# Patient Record
Sex: Male | Born: 1975 | Race: White | Hispanic: No | Marital: Married | Smoking: Former smoker
Health system: Southern US, Community
[De-identification: ages and names within clinical notes are randomized; demographics above are authoritative.]

## PROBLEM LIST (undated history)

## (undated) DIAGNOSIS — M109 Gout, unspecified: Secondary | ICD-10-CM

## (undated) DIAGNOSIS — I509 Heart failure, unspecified: Secondary | ICD-10-CM

## (undated) DIAGNOSIS — I440 Atrioventricular block, first degree: Secondary | ICD-10-CM

## (undated) DIAGNOSIS — I493 Ventricular premature depolarization: Secondary | ICD-10-CM

## (undated) DIAGNOSIS — I428 Other cardiomyopathies: Secondary | ICD-10-CM

## (undated) DIAGNOSIS — Z9289 Personal history of other medical treatment: Secondary | ICD-10-CM

## (undated) DIAGNOSIS — I4892 Unspecified atrial flutter: Secondary | ICD-10-CM

## (undated) DIAGNOSIS — F419 Anxiety disorder, unspecified: Secondary | ICD-10-CM

## (undated) DIAGNOSIS — E785 Hyperlipidemia, unspecified: Secondary | ICD-10-CM

## (undated) DIAGNOSIS — I4891 Unspecified atrial fibrillation: Secondary | ICD-10-CM

## (undated) DIAGNOSIS — K219 Gastro-esophageal reflux disease without esophagitis: Secondary | ICD-10-CM

## (undated) DIAGNOSIS — I471 Supraventricular tachycardia: Secondary | ICD-10-CM

## (undated) DIAGNOSIS — R590 Localized enlarged lymph nodes: Secondary | ICD-10-CM

## (undated) DIAGNOSIS — I4719 Other supraventricular tachycardia: Secondary | ICD-10-CM

## (undated) HISTORY — DX: Unspecified atrial fibrillation: I48.91

## (undated) HISTORY — DX: Hyperlipidemia, unspecified: E78.5

## (undated) HISTORY — DX: Gout, unspecified: M10.9

## (undated) HISTORY — PX: ROOT CANAL: SHX2363

## (undated) HISTORY — DX: Other supraventricular tachycardia: I47.19

## (undated) HISTORY — DX: Localized enlarged lymph nodes: R59.0

## (undated) HISTORY — PX: PACEMAKER IMPLANT: EP1218

## (undated) HISTORY — PX: CARDIAC DEFIBRILLATOR PLACEMENT: SHX171

## (undated) HISTORY — DX: Other cardiomyopathies: I42.8

## (undated) HISTORY — DX: Atrioventricular block, first degree: I44.0

## (undated) HISTORY — DX: Supraventricular tachycardia: I47.1

## (undated) HISTORY — DX: Ventricular premature depolarization: I49.3

## (undated) HISTORY — DX: Personal history of other medical treatment: Z92.89

## (undated) HISTORY — DX: Unspecified atrial flutter: I48.92

## (undated) HISTORY — PX: ATRIAL FIBRILLATION ABLATION: SHX5732

## (undated) HISTORY — PX: MOUTH SURGERY: SHX715

---

## 2010-04-25 ENCOUNTER — Ambulatory Visit (HOSPITAL_COMMUNITY): Admission: RE | Admit: 2010-04-25 | Payer: Self-pay | Source: Home / Self Care | Admitting: Family Medicine

## 2010-04-25 ENCOUNTER — Ambulatory Visit: Admit: 2010-04-25 | Payer: Self-pay

## 2010-04-27 ENCOUNTER — Ambulatory Visit (HOSPITAL_COMMUNITY): Payer: BC Managed Care – PPO | Attending: Family Medicine

## 2010-04-27 DIAGNOSIS — I079 Rheumatic tricuspid valve disease, unspecified: Secondary | ICD-10-CM | POA: Insufficient documentation

## 2010-04-27 DIAGNOSIS — E785 Hyperlipidemia, unspecified: Secondary | ICD-10-CM | POA: Insufficient documentation

## 2010-04-27 DIAGNOSIS — I059 Rheumatic mitral valve disease, unspecified: Secondary | ICD-10-CM | POA: Insufficient documentation

## 2010-04-27 DIAGNOSIS — R9431 Abnormal electrocardiogram [ECG] [EKG]: Secondary | ICD-10-CM | POA: Insufficient documentation

## 2010-07-18 ENCOUNTER — Other Ambulatory Visit: Payer: Self-pay | Admitting: Family Medicine

## 2010-07-18 ENCOUNTER — Ambulatory Visit
Admission: RE | Admit: 2010-07-18 | Discharge: 2010-07-18 | Disposition: A | Discharge status: Home / Self Care | Payer: BC Managed Care – PPO | Source: Ambulatory Visit | Attending: Family Medicine | Admitting: Family Medicine

## 2010-07-18 DIAGNOSIS — R52 Pain, unspecified: Secondary | ICD-10-CM

## 2010-12-09 ENCOUNTER — Ambulatory Visit (INDEPENDENT_AMBULATORY_CARE_PROVIDER_SITE_OTHER): Payer: BC Managed Care – PPO | Admitting: Cardiovascular Disease

## 2010-12-09 ENCOUNTER — Encounter: Payer: Self-pay | Admitting: Cardiovascular Disease

## 2010-12-09 VITALS — BP 142/90 | HR 50 | Ht 74.0 in | Wt 248.0 lb

## 2010-12-09 DIAGNOSIS — I4892 Unspecified atrial flutter: Secondary | ICD-10-CM

## 2010-12-09 LAB — CBC WITH DIFFERENTIAL/PLATELET
Basophils Absolute: 0 10*3/uL (ref 0.0–0.1)
Eosinophils Absolute: 0.2 10*3/uL (ref 0.0–0.7)
Eosinophils Relative: 2.3 % (ref 0.0–5.0)
MCV: 91.3 fl (ref 78.0–100.0)
Monocytes Absolute: 0.6 10*3/uL (ref 0.1–1.0)
Neutrophils Relative %: 70.8 % (ref 43.0–77.0)
Platelets: 251 10*3/uL (ref 150.0–400.0)
WBC: 7.4 10*3/uL (ref 4.5–10.5)

## 2010-12-09 MED ORDER — DABIGATRAN ETEXILATE MESYLATE 150 MG PO CAPS: 150.0000 mg | ORAL_CAPSULE | Freq: Two times a day (BID) | ORAL | Status: DC

## 2010-12-09 NOTE — Assessment & Plan Note (Signed)
The patient has a new diagnosis of atrial flutter. It is unusual that he has such a slow ventricular rate. He appears to have minimal symptoms with this. I suspect this is related to alcohol. He has a mild wall motion abnormality on echo but he does not appear to have significant structural heart disease. His atrial flutter is an atypical pattern and he may be a candidate for flutter ablation. Cardioversion would be another consideration. I think he should be evaluated by one of our electrophysiologists to discuss either approach. The patient will be started on pradaxa for anticoagulation 150 mg twice daily. He will be seen by Dr. [name] next week for further discussion of treatment options. We discussed the importance of alcohol cessation today.

## 2010-12-09 NOTE — Patient Instructions (Signed)
Your physician recommends that you schedule a follow-up appointment next week with Dr. [name]  Your physician has recommended you make the following change in your medication: Start Pradaxa 150 mg by mouth twice daily

## 2010-12-09 NOTE — Progress Notes (Signed)
HPI:  This is a 35 year old gentleman presenting for initial evaluation of atrial flutter. The patient has no history of cardiac problems. He was seen in routine followup by Dr. Duaine Dredge and this morning and was noted to be in atrial flutter with slow ventricular response. He has complained of palpitations for many years. He has some fatigue but no specific complaints. He denies chest pain, dyspnea, edema, orthopnea, or PND. He has been under a lot of stress and he performed an abandoned works late many nights. He binge drinks alcohol.  He has no lightheadedness or syncope. He has no other complaints at present.  The patient has lost about 12 pounds with an exercise program. He is jogging over 2 miles 5 days per week. He has no symptoms with exertion.  Outpatient Encounter Prescriptions as of 12/09/2010  Medication Sig Dispense Refill  . atorvastatin (LIPITOR) 40 MG tablet Take 40 mg by mouth daily.        [name] b complex vitamins capsule Take 1 capsule by mouth daily.        . Fish Oil-Cholecalciferol (FISH OIL + D3) 1200-1000 MG-UNIT CAPS Take by mouth. 2 tabs at qhs       . Multiple Vitamin (MULTI-VITAMIN PO) Take by mouth. 1 tab daily       . dabigatran (PRADAXA) 150 MG CAPS Take 1 capsule (150 mg total) by mouth every 12 (twelve) hours.  60 capsule  6    Review of patient's allergies indicates no known allergies.  Past medical history: Positive for hyperlipidemia.  Past Surgical History  Procedure Date  . 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 He is married with one 76-year-old daughter. He smokes intermittently but is not a regular daily smoker. He drinks alcohol heavily. He does not drink every day but when he performs he drinks over 6 beers.  He smokes marijuana on occasion but uses no other drugs. He specifically denies cocaine.  Family History  Problem Relation Age of Onset  . Heart disease Mother     ROS: General: no  fevers/chills/night sweats Eyes: no blurry vision, diplopia, or amaurosis ENT: no sore throat or hearing loss Resp: no cough, wheezing, or hemoptysis CV: no edema or chest pain GI: no abdominal pain, nausea, vomiting, diarrhea, or constipation GU: no dysuria, frequency, or hematuria Skin: no rash Neuro: no headache, numbness, tingling, or weakness of extremities Musculoskeletal: no joint pain or swelling Heme: no bleeding, DVT, or easy bruising Endo: no polydipsia or polyuria  BP 142/90  Pulse 50  Ht 6\' 2"  (1.88 m)  Wt 248 lb (112.492 kg)  BMI 31.84 kg/m2  PHYSICAL EXAM: Pt is alert and oriented, WD, WN, Overweight male in no distress. HEENT: normal Neck: JVP normal. Carotid upstrokes normal without bruits. No thyromegaly. Lungs: equal expansion, clear bilaterally CV: Apex is discrete and nondisplaced, irregularly irregular without murmur or gallop Abd: soft, NT, +BS, no bruit, no hepatosplenomegaly Back: no CVA tenderness Ext: no C/C/E        Femoral pulses 2+= without bruits        DP/PT pulses intact and = Skin: warm and dry without rash Neuro: CNII-XII intact             Strength intact = bilaterally  EKG: atrial flutter with slow ventricular response heart rate 54 beats per minute  ASSESSMENT AND PLAN:

## 2010-12-13 ENCOUNTER — Encounter: Payer: Self-pay | Admitting: Internal Medicine

## 2010-12-13 ENCOUNTER — Ambulatory Visit (INDEPENDENT_AMBULATORY_CARE_PROVIDER_SITE_OTHER): Payer: BC Managed Care – PPO | Admitting: Internal Medicine

## 2010-12-13 DIAGNOSIS — I4892 Unspecified atrial flutter: Secondary | ICD-10-CM

## 2010-12-13 NOTE — Patient Instructions (Signed)
Your physician has recommended that you have a Cardioversion (DCCV). Electrical Cardioversion uses a jolt of electricity to your heart either through paddles or wired patches attached to your chest. This is a controlled, usually prescheduled, procedure. Defibrillation is done under light anesthesia in the hospital, and you usually go home the day of the procedure. This is done to get your heart back into a normal rhythm. You are not awake for the procedure. Please see the instruction sheet given to you today.  Patient will call back to schedule with either Dr Excell Seltzer or Dr [name] ---needs to be on Pradaxa for 21days f

## 2010-12-14 NOTE — Assessment & Plan Note (Signed)
[facility]                        ELECTROPHYSIOLOGY OFFICE NOTE  RAMIREZ, FULLBRIGHT                         MRN:          [id number] DATE:12/13/2010                            DOB:          1975-07-18   REFERRING PHYSICIAN:  Veverly Fells. Excell Seltzer, MD  INDICATIONS FOR CONSULTATION:  Evaluation of atrial flutter.  HISTORY OF PRESENT ILLNESS:  The patient is a very pleasant 35 year old man whose health has been otherwise good.  He has dyslipidemia.  The patient was in his usual state of health when he saw his primary physician and he was found to be bradycardic on examination, and subsequent electrocardiogram demonstrated atrial flutter with a slow ventricular response.  He saw my partner Dr. Tonny Bollman several days ago and is referred now for additional evaluation.  The patient has atrial flutter with a controlled ventricular response.  He has minimal if at all symptomatic.  The patient denies any history of syncope and has no chest pain or shortness of breath.  He does note that with excessive physical exertion, he will get fatigued and this is perhaps worse than it had been previously.  He denies peripheral edema.  He does admit to indiscretion with alcohol and at times drinks heavily.  He occasionally smokes marijuana but denies any stimulant or other recreational drug use.  His past medical history is notable for dyslipidemia.  PAST SURGICAL HISTORY:  Notable for root canal surgery.  His medications include Lipitor 40 a day, multiple vitamins, and Pradaxa 150 mg one tablet twice daily.  His additional social history is notable that the patient exercises several times a week, jogging up to 2 miles a day.  With exercise, he notes that he has lost over 10 pounds.  His family history is negative for premature coronary artery disease.  Social history is previously been limited to the patient is married.  He works as an Tree surgeon in a  band.  REVIEW OF SYSTEMS:  All systems reviewed and negative except as noted in the HPI.  PHYSICAL EXAMINATION:  GENERAL:  He is a pleasant well-appearing young man in no distress. VITAL SIGNS:  Blood pressure was 130/80, the pulse 42 and irregular, the respirations were 18, temperature is 98, weight was 245 pounds. HEENT:  Normocephalic and atraumatic.  Pupils are equal and round. Oropharynx moist.  Sclerae anicteric. NECK:  No jugular distention.  There is no thyromegaly.  Trachea is midline.  Carotid 2+ and symmetric. LUNGS:  Clear bilaterally to auscultation.  No wheezes, rales, or rhonchi are present.  There is no increased work of breathing. CARDIOVASCULAR:  Regular rate and rhythm.  Normal S1 and S2.  No murmurs, rubs, or gallops. ABDOMINAL:  Soft and nontender.  There is no organomegaly.  The bowel sounds are present.  No rebound or guarding. EXTREMITIES:  No cyanosis, clubbing, or edema.  Pulses are 2+ and symmetric. NEUROLOGIC:  Alert and oriented x3.  Cranial nerves are intact. Strength is 5/5 and symmetric.  LABORATORY DATA:  The EKG demonstrates atrial flutter with controlled ventricular response and PVCs in a bigeminal fashion.  IMPRESSION: 1. Atrial flutter, minimally  symptomatic. 2. Bradycardia, minimally symptomatic. 3. History of polysubstance abuse.  DISCUSSION:  I have discussed the treatment options with the patient. He has now been on Pradaxa for several days.  Because of his alcohol abuse, I have recommended that he stop alcohol altogether.  After he has been therapeutically anticoagulated for over 3 weeks, we will plan to proceed with DC cardioversion.  If after cardioversion, the patient develops recurrent atrial flutter, then strong consideration for catheter ablation would be warranted.  However, because he has potential reversible cause of atrial flutter, specifically alcohol consumption in excess, I think it would be reasonable to let him prove to  [place] that he has persistent atrial flutter prior to recommending catheter ablation.    Doylene Canning. [name], MD    GWT/MedQ  DD: 12/13/2010  DT: 12/14/2010  Job #: [id number]  cc:   Mosetta Putt, M.D.

## 2010-12-14 NOTE — Progress Notes (Signed)
See dictation

## 2010-12-16 ENCOUNTER — Telehealth: Payer: Self-pay | Admitting: *Deleted

## 2010-12-16 DIAGNOSIS — I4891 Unspecified atrial fibrillation: Secondary | ICD-10-CM

## 2010-12-16 NOTE — Telephone Encounter (Signed)
Ms Schoof calling to sched cardioversion for her husband--she is thinking about Oct. 5th--please call her back with a date--he has not been on pradaxa for 3 weeks yet--thanks nt

## 2010-12-17 NOTE — Telephone Encounter (Signed)
12/30/10 pt will have been on Pradaxa for 21 days Can we go ahead with DCCV?

## 2010-12-19 NOTE — Telephone Encounter (Signed)
01/03/11 for DCCV--after 8:30am

## 2010-12-19 NOTE — Telephone Encounter (Signed)
Per Zella Ball calling back to speak with nurse.

## 2010-12-20 ENCOUNTER — Telehealth: Payer: Self-pay | Admitting: *Deleted

## 2010-12-20 NOTE — Telephone Encounter (Signed)
Left message to let pt know cardioversion scheduled for  01/03/11  with Dr Gala Romney  ,also need to give pt instructions as well as schedule  pre procedure labs.

## 2010-12-22 NOTE — Telephone Encounter (Signed)
Pt's wife calling back re scheduling cardioversion, has a meeting and can be reached after 4p at [phone], looking at 01-05-11, has been trying to schedule since Tuesday, would like a call today

## 2010-12-22 NOTE — Telephone Encounter (Signed)
Addended by: Dennis Bast F on: 12/22/2010 05:15 PM   Modules accepted: Orders

## 2010-12-27 ENCOUNTER — Other Ambulatory Visit (INDEPENDENT_AMBULATORY_CARE_PROVIDER_SITE_OTHER): Payer: BC Managed Care – PPO | Admitting: *Deleted

## 2010-12-27 DIAGNOSIS — I4891 Unspecified atrial fibrillation: Secondary | ICD-10-CM

## 2010-12-27 LAB — CBC WITH DIFFERENTIAL/PLATELET
Basophils Relative: 0.4 % (ref 0.0–3.0)
Eosinophils Absolute: 0.2 10*3/uL (ref 0.0–0.7)
Hemoglobin: 15.7 g/dL (ref 13.0–17.0)
MCHC: 34.2 g/dL (ref 30.0–36.0)
MCV: 91.3 fl (ref 78.0–100.0)
Monocytes Absolute: 0.6 10*3/uL (ref 0.1–1.0)
Neutro Abs: 5.2 10*3/uL (ref 1.4–7.7)
RBC: 5.03 Mil/uL (ref 4.22–5.81)

## 2010-12-27 LAB — BASIC METABOLIC PANEL
CO2: 27 mEq/L (ref 19–32)
Chloride: 107 mEq/L (ref 96–112)
Creatinine, Ser: 0.8 mg/dL (ref 0.4–1.5)
Glucose, Bld: 111 mg/dL — ABNORMAL HIGH (ref 70–99)

## 2010-12-29 ENCOUNTER — Telehealth: Payer: Self-pay | Admitting: Internal Medicine

## 2010-12-29 NOTE — Telephone Encounter (Signed)
Pt having a procedure on Tuesday and has questions

## 2010-12-29 NOTE — Telephone Encounter (Signed)
Pt returned your call please call him back

## 2010-12-29 NOTE — Telephone Encounter (Signed)
Returned call to patient and left message for him to return my call

## 2010-12-29 NOTE — Telephone Encounter (Signed)
Called and spoke with patient  All of his questions have been answered regarding his DCCV

## 2011-01-03 ENCOUNTER — Telehealth: Payer: Self-pay | Admitting: Internal Medicine

## 2011-01-03 ENCOUNTER — Ambulatory Visit (HOSPITAL_COMMUNITY)
Admission: RE | Admit: 2011-01-03 | Discharge: 2011-01-03 | Disposition: A | Discharge status: Home / Self Care | Payer: BC Managed Care – PPO | Source: Ambulatory Visit | Attending: Internal Medicine | Admitting: Internal Medicine

## 2011-01-03 DIAGNOSIS — Z01818 Encounter for other preprocedural examination: Secondary | ICD-10-CM | POA: Insufficient documentation

## 2011-01-03 DIAGNOSIS — Z01810 Encounter for preprocedural cardiovascular examination: Secondary | ICD-10-CM | POA: Insufficient documentation

## 2011-01-03 NOTE — Telephone Encounter (Signed)
Pt had cardioversion scheduled today was in rhythm so it was cancelled, wife to call back today to give update on pt's condition and get the next step, also pt has been on pradaxa x 1 month does he need to continue?

## 2011-01-03 NOTE — Telephone Encounter (Signed)
Left a message to call back.

## 2011-01-04 NOTE — Telephone Encounter (Signed)
Pt wife returning call from yesterday. Please call back.

## 2011-01-04 NOTE — Telephone Encounter (Signed)
Spoke with patients wife and let her know I would need to discuss next step as far as follow up and his Pradaxa with Dr [name] tomorrow and return her call afterwards

## 2011-01-06 NOTE — Telephone Encounter (Signed)
Pt's wife called back about his pradaxa

## 2011-01-06 NOTE — Telephone Encounter (Signed)
Discussed with Dr [name] Patient to continue Pradaxa for 3 weeks and get a follow up  Wife aware

## 2011-01-25 ENCOUNTER — Ambulatory Visit (INDEPENDENT_AMBULATORY_CARE_PROVIDER_SITE_OTHER): Payer: BC Managed Care – PPO | Admitting: Internal Medicine

## 2011-01-25 ENCOUNTER — Encounter: Payer: Self-pay | Admitting: Internal Medicine

## 2011-01-25 DIAGNOSIS — I4949 Other premature depolarization: Secondary | ICD-10-CM

## 2011-01-25 DIAGNOSIS — R001 Bradycardia, unspecified: Secondary | ICD-10-CM

## 2011-01-25 DIAGNOSIS — I493 Ventricular premature depolarization: Secondary | ICD-10-CM

## 2011-01-25 DIAGNOSIS — I4892 Unspecified atrial flutter: Secondary | ICD-10-CM

## 2011-01-25 DIAGNOSIS — I498 Other specified cardiac arrhythmias: Secondary | ICD-10-CM

## 2011-01-25 NOTE — Assessment & Plan Note (Signed)
The patient has had recurrent atrial flutter. He remains on anti-coagulation. I have discussed the treatment options with the patient and the risks, goals, benefits, and expectations of catheter ablation have been discussed with the patient. He would like to proceed with catheter ablation and will call us to schedule. A list of dates have been provided the patient so that he may choose which procedure date work best for him

## 2011-01-25 NOTE — Progress Notes (Signed)
HPI [name] returns today for followup. He is a 35 year old man with a history of polysubstance abuse, who has only developed atrial flutter. After his initial visit several weeks ago, we decided to start him on anti-coagulation with plans to proceed with DC cardioversion. The patient was subsequently found to be back in sinus rhythm and is cardioversion was postponed. Since then he has noted occasional palpitations more frequent in the last few days. The patient had previously consumed alcohol in excess and had stopped drinking alcohol completely until 5 days ago when he was out with his brother notes that he drank 8 glasses of beer. He thinks though is not sure that his palpitations increased after this. He denies syncope or peripheral edema. There is no chest pain. His dyspnea with exertion is somewhat increased. No Known Allergies   Current Outpatient Prescriptions  Medication Sig Dispense Refill  . acyclovir (ZOVIRAX) 200 MG capsule Take 200 mg by mouth every 4 (four) hours while awake.        [name] atorvastatin (LIPITOR) 40 MG tablet Take 40 mg by mouth daily.        [name] b complex vitamins capsule Take 1 capsule by mouth daily.        . dabigatran (PRADAXA) 150 MG CAPS Take 150 mg by mouth 2 (two) times daily.        . Fish Oil-Cholecalciferol (FISH OIL + D3) 1200-1000 MG-UNIT CAPS Take by mouth. 2 tabs at qhs       . Multiple Vitamin (MULTI-VITAMIN PO) Take by mouth. 1 tab daily          No past medical history on file.  ROS:   All systems reviewed and negative except as noted in the HPI.   Past Surgical History  Procedure Date  . Root canal      Family History  Problem Relation Age of Onset  . Heart disease Mother      History   Social History  . Marital Status: Married    Spouse Name: N/A    Number of Children: N/A  . Years of Education: N/A   Occupational History  . Not on file.   Social History Main Topics  . Smoking status: Never Smoker   . Smokeless tobacco:  Not on file  . Alcohol Use: Not on file  . Drug Use: Not on file  . Sexually Active: Not on file   Other Topics Concern  . Not on file   Social History Narrative  . No narrative on file     BP 130/80  Pulse 58  Ht 6\' 2"  (1.88 m)  Wt 250 lb 1.9 oz (113.454 kg)  BMI 32.11 kg/m2  Physical Exam:  Well appearing young man, NAD HEENT: Unremarkable Neck:  No JVD, no thyromegally Lymphatics:  No adenopathy Back:  No CVA tenderness Lungs:  Clear with no wheezes, rales, or rhonchi HEART:  Iregular rate and rhythm, no murmurs, no rubs, no clicks Abd:  soft, positive bowel sounds, no organomegally, no rebound, no guarding Ext:  2 plus pulses, no edema, no cyanosis, no clubbing Skin:  No rashes no nodules Neuro:  CN II through XII intact, motor grossly intact  EKG Atrial flutter with slow ventricular response and frequent PVCs in a bigeminal fashion   Assess/Plan:

## 2011-01-25 NOTE — Assessment & Plan Note (Signed)
He has some evidence of fatigue but it is unclear whether this is related to bradycardia, PVCs, or atrial flutter. When he was in sinus rhythm he had one-to-one AV conduction. Once we have returned the patient back to sinus rhythm we'll plan a period of watchful waiting.

## 2011-01-26 ENCOUNTER — Telehealth: Payer: Self-pay | Admitting: Internal Medicine

## 2011-01-26 NOTE — Telephone Encounter (Signed)
Patient calling. Have more question regarding ablation.

## 2011-01-26 NOTE — Telephone Encounter (Signed)
Returned call to patient and answered his questions regarding the ablation  He is going to talk it over with the rest of his family and call us back to schedule if he decides to proceed

## 2011-02-01 ENCOUNTER — Telehealth: Payer: Self-pay | Admitting: Internal Medicine

## 2011-02-01 NOTE — Telephone Encounter (Signed)
New message:  Wife states patient is ready to schedule ablation.  Please call her back with info.

## 2011-02-02 NOTE — Progress Notes (Signed)
Addended by: Micki Riley C on: 02/02/2011 10:50 AM   Modules accepted: Orders

## 2011-02-03 ENCOUNTER — Other Ambulatory Visit: Payer: Self-pay | Admitting: Internal Medicine

## 2011-02-03 ENCOUNTER — Encounter: Payer: Self-pay | Admitting: Internal Medicine

## 2011-02-03 DIAGNOSIS — I4892 Unspecified atrial flutter: Secondary | ICD-10-CM

## 2011-02-03 NOTE — Telephone Encounter (Signed)
NALM

## 2011-02-03 NOTE — Telephone Encounter (Signed)
Pt's wife calling back re scheduling ablation left message for kelly 11-7, not heard back  looking at 11-14 this Wednesday, pls call wife with date if already scheduled or if we can set up for 11-14  [phone]

## 2011-02-03 NOTE — Telephone Encounter (Signed)
LM that someone would call as soon as they could to schedule this ablation

## 2011-02-06 NOTE — Telephone Encounter (Signed)
Pt's wife calling back about scheduling ablation please call back

## 2011-02-06 NOTE — Telephone Encounter (Signed)
Wants to schedule for 02/24/11 per wife  Will call and take care of on Wed and put labs and instructions in

## 2011-02-09 ENCOUNTER — Other Ambulatory Visit: Payer: BC Managed Care – PPO | Admitting: *Deleted

## 2011-02-10 ENCOUNTER — Encounter: Payer: Self-pay | Admitting: *Deleted

## 2011-02-10 ENCOUNTER — Encounter (HOSPITAL_COMMUNITY): Payer: Self-pay

## 2011-02-10 NOTE — Telephone Encounter (Signed)
Labs and orders in as well as new instruction sheet

## 2011-02-14 ENCOUNTER — Other Ambulatory Visit: Payer: Self-pay | Admitting: *Deleted

## 2011-02-14 DIAGNOSIS — I4892 Unspecified atrial flutter: Secondary | ICD-10-CM

## 2011-02-20 ENCOUNTER — Other Ambulatory Visit (INDEPENDENT_AMBULATORY_CARE_PROVIDER_SITE_OTHER): Payer: BC Managed Care – PPO | Admitting: *Deleted

## 2011-02-20 DIAGNOSIS — I4892 Unspecified atrial flutter: Secondary | ICD-10-CM

## 2011-02-20 LAB — BASIC METABOLIC PANEL
Calcium: 9.1 mg/dL (ref 8.4–10.5)
GFR: 125.05 mL/min (ref >60.00)
Sodium: 141 mEq/L (ref 135–145)

## 2011-02-20 LAB — CBC WITH DIFFERENTIAL/PLATELET
Basophils Absolute: 0 10*3/uL (ref 0.0–0.1)
Basophils Relative: 0.2 % (ref 0.0–3.0)
Hemoglobin: 15.2 g/dL (ref 13.0–17.0)
Lymphocytes Relative: 15.9 % (ref 12.0–46.0)
Monocytes Relative: 6.6 % (ref 3.0–12.0)
Neutro Abs: 6.4 10*3/uL (ref 1.4–7.7)
RBC: 4.88 Mil/uL (ref 4.22–5.81)
WBC: 8.6 10*3/uL (ref 4.5–10.5)

## 2011-02-24 ENCOUNTER — Encounter (HOSPITAL_COMMUNITY): Payer: Self-pay | Admitting: General Practice

## 2011-02-24 ENCOUNTER — Other Ambulatory Visit: Payer: Self-pay

## 2011-02-24 ENCOUNTER — Encounter (HOSPITAL_COMMUNITY)
Admission: RE | Disposition: A | Discharge status: Home / Self Care | Payer: Self-pay | Source: Ambulatory Visit | Attending: Internal Medicine

## 2011-02-24 ENCOUNTER — Ambulatory Visit (HOSPITAL_COMMUNITY)
Admission: RE | Admit: 2011-02-24 | Discharge: 2011-02-25 | DRG: 112 | Disposition: A | Discharge status: Home / Self Care | Payer: BC Managed Care – PPO | Source: Ambulatory Visit | Attending: Internal Medicine | Admitting: Internal Medicine

## 2011-02-24 DIAGNOSIS — I5031 Acute diastolic (congestive) heart failure: Secondary | ICD-10-CM | POA: Insufficient documentation

## 2011-02-24 DIAGNOSIS — I4892 Unspecified atrial flutter: Secondary | ICD-10-CM

## 2011-02-24 HISTORY — PX: ATRIAL FLUTTER ABLATION: SHX5733

## 2011-02-24 HISTORY — PX: ATRIAL ABLATION SURGERY: SHX560

## 2011-02-24 HISTORY — DX: Gastro-esophageal reflux disease without esophagitis: K21.9

## 2011-02-24 LAB — BASIC METABOLIC PANEL
Calcium: 8.7 mg/dL (ref 8.4–10.5)
Chloride: 105 mEq/L (ref 96–112)
Creatinine, Ser: 0.67 mg/dL (ref 0.50–1.35)
GFR calc non Af Amer: >90 mL/min (ref >90)
Potassium: 4 mEq/L (ref 3.5–5.1)
Sodium: 140 mEq/L (ref 135–145)

## 2011-02-24 LAB — MAGNESIUM: Magnesium: 2.4 mg/dL (ref 1.5–2.5)

## 2011-02-24 LAB — PROTIME-INR: INR: 1.17 (ref 0.00–1.49)

## 2011-02-24 LAB — APTT: aPTT: 38 seconds — ABNORMAL HIGH (ref 24–37)

## 2011-02-24 SURGERY — ATRIAL FLUTTER ABLATION
Anesthesia: LOCAL

## 2011-02-24 MED ORDER — MIDAZOLAM HCL 5 MG/5ML IJ SOLN
INTRAMUSCULAR | Status: AC
  Filled 2011-02-24: qty 5

## 2011-02-24 MED ORDER — ACYCLOVIR 200 MG PO CAPS: 200.0000 mg | ORAL_CAPSULE | ORAL | Status: DC

## 2011-02-24 MED ORDER — IBUTILIDE FUMARATE 1 MG/10ML IV SOLN
INTRAVENOUS | Status: AC
  Filled 2011-02-24: qty 10

## 2011-02-24 MED ORDER — ACETAMINOPHEN 325 MG PO TABS: 650.0000 mg | ORAL_TABLET | ORAL | Status: DC | PRN

## 2011-02-24 MED ORDER — MAGNESIUM SULFATE 50 % IJ SOLN
INTRAMUSCULAR | Status: AC
  Filled 2011-02-24: qty 2

## 2011-02-24 MED ORDER — HEPARIN (PORCINE) IN NACL 2-0.9 UNIT/ML-% IJ SOLN
INTRAMUSCULAR | Status: AC
  Filled 2011-02-24: qty 1000

## 2011-02-24 MED ORDER — FENTANYL CITRATE 0.05 MG/ML IJ SOLN
INTRAMUSCULAR | Status: AC
  Filled 2011-02-24: qty 2

## 2011-02-24 MED ORDER — DABIGATRAN ETEXILATE MESYLATE 150 MG PO CAPS
150.0000 mg | ORAL_CAPSULE | Freq: Two times a day (BID) | ORAL | Status: DC
  Administered 2011-02-24 – 2011-02-25 (×3): 150 mg via ORAL
  Filled 2011-02-24 (×5): qty 1

## 2011-02-24 MED ORDER — DEXTROSE 5 % IV SOLN
INTRAVENOUS | Status: AC
  Filled 2011-02-24: qty 100

## 2011-02-24 MED ORDER — FLECAINIDE ACETATE 50 MG PO TABS
50.0000 mg | ORAL_TABLET | Freq: Two times a day (BID) | ORAL | Status: DC
  Administered 2011-02-24 – 2011-02-25 (×3): 50 mg via ORAL
  Filled 2011-02-24 (×7): qty 1

## 2011-02-24 MED ORDER — ONDANSETRON HCL 4 MG/2ML IJ SOLN: 4.0000 mg | Freq: Four times a day (QID) | INTRAMUSCULAR | Status: DC | PRN

## 2011-02-24 MED ORDER — SODIUM CHLORIDE 0.9 % IV SOLN: INTRAVENOUS | Status: DC

## 2011-02-24 MED ORDER — BUPIVACAINE HCL (PF) 0.25 % IJ SOLN
INTRAMUSCULAR | Status: AC
  Filled 2011-02-24: qty 30

## 2011-02-24 MED ORDER — FENTANYL CITRATE 0.05 MG/ML IJ SOLN
INTRAMUSCULAR | Status: AC
Start: 2011-02-24 — End: 2011-02-24
  Filled 2011-02-24: qty 2

## 2011-02-24 NOTE — Progress Notes (Signed)
Pt c/o some chest discomfort 2/10 w/o systoms.   EKG to follow.   Also req.  Update C.Berge PA informed.  Will  Cont. To monitor.

## 2011-02-24 NOTE — Plan of Care (Signed)
Problem: Consults Goal: EP Study Patient Education (See Patient Education module for education specifics.) Outcome: Progressing ON going  Problem: Phase I Progression Outcomes Goal: Hemostasis of puncture sites Outcome: Progressing  Rt. Groin site sit small amt of bleeding , subsided with appling pressure.  Rt neck dsg. D&I

## 2011-02-24 NOTE — Progress Notes (Signed)
Pt had small amt. Of bleeding on gauze dsg. To Rt. Goin.  Pt states " I had moved my leg some."  Pressure applied to site by charge nurse.   Notified Margret  Cath lab nurse and also Grier Mitts PA.  Will cont. To monitor ,   Also informed PA pt had bigeminy , & asymptomatic. VS.

## 2011-02-24 NOTE — Op Note (Signed)
EPS/RFA of the atrial flutter isthmus carried out without immediate complication.D# [id number].

## 2011-02-24 NOTE — H&P (Signed)
Admit H & P  Donald Grant returns today for catheter ablation of atrial flutter. He is a 35 year old man with a history of polysubstance abuse, who has only developed atrial flutter. After his initial visit several weeks ago, we decided to start him on anti-coagulation with plans to proceed with DC cardioversion. The patient was subsequently found to be back in sinus rhythm and is cardioversion was postponed. Since then he has noted occasional palpitations more frequent in the last few days. The patient had previously consumed alcohol in excess and had stopped drinking alcohol completely until 5 days ago when he was out with his brother notes that he drank 8 glasses of beer. He thinks though is not sure that his palpitations increased after this. He denies syncope or peripheral edema. There is no chest pain. His dyspnea with exertion is somewhat increased. He now presents for catheter ablation. No Known Allergies  Current Outpatient Prescriptions   Medication  Sig  Dispense  Refill   .  acyclovir (ZOVIRAX) 200 MG capsule  Take 200 mg by mouth every 4 (four) hours while awake.     [name]  atorvastatin (LIPITOR) 40 MG tablet  Take 40 mg by mouth daily.     [name]  b complex vitamins capsule  Take 1 capsule by mouth daily.     .  dabigatran (PRADAXA) 150 MG CAPS  Take 150 mg by mouth 2 (two) times daily.     .  Fish Oil-Cholecalciferol (FISH OIL + D3) 1200-1000 MG-UNIT CAPS  Take by mouth. 2 tabs at qhs     .  Multiple Vitamin (MULTI-VITAMIN PO)  Take by mouth. 1 tab daily      No past medical history on file.  ROS:  All systems reviewed and negative except as noted in the HPI.  Past Surgical History   Procedure  Date   .  Root canal     Family History   Problem  Relation  Age of Onset   .  Heart disease  Mother     History    Social History   .  Marital Status:  Married     Spouse Name:  N/A     Number of Children:  N/A   .  Years of Education:  N/A    Occupational History   .  Not on file.     Social History Main Topics   .  Smoking status:  Never Smoker   .  Smokeless tobacco:  Not on file   .  Alcohol Use:  Not on file   .  Drug Use:  Not on file   .  Sexually Active:  Not on file    Other Topics  Concern   .  Not on file    Social History Narrative   .  No narrative on file    BP 130/80  Pulse 58  Ht 6\' 2"  (1.88 m)  Wt 250 lb 1.9 oz (113.454 kg)  BMI 32.11 kg/m2  Physical Exam:  Well appearing young man, NAD  HEENT: Unremarkable  Neck: No JVD, no thyromegally  Lymphatics: No adenopathy  Back: No CVA tenderness  Lungs: Clear with no wheezes, rales, or rhonchi  HEART: Iregular rate and rhythm, no murmurs, no rubs, no clicks  Abd: soft, positive bowel sounds, no organomegally, no rebound, no guarding  Ext: 2 plus pulses, no edema, no cyanosis, no clubbing  Skin: No rashes no nodules  Neuro: CN II through XII intact, motor  grossly intact  EKG  Atrial flutter with slow ventricular response and frequent PVCs in a bigeminal fashion  A/P 1. Atrial flutter - I have discussed the risks/benefits/goals and expectations of EPS/RFA of atrial flutter and he wishes to proceed.

## 2011-02-24 NOTE — Progress Notes (Signed)
EKG obtained-NSR with psvc with freq. pvcs.

## 2011-02-25 ENCOUNTER — Other Ambulatory Visit: Payer: Self-pay

## 2011-02-25 MED ORDER — FLECAINIDE ACETATE 100 MG PO TABS: 100.0000 mg | ORAL_TABLET | Freq: Two times a day (BID) | ORAL | Status: DC

## 2011-02-25 NOTE — Discharge Summary (Signed)
Physician Discharge Summary  Patient ID: Donald Grant MRN: [id number] DOB/AGE: [birth year] 36 y.o.  Admit date: 02/24/2011 Discharge date: 02/25/2011  Primary Discharge Diagnosis: atrial flutter  Secondary Discharge Diagnosis:  Patient Active Problem List  Diagnoses  . Atrial fib  . Bradycardia   Polysubstance abuse  . PVC's (premature ventricular contractions)   Family Hx of CAD      Significant Diagnostic Studies:EPS/RFA of the atrial flutter isthmus   Hospital Course: He is a 35 year old man with a history of polysubstance abuse, who has only developed atrial flutter. He was scheduled for ablation and came to the hospital for the procedure on 02-24-2011.  He had successful ablation of the atrial flutter without complication but had onset of atrial fib overnight. The rate was controlled. On 02-25-2011, he was seen by Dr [name]. His afib rate was controlled, he is already on Pradaxa. Dr [name] felt that he could be discharged on Flecainide, with close outpatient follow-up.  Labs:   Lab Results  Component Value Date   WBC 8.6 02/20/2011   HGB 15.2 02/20/2011   HCT 45.4 02/20/2011   MCV 93.0 02/20/2011   PLT 242.0 02/20/2011    Lab 02/24/11 1322  NA 140  K 4.0  CL 105  CO2 27  BUN 6  CREATININE 0.67  CALCIUM 8.7  PROT --  BILITOT --  ALKPHOS --  ALT --  AST --  GLUCOSE 95    FOLLOW UP PLANS AND APPOINTMENTS Discharge Orders    Future Orders Please Complete By Expires   Diet - low sodium heart healthy      Increase activity slowly      Call MD for:  redness, tenderness, or signs of infection (pain, swelling, redness, odor or green/yellow discharge around incision site)        Current Discharge Medication List    START taking these medications   Details  flecainide (TAMBOCOR) 100 MG tablet Take 1 tablet (100 mg total) by mouth every 12 (twelve) hours. Qty: 60 tablet, Refills: 11      CONTINUE these medications which have NOT CHANGED   Details    atorvastatin (LIPITOR) 40 MG tablet Take 40 mg by mouth daily.      b complex vitamins capsule Take 1 capsule by mouth daily.     dabigatran (PRADAXA) 150 MG CAPS Take 150 mg by mouth 2 (two) times daily.     Associated Diagnoses: Atrial flutter    fish oil-omega-3 fatty acids 1000 MG capsule Take 2 g by mouth daily.      Multiple Vitamin (MULTI-VITAMIN PO) Take 1 tablet by mouth daily. 1 tab daily    acyclovir (ZOVIRAX) 200 MG capsule Take 200 mg by mouth every 4 (four) hours while awake. For outbreaks.       Follow-up Information    Please follow up. (The Office will call)          BRING ALL MEDICATIONS WITH YOU TO FOLLOW UP APPOINTMENTS  Time spent with patient to include physician time: Signed: Theodore Demark 02/25/2011, 9:46 AM Co-Sign MD

## 2011-02-25 NOTE — Op Note (Signed)
Donald Grant, Donald Grant                ACCOUNT NO.:  [id number]  MEDICAL RECORD NO.:  [id number]  LOCATION:  4710                         FACILITY:  [facility]  PHYSICIAN:  Doylene Canning. [name], MD    DATE OF BIRTH:  1976/03/04  DATE OF PROCEDURE:  02/24/2011 DATE OF DISCHARGE:                              OPERATIVE REPORT   PROCEDURE PERFORMED:  Electrophysiologic study and RF catheter ablation of the atrial flutter isthmus.  INTRODUCTION:  The patient is a 35 year old man who has had recurrent atrial flutter.  He has a long history of alcohol abuse as well. Because of recurrent atrial flutters, he was referred for catheter ablation.  PROCEDURE:  After informed consent was obtained, the patient was taken to the diagnostic EP lab in a fasting state.  After usual preparation and draping, intravenous fentanyl and midazolam was given for sedation. A 6-French hexapolar catheter was inserted percutaneously into the right jugular vein and advanced to coronary sinus.  A 6-French quadripolar catheter was inserted percutaneously in the right femoral vein and advanced to the His-bundle region.  A 7-French 20-pole Halo catheter was inserted percutaneously in the right femoral vein and advanced to the right atrium.  Mapping was carried out and the patient was in atrial fibrillation.  This was a very coarse AFib almost atrial flutter with a change in activation sequences.  Fentanyl and Versed were given, but he was fairly difficult to sedate.  For this reason, the patient was given a 1 mg of ibutilide and 2 g of magnesium sulfate and he spontaneously returned to sinus rhythm.  Multiple attempts to induciate atrial flutter was then carried out, but on the ibutilide atrial flutter could not be induced.  At this point, the decision to proceed with ablation of the atrial flutter isthmus was made.  This was based on the fact the patient has had recurrent atrial flutter, which is typical with positive  flutter waves in lead V1 and negative flutter waves in the inferior leads, with no previous ablation.  The 7-French quadripolar ablation catheter was then inserted percutaneously into the right femoral vein and advanced into the atrial flutter isthmus.  A total of 3 RF energy applications were delivered.  During RF energy application, atrial flutter isthmus block was demonstrated with reversal of activation sequence and long- stemmed A-time in the atrial flutter isthmus.  Pacing from the lateral side demonstrated right-to-left block as well.  At this point, additional pacing was carried out and the patient developed atrial tachycardia.  This tachycardia had a cycle length of around 520 msec and was mapped with the earliest atrial activation to the His-bundle region. Because of the location of this patient's atrial tachycardia and because he had not had this clinically, it was deemed most appropriate not to proceed with catheter ablation out of concerns of developing complete heart block.  At this point, the catheters were removed, hemostasis was assured, and the patient was returned to his room in satisfactory condition.  COMPLICATIONS:  There were no immediate procedure complications.  RESULTS:  A.  Baseline ECG.  Baseline ECG demonstrates atrial fibrillation with a variable ventricular response. B.  Baseline intervals.  HV interval was  42 msec.  The QRS duration was 100 msec.  Following return to sinus rhythm, the AAH interval was 178 msec. C.  Rapid ventricular pacing.  Rapid ventricular pacing demonstrated VA dissociation at baseline. D.  Programmed ventricular stimulation.  Programmed ventricular stimulation demonstrated VA dissociation at baseline 600 msec. E.  Rapid atrial pacing.  Rapid atrial pacing demonstrated AV Wenckebach at 650 msec. F.  Programmed atrial stimulation.  Programmed stimulation demonstrated AV Wenckebach at 650 msec. G.  Arrhythmias observed. 1. Atrial  fibrillation.  Initiation was spontaneous.  Duration was     sustained.  Termination was with ibutilide. 2. Atrial tachycardia.  Initiation was with rapid atrial pacing and     spontaneous.  Duration was sustained.  Termination was spontaneous.     a.     Mapping.  Mapping of the atrial flutter isthmus demonstrated      normal size and orientation.     b.     RF energy application.  Total of 3 RF energy applications      were delivered to the atrial flutter isthmus resulting in atrial      flutter isthmus block being demonstrated by directionally.  CONCLUSION:  This study demonstrates successful electrophysiologic study and catheter ablation of the atrial flutter isthmus.  It also demonstrates atrial fibrillation.  It also demonstrates atrial tachycardia originating from the His-bundle region.  Plan will be that in addition to catheter ablation of the atrial flutter isthmus, the patient will be started on low-dose flecainide with careful observation out of concerns about of developments of symptomatic bradycardia.     Doylene Canning. [name], MD     GWT/MEDQ  D:  02/24/2011  T:  02/24/2011  Job:  [id number]

## 2011-02-25 NOTE — Progress Notes (Signed)
Patient ID: Donald Grant, male   DOB: [birth year], 35 y.o.   MRN: [id number] Subjective:  No chest pain. Upset about going back out of rhythm (atrial fib)  Objective:  Vital Signs in the last 24 hours: Temp:  [97.5 F (36.4 C)-98.7 F (37.1 C)] 97.5 F (36.4 C) (12/01 0551) Pulse Rate:  [67-94] 73  (12/01 0551) Resp:  [20] 20  (12/01 0551) BP: (92-135)/(75-89) 135/85 mmHg (12/01 0551) SpO2:  [95 %-99 %] 99 % (12/01 0551) Weight:  [109.8 kg (242 lb 1 oz)] 242 lb 1 oz (109.8 kg) (12/01 0225)  Intake/Output from previous day: 11/30 0701 - 12/01 0700 In: 100 [P.O.:100] Out: -  Intake/Output from this shift:    Physical Exam: Well appearing NAD HEENT: Unremarkable Neck:  No JVD, no thyromegally Lungs:  Clear with no wheezes. HEART:  IRegular rate rhythm, no murmurs, no rubs, no clicks Abd:  Flat, positive bowel sounds, no organomegally, no rebound, no guarding Ext:  2 plus pulses, no edema, no cyanosis, no clubbing Skin:  No rashes no nodules Neuro:  CN II through XII intact, motor grossly intact  Lab Results: No results found for this basename: WBC:2,HGB:2,PLT:2 in the last 72 hours  Basename 02/24/11 1322  NA 140  K 4.0  CL 105  CO2 27  GLUCOSE 95  BUN 6  CREATININE 0.67   No results found for this basename: TROPONINI:2,CK,MB:2 in the last 72 hours Hepatic Function Panel No results found for this basename: PROT,ALBUMIN,AST,ALT,ALKPHOS,BILITOT,BILIDIR,IBILI in the last 72 hours No results found for this basename: CHOL in the last 72 hours No results found for this basename: PROTIME in the last 72 hours   Cardiac Studies: Tele - NSR then atrial fib at 2300 last night. Assessment/Plan:  Atrial flutter - this has been his clinical arrhythmia and was successfully ablated.  Atrial fib - He has gone into atrial fib last night at 2300. I have given him low dose flecainide and will plan to increase to 100 mg twice daily. He is to be discharged on this medication. He will  need to return to our office in 5 days for an ECG and a trough flecainide level. I would like to see him back in 3 weeks for followup. As his ventricular rate is already controlled. I would not add a beta blocker at this point. Coags - he will continue Pradaxa for now.  LOS: 1 day    [name] 02/25/2011, 9:19 AM

## 2011-02-25 NOTE — Progress Notes (Signed)
Pt showed afib on monitor. EKG completed and afib confirmed. Pt asymptomatic with controlled rate in 70's. MD notified, no new orders.

## 2011-02-27 ENCOUNTER — Telehealth: Payer: Self-pay | Admitting: Internal Medicine

## 2011-02-27 DIAGNOSIS — I4891 Unspecified atrial fibrillation: Secondary | ICD-10-CM

## 2011-02-27 NOTE — Telephone Encounter (Signed)
New Msg: Pt calling to schedule EKG, lab work and 3 week FU with Dr. [name] per pt D/C instructions. The soonest available appt for Dr. [name] is 04/18/11. Please return pt call to discuss further.

## 2011-02-27 NOTE — Telephone Encounter (Signed)
Patient called,was given appointment 03/01/11 at 830am EKG and trough Flecainide level.Patient needs post hospital appointment with Dr. [name] will forward to Dr. Lubertha Basque nurse to help schedule this appointment

## 2011-02-28 NOTE — Telephone Encounter (Signed)
Will schedule patient for last week in Dec  Donald Grant is going to call and schedule appointment

## 2011-03-01 ENCOUNTER — Other Ambulatory Visit: Payer: BC Managed Care – PPO | Admitting: *Deleted

## 2011-03-01 ENCOUNTER — Ambulatory Visit (INDEPENDENT_AMBULATORY_CARE_PROVIDER_SITE_OTHER): Payer: BC Managed Care – PPO

## 2011-03-01 DIAGNOSIS — I4891 Unspecified atrial fibrillation: Secondary | ICD-10-CM

## 2011-03-01 NOTE — Progress Notes (Signed)
Pt here for EKG to follow up s/p flutter ablation.  EKG demonstrates Af Fib with HR of 62 to 66.  Pt very anxious due to his family history and not being in NSR.  Reviewed EKG with Dennis Bast, RN who discussed with pt in detail r/t his concerns.  Will give EKG's to Dr [name] for review on his return to the office.

## 2011-03-01 NOTE — Telephone Encounter (Signed)
Addended by: Sharin Grave on: 03/01/2011 09:04 AM   Modules accepted: Orders

## 2011-03-06 NOTE — Progress Notes (Signed)
UR Completed.  [name] [phone] 03/06/2011  

## 2011-03-09 ENCOUNTER — Other Ambulatory Visit: Payer: Self-pay

## 2011-03-09 MED ORDER — FLECAINIDE ACETATE 100 MG PO TABS: 150.0000 mg | ORAL_TABLET | Freq: Two times a day (BID) | ORAL | Status: DC

## 2011-03-23 ENCOUNTER — Telehealth: Payer: Self-pay | Admitting: Physician Assistant

## 2011-03-23 ENCOUNTER — Encounter (HOSPITAL_COMMUNITY): Payer: Self-pay | Admitting: Pharmacy Technician

## 2011-03-23 ENCOUNTER — Encounter: Payer: Self-pay | Admitting: *Deleted

## 2011-03-23 ENCOUNTER — Ambulatory Visit (INDEPENDENT_AMBULATORY_CARE_PROVIDER_SITE_OTHER): Payer: BC Managed Care – PPO | Admitting: Internal Medicine

## 2011-03-23 ENCOUNTER — Encounter: Payer: Self-pay | Admitting: Internal Medicine

## 2011-03-23 VITALS — BP 125/86 | HR 71 | Wt 251.0 lb

## 2011-03-23 DIAGNOSIS — I4892 Unspecified atrial flutter: Secondary | ICD-10-CM

## 2011-03-23 DIAGNOSIS — I4891 Unspecified atrial fibrillation: Secondary | ICD-10-CM

## 2011-03-23 NOTE — Assessment & Plan Note (Signed)
Doing well s/p CTI ablation but is now in afib. He appears to be only minimally symptomatic with his persistent afib. He is clear that he has been compliant with pradaxa 150mg  BID for more than 3 weeks. His flecainide was increased to 150mg  BID by Dr [name], however he remains in afib.  At this time, I would recommend cardioversion. Risks, benefits, and alternatives to cardioversion were discussed at length with the patient today who wishes to proceed.  We will therefore plan cardioversion at the next available time. If he does not maintain sinus rhythm with ablation, then our options would include tikosyn or catheter ablation.

## 2011-03-23 NOTE — Progress Notes (Signed)
PCP:  [name], MD, MD  The patient presents today for routine electrophysiology followup.  Since his recent flutter ablation by Dr [name], the patient reports doing reasonably well.  He developed afib post procedure and remains in afib at this time.  His flecainide was increased to 150mg  BID.  He reports compliance with pradaxa.   He reports mild palpitations and fatigue but otherwise appears to be doing well.  Today, he denies symptoms of chest pain, shortness of breath, orthopnea, PND, lower extremity edema, dizziness, presyncope, syncope, or neurologic sequela.  The patient feels that he is tolerating medications without difficulties and is otherwise without complaint today.   Past Medical History  Diagnosis Date  . Recurrent upper respiratory infection (URI)   . GERD (gastroesophageal reflux disease)   . Dysrhythmia     atrial flutter  . Dyslipidemia   . Atrial flutter     CTI ablation by Dr [name] 02/24/11  . Atrial tachycardia     mapped to AV node by Dr [name], ablation not performed  . Atrial fibrillation    Past Surgical History  Procedure Date  . Root canal   . Mouth surgery   . Atrial ablation surgery 02/24/11    CTI ablation by Dr [name]    Current Outpatient Prescriptions  Medication Sig Dispense Refill  . atorvastatin (LIPITOR) 40 MG tablet Take 40 mg by mouth daily.        [name] b complex vitamins capsule Take 1 capsule by mouth daily.       . dabigatran (PRADAXA) 150 MG CAPS Take 150 mg by mouth 2 (two) times daily.        . fish oil-omega-3 fatty acids 1000 MG capsule Take 2 g by mouth daily.        . flecainide (TAMBOCOR) 100 MG tablet Take 150 mg by mouth every 12 (twelve) hours.       . Multiple Vitamin (MULITIVITAMIN WITH MINERALS) TABS Take 1 tablet by mouth daily.          No Known Allergies  History   Social History  . Marital Status: Married    Spouse Name: N/A    Number of Children: N/A  . Years of Education: N/A   Occupational History  .  Not on file.   Social History Main Topics  . Smoking status: Passive Smoker  . Smokeless tobacco: Not on file   Comment: have not smoked in 5 months  . Alcohol Use: Yes     drinks beer 3 to 4 times a week  . Drug Use: Yes    Special: Marijuana  . Sexually Active: Yes   Other Topics Concern  . Not on file   Social History Narrative   His additional social history is notable that the patient exercises  several times a week, jogging up to 2 miles a day.  With exercise, he  notes that he has lost over 10 pounds.     Family History  Problem Relation Age of Onset  . Heart disease Mother     ROS-  All systems are reviewed and are negative except as outlined in the HPI above   Physical Exam: Filed Vitals:   03/23/11 1513  BP: 125/86  Pulse: 71  Weight: 251 lb (113.853 kg)    GEN- The patient is well appearing, alert and oriented x 3 today.   Head- normocephalic, atraumatic Eyes-  Sclera clear, conjunctiva pink Ears- hearing intact Oropharynx- clear Neck- supple, no JVP Lymph-  no cervical lymphadenopathy Lungs- Clear to ausculation bilaterally, normal work of breathing Heart- irregular rate and rhythm, no murmurs, rubs or gallops, PMI not laterally displaced GI- soft, NT, ND, + BS Extremities- no clubbing, cyanosis, or edema MS- no significant deformity or atrophy Skin- no rash or lesion Psych- euthymic mood, full affect Neuro- strength and sensation are intact  ekg today reveals coarse afib, v rate 60 bpm, otherwise normal ekg  Assessment and Plan:

## 2011-03-23 NOTE — Patient Instructions (Signed)

## 2011-03-23 NOTE — Telephone Encounter (Signed)
Donald Grant had his Flecainide dose increased from 100 to 150mg  BID. He ran out and his pharmacy had closed. He was able to locate an open pharmacy and the Rx was sent in. He was not having symptoms and was to call back if there were any problems.

## 2011-03-23 NOTE — Assessment & Plan Note (Signed)
Stable s/p CTI ablation

## 2011-03-24 ENCOUNTER — Other Ambulatory Visit: Payer: Self-pay | Admitting: *Deleted

## 2011-03-24 DIAGNOSIS — I4891 Unspecified atrial fibrillation: Secondary | ICD-10-CM

## 2011-03-24 LAB — CBC WITH DIFFERENTIAL/PLATELET
Basophils Absolute: 0 10*3/uL (ref 0.0–0.1)
Eosinophils Absolute: 0.2 10*3/uL (ref 0.0–0.7)
HCT: 45.7 % (ref 39.0–52.0)
Hemoglobin: 15.6 g/dL (ref 13.0–17.0)
Lymphs Abs: 1.6 10*3/uL (ref 0.7–4.0)
MCHC: 34.2 g/dL (ref 30.0–36.0)
Neutro Abs: 4.7 10*3/uL (ref 1.4–7.7)
RDW: 13.2 % (ref 11.5–14.6)

## 2011-03-24 LAB — BASIC METABOLIC PANEL
BUN: 10 mg/dL (ref 6–23)
GFR: 121.25 mL/min (ref >60.00)
Glucose, Bld: 95 mg/dL (ref 70–99)
Potassium: 4.3 mEq/L (ref 3.5–5.1)

## 2011-03-27 NOTE — Progress Notes (Signed)
Addended by: Brien Mates R on: 03/27/2011 11:40 AM   Modules accepted: Orders

## 2011-03-29 ENCOUNTER — Other Ambulatory Visit: Payer: Self-pay

## 2011-03-29 ENCOUNTER — Encounter (HOSPITAL_COMMUNITY): Payer: Self-pay | Admitting: Certified Registered"

## 2011-03-29 ENCOUNTER — Ambulatory Visit (HOSPITAL_COMMUNITY): Payer: BC Managed Care – PPO | Admitting: Certified Registered"

## 2011-03-29 ENCOUNTER — Encounter (HOSPITAL_COMMUNITY): Payer: Self-pay | Admitting: Cardiology

## 2011-03-29 ENCOUNTER — Encounter (HOSPITAL_COMMUNITY)
Admission: RE | Disposition: A | Discharge status: Home / Self Care | Payer: Self-pay | Source: Ambulatory Visit | Attending: Cardiology

## 2011-03-29 ENCOUNTER — Ambulatory Visit (HOSPITAL_COMMUNITY)
Admission: RE | Admit: 2011-03-29 | Discharge: 2011-03-29 | Disposition: A | Discharge status: Home / Self Care | Payer: BC Managed Care – PPO | Source: Ambulatory Visit | Attending: Cardiology | Admitting: Cardiology

## 2011-03-29 DIAGNOSIS — I4891 Unspecified atrial fibrillation: Secondary | ICD-10-CM

## 2011-03-29 DIAGNOSIS — I4892 Unspecified atrial flutter: Secondary | ICD-10-CM | POA: Insufficient documentation

## 2011-03-29 HISTORY — PX: CARDIOVERSION: SHX1299

## 2011-03-29 HISTORY — PX: CARDIOVERSION (BEDSIDE): CAR31

## 2011-03-29 SURGERY — CARDIOVERSION
Anesthesia: Monitor Anesthesia Care | Wound class: Clean

## 2011-03-29 MED ORDER — PROPOFOL 10 MG/ML IV EMUL
INTRAVENOUS | Status: DC | PRN
  Administered 2011-03-29: 300 mg via INTRAVENOUS
  Administered 2011-03-29: 100 mg via INTRAVENOUS

## 2011-03-29 MED ORDER — SODIUM CHLORIDE 0.9 % IV SOLN
INTRAVENOUS | Status: DC | PRN
  Administered 2011-03-29: 11:00:00 via INTRAVENOUS

## 2011-03-29 MED ORDER — SODIUM CHLORIDE 0.9 % IV SOLN
INTRAVENOUS | Status: DC
Start: 2011-03-29 — End: 2011-03-29

## 2011-03-29 MED ORDER — SODIUM CHLORIDE 0.9 % IJ SOLN: 3.0000 mL | Freq: Two times a day (BID) | INTRAMUSCULAR | Status: DC

## 2011-03-29 MED ORDER — SODIUM CHLORIDE 0.9 % IV SOLN: 250.0000 mL | INTRAVENOUS | Status: DC

## 2011-03-29 MED ORDER — HYDROCORTISONE 1 % EX CREA
1.0000 "application " | TOPICAL_CREAM | Freq: Three times a day (TID) | CUTANEOUS | Status: DC | PRN
  Filled 2011-03-29 (×2): qty 28

## 2011-03-29 MED ORDER — SODIUM CHLORIDE 0.9 % IJ SOLN: 3.0000 mL | INTRAMUSCULAR | Status: DC | PRN

## 2011-03-29 NOTE — Anesthesia Postprocedure Evaluation (Signed)
  Anesthesia Post-op Note  Patient: Donald Grant  Procedure(s) Performed:  CARDIOVERSION  Patient Location: PACU and Short Stay  Anesthesia Type: General  Level of Consciousness: awake, alert  and oriented  Airway and Oxygen Therapy: Patient Spontanous Breathing  Post-op Pain: none  Post-op Assessment: Post-op Vital signs reviewed and Patient's Cardiovascular Status Stable  Post-op Vital Signs: stable  Complications: No apparent anesthesia complications

## 2011-03-29 NOTE — Procedures (Signed)
Electrical Cardioversion Procedure Note Donald Grant [id number] 20-Jul-1975  Procedure: Electrical Cardioversion Indications:  Atrial Flutter  Procedure Details Consent: Risks of procedure as well as the alternatives and risks of each were explained to the (patient/caregiver).  Consent for procedure obtained. Time Out: Verified patient identification, verified procedure, site/side was marked, verified correct patient position, special equipment/implants available, medications/allergies/relevent history reviewed, required imaging and test results available.  Performed  Patient placed on cardiac monitor, pulse oximetry, supplemental oxygen as necessary.  Sedation given: diprovan by anesthesia Pacer pads placed anterior and posterior chest.  Cardioverted Synchronized DCCV with 120 joules, 150 joules and 200 joules unsuccessful. time(s).  Cardioverted at biphasic.  Evaluation Findings: Post procedure EKG shows: Atrial Flutter Complications: None Patient did tolerate procedure well.  Discussed with Dr Donald Grant. Plan continue present meds; he will see patient back in 4 weeks and potentially try tikosyn to restore sinus. Donald Grant 03/29/2011, 11:11 AM

## 2011-03-29 NOTE — Transfer of Care (Signed)
Immediate Anesthesia Transfer of Care Note  Patient: Donald Grant  Procedure(s) Performed:  CARDIOVERSION  Patient Location: PACU and Short Stay  Anesthesia Type: General  Level of Consciousness: awake, alert  and oriented  Airway & Oxygen Therapy: Patient Spontanous Breathing and Patient connected to nasal cannula oxygen  Post-op Assessment: Report given to PACU RN, Post -op Vital signs reviewed and stable and Patient moving all extremities X 4  Post vital signs: Reviewed and stable  Complications: No apparent anesthesia complications

## 2011-03-29 NOTE — Anesthesia Preprocedure Evaluation (Addendum)
Anesthesia Evaluation  Patient identified by MRN, date of birth, ID band Patient awake    Reviewed: Allergy & Precautions, H&P , NPO status , Patient's Chart, lab work & pertinent test results  Airway Mallampati: I TM Distance: >3 FB Neck ROM: Full    Dental  (+) Teeth Intact   Pulmonary Recent URI ,  clear to auscultation        Cardiovascular + dysrhythmias Atrial Fibrillation Irregular Normal    Neuro/Psych    GI/Hepatic GERD-  ,  Endo/Other    Renal/GU      Musculoskeletal   Abdominal   Peds  Hematology   Anesthesia Other Findings   Reproductive/Obstetrics                          Anesthesia Physical Anesthesia Plan  ASA: III  Anesthesia Plan: General   Post-op Pain Management:    Induction: Intravenous  Airway Management Planned: Mask  Additional Equipment:   Intra-op Plan:   Post-operative Plan:   Informed Consent: I have reviewed the patients History and Physical, chart, labs and discussed the procedure including the risks, benefits and alternatives for the proposed anesthesia with the patient or authorized representative who has indicated his/her understanding and acceptance.   Dental advisory given  Plan Discussed with: CRNA and Surgeon  Anesthesia Plan Comments:         Anesthesia Quick Evaluation

## 2011-03-30 ENCOUNTER — Encounter (HOSPITAL_COMMUNITY): Payer: Self-pay | Admitting: Cardiology

## 2011-03-31 ENCOUNTER — Telehealth: Payer: Self-pay | Admitting: Internal Medicine

## 2011-03-31 NOTE — Telephone Encounter (Signed)
New Prolbem:    Patient called today because the Cardioversion that he had was unsuccessful and he wanted to know how to proceed from here. He also had some questions about his medications. Please advise.

## 2011-03-31 NOTE — Telephone Encounter (Signed)
Informed him that dr allred/ rn out today and will call back next week to advise of options.

## 2011-04-03 NOTE — Telephone Encounter (Signed)
Continue with Flecainide and get a follow up with Dr Johney Frame  Patient aware

## 2011-04-13 ENCOUNTER — Other Ambulatory Visit: Payer: Self-pay | Admitting: *Deleted

## 2011-04-13 MED ORDER — FLECAINIDE ACETATE 100 MG PO TABS: 150.0000 mg | ORAL_TABLET | Freq: Two times a day (BID) | ORAL | Status: DC

## 2011-05-03 ENCOUNTER — Ambulatory Visit (INDEPENDENT_AMBULATORY_CARE_PROVIDER_SITE_OTHER): Payer: BC Managed Care – PPO | Admitting: Internal Medicine

## 2011-05-03 VITALS — BP 136/74 | Resp 18 | Ht 74.0 in | Wt 251.8 lb

## 2011-05-03 DIAGNOSIS — I4891 Unspecified atrial fibrillation: Secondary | ICD-10-CM

## 2011-05-03 NOTE — Progress Notes (Signed)
PCP:  [name], MD, MD Referring:  Dr [name]  The patient presents today for routine electrophysiology followup.  His recent cardioversion was unsuccessful.  He reports compliance with pradaxa.   He reports mild palpitations and fatigue but otherwise appears to be doing well.  Today, he denies symptoms of chest pain, shortness of breath, orthopnea, PND, lower extremity edema, dizziness, presyncope, syncope, or neurologic sequela.  The patient feels that he is tolerating medications without difficulties and is otherwise without complaint today.   Past Medical History  Diagnosis Date  . Recurrent upper respiratory infection (URI)   . GERD (gastroesophageal reflux disease)   . Dysrhythmia     atrial flutter  . Dyslipidemia   . Atrial flutter     CTI ablation by Dr [name] 02/24/11  . Atrial tachycardia     mapped to AV node by Dr [name], ablation not performed  . Atrial fibrillation    Past Surgical History  Procedure Date  . Root canal   . Mouth surgery   . Atrial ablation surgery 02/24/11    CTI ablation by Dr [name]  . Cardioversion (bedside) 03/29/2011       . Cardioversion 03/29/2011    Procedure: CARDIOVERSION;  Surgeon: [name], MD;  Location: [facility] OR;  Service: Cardiovascular;  Laterality: N/A;    Current Outpatient Prescriptions  Medication Sig Dispense Refill  . atorvastatin (LIPITOR) 40 MG tablet Take 40 mg by mouth daily.        [name] b complex vitamins capsule Take 1 capsule by mouth daily.       . dabigatran (PRADAXA) 150 MG CAPS Take 150 mg by mouth 2 (two) times daily.        . fish oil-omega-3 fatty acids 1000 MG capsule Take 2 g by mouth daily.        . flecainide (TAMBOCOR) 100 MG tablet Take 1.5 tablets (150 mg total) by mouth every 12 (twelve) hours.  90 tablet  3  . Multiple Vitamin (MULITIVITAMIN WITH MINERALS) TABS Take 1 tablet by mouth daily.          No Known Allergies  History   Social History  . Marital Status: Married    Spouse Name: N/A      Number of Children: N/A  . Years of Education: N/A   Occupational History  . Not on file.   Social History Main Topics  . Smoking status: Passive Smoker  . Smokeless tobacco: Not on file   Comment: have not smoked in 5 months  . Alcohol Use: Yes     drinks beer 3 to 4 times a week  . Drug Use: Yes    Special: Marijuana  . Sexually Active: Yes   Other Topics Concern  . Not on file   Social History Narrative   His additional social history is notable that the patient exercises  several times a week, jogging up to 2 miles a day.  With exercise, he  notes that he has lost over 10 pounds.     Family History  Problem Relation Age of Onset  . Heart disease Mother     ROS-  All systems are reviewed and are negative except as outlined in the HPI above   Physical Exam: Filed Vitals:   05/03/11 0915  BP: 136/74  Resp: 18  Height: 6\' 2"  (1.88 m)  Weight: 251 lb 12.8 oz (114.216 kg)    GEN- The patient is well appearing, alert and oriented x 3 today.  Head- normocephalic, atraumatic Eyes-  Sclera clear, conjunctiva pink Ears- hearing intact Oropharynx- clear Neck- supple, no JVP Lymph- no cervical lymphadenopathy Lungs- Clear to ausculation bilaterally, normal work of breathing Heart- irregular rate and rhythm, no murmurs, rubs or gallops, PMI not laterally displaced GI- soft, NT, ND, + BS Extremities- no clubbing, cyanosis, or edema MS- no significant deformity or atrophy Skin- no rash or lesion Psych- euthymic mood, full affect Neuro- strength and sensation are intact  ekg today reveals fine afib, v rate 63 bpm, otherwise normal ekg  Assessment and Plan:

## 2011-05-03 NOTE — Assessment & Plan Note (Signed)
He has persistent afib refractory to flecainide.  His recent cardioversion was unsuccessful.  He is clear that he has been compliant with pradaxa 150mg  BID for more than 3 weeks. Therapeutic strategies for afib including medicine and ablation were discussed in detail with the patient today. Risk, benefits, and alternatives to EP study and radiofrequency ablation for afib were also discussed in detail today. These risks include but are not limited to stroke, bleeding, vascular damage, tamponade, perforation, damage to the esophagus, lungs, and other structures, pulmonary vein stenosis, worsening renal function, and death. The patient understands these risk and wishes to proceed.  He understands that his anticipated success rate is on the order of 60% with 1/4 patients requiring multiple procedures.  We will proceed with ablation at the next available time.

## 2011-05-05 NOTE — Progress Notes (Signed)
Addended by: Judithe Modest D on: 05/05/2011 09:26 AM   Modules accepted: Orders

## 2011-05-09 ENCOUNTER — Telehealth: Payer: Self-pay | Admitting: Internal Medicine

## 2011-05-09 NOTE — Telephone Encounter (Signed)
New Msg: Pt calling wanting to speak with nurse regarding date pt has picked out for his next procedure. Pt also has a couple of questions he would like to discuss with nurse. Please return pt call to discuss further.

## 2011-05-10 NOTE — Telephone Encounter (Signed)
lmom for pt to call me back

## 2011-05-10 NOTE — Telephone Encounter (Signed)
Fu call °Patient returning your call °

## 2011-05-15 NOTE — Telephone Encounter (Signed)
lmom for pt to call me back  Asked him to leave date he wants to do procedure

## 2011-05-17 ENCOUNTER — Encounter: Payer: Self-pay | Admitting: Internal Medicine

## 2011-05-17 NOTE — Telephone Encounter (Signed)
Spoke with patient.  He is not ready to do procedure at present.  He is going to stop Pradaxa at this point and start ASA 325mg  daily.  I will set him up for a follow up appointment in 3 months

## 2011-05-17 NOTE — Telephone Encounter (Signed)
New Problem   Patient calling with question concerning double ablation  On 06/17/11, and risk of stroke if procedure not done.  Please return call to patient at hm# [phone]

## 2011-05-17 NOTE — Telephone Encounter (Signed)
This encounter was created in error - please disregard.

## 2011-05-17 NOTE — Telephone Encounter (Signed)
Donald Grant 05/17/2011 10:36 AM Signed  New Problem  Patient calling with question concerning double ablation On 06/17/11, and risk of stroke if procedure not done. Please return call to patient at hm# [phone]

## 2011-07-12 ENCOUNTER — Encounter: Payer: Self-pay | Admitting: Internal Medicine

## 2011-07-12 ENCOUNTER — Ambulatory Visit (INDEPENDENT_AMBULATORY_CARE_PROVIDER_SITE_OTHER): Payer: BC Managed Care – PPO | Admitting: Internal Medicine

## 2011-07-12 VITALS — BP 138/70 | HR 74 | Resp 18 | Ht 74.0 in | Wt 249.8 lb

## 2011-07-12 DIAGNOSIS — I4892 Unspecified atrial flutter: Secondary | ICD-10-CM

## 2011-07-12 DIAGNOSIS — I493 Ventricular premature depolarization: Secondary | ICD-10-CM

## 2011-07-12 DIAGNOSIS — I4949 Other premature depolarization: Secondary | ICD-10-CM

## 2011-07-12 DIAGNOSIS — I4891 Unspecified atrial fibrillation: Secondary | ICD-10-CM

## 2011-07-12 NOTE — Patient Instructions (Signed)
Your physician has requested that you have an echocardiogram. Echocardiography is a painless test that uses sound waves to create images of your heart. It provides your doctor with information about the size and shape of your heart and how well your heart's chambers and valves are working. This procedure takes approximately one hour. There are no restrictions for this procedure.  Your physician has requested that you have an exercise tolerance test. For further information please visit [email or web address]. Please also follow instruction sheet, as given.  Your physician recommends that you continue on your current medications as directed. Please refer to the Current Medication list given to you today.   

## 2011-07-19 ENCOUNTER — Encounter: Payer: Self-pay | Admitting: Internal Medicine

## 2011-07-19 ENCOUNTER — Ambulatory Visit (INDEPENDENT_AMBULATORY_CARE_PROVIDER_SITE_OTHER): Payer: BC Managed Care – PPO | Admitting: Internal Medicine

## 2011-07-19 DIAGNOSIS — I4892 Unspecified atrial flutter: Secondary | ICD-10-CM

## 2011-07-19 DIAGNOSIS — R079 Chest pain, unspecified: Secondary | ICD-10-CM

## 2011-07-19 NOTE — Assessment & Plan Note (Signed)
The patient presents today for further evaluation of PVCs.  He has a very narrow QRS PVC with a LBB inferior axis.  These PVCs arise from near interventricular septum and likely near the normal conduction system given there narrow appearance.  Prior echo is reviewed. Interestingly, the patient's sister has similar h/o palpitations and a long first degree AV block.  She is followed by Dr Graciela Husbands.  We have discussed her case today also.  Given the patient's mothers sudden death and also the early sudden death of his maternal grandmother, I am very concerned for a familial issue. I will obtain an echo, gxt, and subsequent cardiac MRI to better evaluate for structural abnormalities with Mr [place].  I will also ask his family to see if autopsy records are available for any maternal relatives with sudden death. Pending these findinds, we will consider genetic screening for channelopathies. Presently, I have instructed the patient to avoid rigorous exercise until after his GXT.  IF normal, then he will resume activity. He will contact me immediately should he have syncope or symptoms of ventricular arrhythmias.

## 2011-07-19 NOTE — Progress Notes (Signed)
Exercise Treadmill Test  Pre-Exercise Testing Evaluation Rhythm: Sinus Rhythm, PVC's  Rate: 73   PR:  .28 QRS:  .09  QT:  .39 QTc: .42           Test  Exercise Tolerance Test Ordering MD: [name], MD  Interpreting MD:  [name], MD  Unique Test No: 1   Treadmill:  2  Indication for ETT: chest pain - rule out ischemia  Contraindication to ETT: No   Stress Modality: exercise - treadmill  Cardiac Imaging Performed: non   Protocol: standard Bruce - maximal  Max BP:  173/83  Max MPHR (bpm):  184 85% MPR (bpm):  156  MPHR obtained (bpm):  179 % MPHR obtained:  97  Reached 85% MPHR (min:sec):  9:22 Total Exercise Time (min-sec):  10:30  Workload in METS:  12.50 Borg Scale: 15  Reason ETT Terminated:  patient's desire to stop    ST Segment Analysis At Rest: normal ST segments - no evidence of significant ST depression With Exercise: no evidence of significant ST depression  Other Information Arrhythmia:  See below Angina during ETT:  absent (0) Quality of ETT:  diagnostic  ETT Interpretation:  normal - no evidence of ischemia by ST analysis  Comments: Presented with sinus and PVCs in couplets.  Prolonged first degree AV block.  Pt with afib during exercise. With exercise, PVCs decreased substantially (nearly resolved at max exercise).  Following exercise, he returned to sinus rhythm with PVCs in couplets again.  Recommendations: Resume regular activity Echo and cardiac mri pending Follow-up with me in 4-6 weeks

## 2011-07-19 NOTE — Assessment & Plan Note (Signed)
Now back in sinus Continue ASA No other changes

## 2011-07-19 NOTE — Progress Notes (Signed)
PCP:  [name], MD, MD  The patient presents today for routine electrophysiology followup.  Upon last being seen by me, he had failed cardioversion and he declined ablation for afib.  He was therefore continued on ASA with plans for rate control long term.  This past week, he presented to his primary care physician's office.  He was found to have returned to sinus rhythm but with PVCs in a bigeminy/ couplet pattern.  He also had a profound first degree AV block.  He is therefore referred back to me for further evaluation.  He feels well presently.  He continues to exercise without significant limitation.  Today, he denies symptoms of chest pain, shortness of breath, orthopnea, PND, lower extremity edema, dizziness, presyncope, syncope, or neurologic sequela.  The patient feels that he is tolerating medications without difficulties and is otherwise without complaint today.   Past Medical History  Diagnosis Date  . Recurrent upper respiratory infection (URI)   . GERD (gastroesophageal reflux disease)   . Dysrhythmia     atrial flutter  . Dyslipidemia   . Atrial flutter     CTI ablation by Dr [name] 02/24/11  . Atrial tachycardia     mapped to AV node by Dr [name], ablation not performed  . Atrial fibrillation   . Premature ventricular contraction   . First degree AV block    Past Surgical History  Procedure Date  . Root canal   . Mouth surgery   . Atrial ablation surgery 02/24/11    CTI ablation by Dr [name]  . Cardioversion (bedside) 03/29/2011       . Cardioversion 03/29/2011    Procedure: CARDIOVERSION;  Surgeon: [name], MD;  Location: [facility] OR;  Service: Cardiovascular;  Laterality: N/A;    Current Outpatient Prescriptions  Medication Sig Dispense Refill  . aspirin 325 MG tablet Take 325 mg by mouth daily.      [name] atorvastatin (LIPITOR) 40 MG tablet Take 40 mg by mouth daily.        [name] b complex vitamins capsule Take 1 capsule by mouth daily.       . fish oil-omega-3  fatty acids 1000 MG capsule Take 2 g by mouth daily.        . Multiple Vitamin (MULITIVITAMIN WITH MINERALS) TABS Take 1 tablet by mouth daily.          No Known Allergies  History   Social History  . Marital Status: Married    Spouse Name: N/A    Number of Children: N/A  . Years of Education: N/A   Occupational History  . Not on file.   Social History Main Topics  . Smoking status: Passive Smoker  . Smokeless tobacco: Not on file   Comment: have not smoked in 5 months  . Alcohol Use: Yes     drinks beer 3 to 4 times a week  . Drug Use: Yes    Special: Marijuana  . Sexually Active: Yes   Other Topics Concern  . Not on file   Social History Narrative   His additional social history is notable that the patient exercises  several times a week, jogging up to 2 miles a day.  With exercise, he  notes that he has lost over 10 pounds.     FH- sister has first degree AV block and PVCs, mother died suddenly in her 50s, maternal grandmother died suddenly  In her 30s.  ROS-  All systems are reviewed and are  negative except as outlined in the HPI above   Physical Exam: Filed Vitals:   07/12/11 0857  BP: 138/70  Pulse: 74  Resp: 18  Height: 6\' 2"  (1.88 m)  Weight: 249 lb 12.8 oz (113.309 kg)    GEN- The patient is well appearing, alert and oriented x 3 today.   Head- normocephalic, atraumatic Eyes-  Sclera clear, conjunctiva pink Ears- hearing intact Oropharynx- clear Neck- supple, no JVP Lymph- no cervical lymphadenopathy Lungs- Clear to ausculation bilaterally, normal work of breathing Heart- regular rate and rhythm with frequent ectopy, no murmurs, rubs or gallops, PMI not laterally displaced GI- soft, NT, ND, + BS Extremities- no clubbing, cyanosis, or edema MS- no significant deformity or atrophy Skin- no rash or lesion Psych- euthymic mood, full affect Neuro- strength and sensation are intact  ekg today reveals sinus rhythm with first degree AV block measuring  , PVCs in couplets with a LBB inferior axis (QRS of PVC is )  Assessment and Plan:

## 2011-07-25 ENCOUNTER — Ambulatory Visit (HOSPITAL_COMMUNITY): Payer: BC Managed Care – PPO | Attending: Cardiology

## 2011-07-25 ENCOUNTER — Other Ambulatory Visit: Payer: Self-pay

## 2011-07-25 DIAGNOSIS — Q248 Other specified congenital malformations of heart: Secondary | ICD-10-CM | POA: Insufficient documentation

## 2011-07-25 DIAGNOSIS — E785 Hyperlipidemia, unspecified: Secondary | ICD-10-CM | POA: Insufficient documentation

## 2011-07-25 DIAGNOSIS — R079 Chest pain, unspecified: Secondary | ICD-10-CM | POA: Insufficient documentation

## 2011-07-25 DIAGNOSIS — I4891 Unspecified atrial fibrillation: Secondary | ICD-10-CM

## 2011-07-25 DIAGNOSIS — I059 Rheumatic mitral valve disease, unspecified: Secondary | ICD-10-CM | POA: Insufficient documentation

## 2011-07-25 DIAGNOSIS — I4892 Unspecified atrial flutter: Secondary | ICD-10-CM | POA: Insufficient documentation

## 2011-07-25 DIAGNOSIS — I517 Cardiomegaly: Secondary | ICD-10-CM | POA: Insufficient documentation

## 2011-08-02 ENCOUNTER — Telehealth: Payer: Self-pay | Admitting: Internal Medicine

## 2011-08-02 DIAGNOSIS — I493 Ventricular premature depolarization: Secondary | ICD-10-CM

## 2011-08-02 NOTE — Telephone Encounter (Signed)
Please return call to patient regarding upcoming dentist appnt, he can be reached at [phone].

## 2011-08-02 NOTE — Telephone Encounter (Signed)
Gave patient the results of his echo and ordered his cardiac MRI  No need for SBE prior to dental work

## 2011-08-09 ENCOUNTER — Ambulatory Visit: Payer: BC Managed Care – PPO | Admitting: Internal Medicine

## 2011-08-09 ENCOUNTER — Ambulatory Visit (HOSPITAL_COMMUNITY)
Admission: RE | Admit: 2011-08-09 | Discharge: 2011-08-09 | Disposition: A | Discharge status: Home / Self Care | Payer: BC Managed Care – PPO | Source: Ambulatory Visit | Attending: Internal Medicine | Admitting: Internal Medicine

## 2011-08-09 DIAGNOSIS — I4949 Other premature depolarization: Secondary | ICD-10-CM | POA: Insufficient documentation

## 2011-08-09 DIAGNOSIS — I493 Ventricular premature depolarization: Secondary | ICD-10-CM

## 2011-08-09 MED ORDER — GADOBENATE DIMEGLUMINE 529 MG/ML IV SOLN: 20.0000 mL | Freq: Once | INTRAVENOUS | Status: DC

## 2011-08-23 ENCOUNTER — Ambulatory Visit: Payer: BC Managed Care – PPO | Admitting: Internal Medicine

## 2011-08-28 ENCOUNTER — Ambulatory Visit (INDEPENDENT_AMBULATORY_CARE_PROVIDER_SITE_OTHER): Payer: BC Managed Care – PPO | Admitting: Internal Medicine

## 2011-08-28 ENCOUNTER — Encounter: Payer: Self-pay | Admitting: Internal Medicine

## 2011-08-28 VITALS — BP 130/90 | HR 72 | Ht 74.0 in | Wt 250.8 lb

## 2011-08-28 DIAGNOSIS — I4949 Other premature depolarization: Secondary | ICD-10-CM

## 2011-08-28 DIAGNOSIS — I4891 Unspecified atrial fibrillation: Secondary | ICD-10-CM

## 2011-08-28 DIAGNOSIS — I4892 Unspecified atrial flutter: Secondary | ICD-10-CM

## 2011-08-28 DIAGNOSIS — I493 Ventricular premature depolarization: Secondary | ICD-10-CM

## 2011-08-28 NOTE — Patient Instructions (Signed)
Your physician recommends that you schedule a follow-up appointment in: 4 months with Dr Allred  

## 2011-08-31 NOTE — Assessment & Plan Note (Signed)
Asymptomatic GXT, echo, and MRI are again reviewed.  Though the patient has significant family h/o sudden death and arrhythmias, he has no evidence of structural heart disease.  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  Given first degree AV block and VT with his sister, genetic testing may be reasonable.  I will consult with Dr Graciela Husbands on this matter.

## 2011-08-31 NOTE — Progress Notes (Signed)
PCP:  [name], MD, MD  The patient presents today for routine electrophysiology followup.  He has done very well since his last visit to our clinic.  He continues to exercise without significant limitation.  His continues to maintain sinus rhythm.  He denies symptoms of afib or ventricular arrhythmia. Today, he denies symptoms of chest pain, shortness of breath, orthopnea, PND, lower extremity edema, dizziness, presyncope, syncope, or neurologic sequela.  The patient feels that he is tolerating medications without difficulties and is otherwise without complaint today.   Past Medical History  Diagnosis Date  . Recurrent upper respiratory infection (URI)   . GERD (gastroesophageal reflux disease)   . Dysrhythmia     atrial flutter  . Dyslipidemia   . Atrial flutter     CTI ablation by Dr [name] 02/24/11  . Atrial tachycardia     mapped to AV node by Dr [name], ablation not performed  . Atrial fibrillation   . Premature ventricular contraction   . First degree AV block    Past Surgical History  Procedure Date  . Root canal   . Mouth surgery   . Atrial ablation surgery 02/24/11    CTI ablation by Dr [name]  . Cardioversion (bedside) 03/29/2011       . Cardioversion 03/29/2011    Procedure: CARDIOVERSION;  Surgeon: [name], MD;  Location: [facility] OR;  Service: Cardiovascular;  Laterality: N/A;    Current Outpatient Prescriptions  Medication Sig Dispense Refill  . aspirin 325 MG tablet Take 325 mg by mouth daily.      [name] atorvastatin (LIPITOR) 40 MG tablet Take 40 mg by mouth daily.        [name] b complex vitamins capsule Take 1 capsule by mouth daily.       . fish oil-omega-3 fatty acids 1000 MG capsule Take 2 g by mouth daily.        . Multiple Vitamin (MULITIVITAMIN WITH MINERALS) TABS Take 1 tablet by mouth daily.          No Known Allergies  History   Social History  . Marital Status: Married    Spouse Name: N/A    Number of Children: N/A  . Years of Education: N/A     Occupational History  . Not on file.   Social History Main Topics  . Smoking status: Former Smoker    Quit date: 08/28/2010  . Smokeless tobacco: Not on file   Comment: have not smoked in 5 months  . Alcohol Use: Yes     drinks beer 3 to 4 times a week  . Drug Use: Yes    Special: Marijuana  . Sexually Active: Yes   Other Topics Concern  . Not on file   Social History Narrative   His additional social history is notable that the patient exercises  several times a week, jogging up to 2 miles a day.  With exercise, he  notes that he has lost over 10 pounds.     FH- sister has first degree AV block and PVCs,  The patient's mother mother died suddenly in her 50s.  She apparently had a dilated cardiomyopathy (per pt) and did not have an autopsy obtained.  A maternal aunt died suddenly but per pt had a h/o polysubstance abuse which was felt to be the cause.  She did not have an autopsy.  The patient's maternal grandmother died suddenly  In her 30s and no autopsy was performed per pt.  Physical Exam:  Filed Vitals:   08/28/11 1545  BP: 130/90  Pulse: 72  Height: 6\' 2"  (1.88 m)  Weight: 250 lb 12.8 oz (113.762 kg)    GEN- The patient is well appearing, alert and oriented x 3 today.   Head- normocephalic, atraumatic Eyes-  Sclera clear, conjunctiva pink Ears- hearing intact Oropharynx- clear Neck- supple, no JVP Lymph- no cervical lymphadenopathy Lungs- Clear to ausculation bilaterally, normal work of breathing Heart- regular rate and rhythm with frequent ectopy, no murmurs, rubs or gallops, PMI not laterally displaced GI- soft, NT, ND, + BS Extremities- no clubbing, cyanosis, or edema  ekg today reveals sinus rhythm at 72 bpm, with first degree AV block measuring ,  No PVCs   Cardiac MRI 08/09/11: 1. Normal LV size and systolic function, EF estimated at 55% (unable to calculate by software because of frequent ectopy).  2. No evidence for ARVC. The RV appears  normal in size, morphology, and function.  3. No definite myocardial delayed enhancement (though delayed enhancement images were not of high quality on this study).   GXT 07/19/11- no exercise induced arrhythmias  Assessment and Plan:

## 2011-08-31 NOTE — Assessment & Plan Note (Signed)
Presently in sinus rhythm His CHADSVASC score is 0.  I will therefore not anticoagulation at this time as per guideline recommendations.   

## 2011-10-11 ENCOUNTER — Telehealth: Payer: Self-pay | Admitting: Internal Medicine

## 2011-10-11 NOTE — Telephone Encounter (Signed)
Patient will come by tomorrow for an EKG to determine if he is out of rhythm

## 2011-10-11 NOTE — Telephone Encounter (Signed)
Please return call to patient at [phone], he thinks his heart is out of rhythm again.

## 2011-10-12 ENCOUNTER — Encounter: Payer: Self-pay | Admitting: Internal Medicine

## 2011-10-12 NOTE — Telephone Encounter (Signed)
Patient came in and EKG was done Dr Johney Frame reviewed and is ok  Pt aware

## 2011-10-24 ENCOUNTER — Telehealth: Payer: Self-pay | Admitting: Internal Medicine

## 2011-10-24 NOTE — Telephone Encounter (Signed)
New problem:  Patient in office need approve for tooth removal.

## 2011-10-24 NOTE — Telephone Encounter (Signed)
Lm on general mailbox.  Not sure why they need approval to pull tooth.  He is not on any blood thinners.  Lm to call me back

## 2011-10-24 NOTE — Telephone Encounter (Signed)
Sent Call to [facility] to [place] for [employer]

## 2012-01-01 ENCOUNTER — Encounter: Payer: Self-pay | Admitting: Internal Medicine

## 2012-01-01 ENCOUNTER — Ambulatory Visit (INDEPENDENT_AMBULATORY_CARE_PROVIDER_SITE_OTHER): Payer: BC Managed Care – PPO | Admitting: Internal Medicine

## 2012-01-01 VITALS — BP 130/86 | HR 67 | Ht 74.0 in | Wt 247.0 lb

## 2012-01-01 DIAGNOSIS — R001 Bradycardia, unspecified: Secondary | ICD-10-CM

## 2012-01-01 DIAGNOSIS — I498 Other specified cardiac arrhythmias: Secondary | ICD-10-CM

## 2012-01-01 DIAGNOSIS — I493 Ventricular premature depolarization: Secondary | ICD-10-CM

## 2012-01-01 DIAGNOSIS — I4949 Other premature depolarization: Secondary | ICD-10-CM

## 2012-01-01 DIAGNOSIS — I4891 Unspecified atrial fibrillation: Secondary | ICD-10-CM

## 2012-01-01 NOTE — Progress Notes (Signed)
PCP:  [name], MD  The patient presents today for routine electrophysiology followup.  He has done very well since his last visit to our clinic.  He continues to exercise without significant limitation.  He recently had a holter monitor placed by Dr Duaine Dredge which reveals intermittent afib as well as PVCs and couplets.  There were no sustained arrhythmias.  He denies symptoms of afib or ventricular arrhythmia. Today, he denies symptoms of chest pain, shortness of breath, orthopnea, PND, lower extremity edema, dizziness, presyncope, syncope, or neurologic sequela.  The patient feels that he is tolerating medications without difficulties and is otherwise without complaint today.   Past Medical History  Diagnosis Date  . Recurrent upper respiratory infection (URI)   . GERD (gastroesophageal reflux disease)   . Dysrhythmia     atrial flutter  . Dyslipidemia   . Atrial flutter     CTI ablation by Dr [name] 02/24/11  . Atrial tachycardia     mapped to AV node by Dr [name], ablation not performed  . Atrial fibrillation   . Premature ventricular contraction   . First degree AV block    Past Surgical History  Procedure Date  . Root canal   . Mouth surgery   . Atrial ablation surgery 02/24/11    CTI ablation by Dr [name]  . Cardioversion (bedside) 03/29/2011       . Cardioversion 03/29/2011    Procedure: CARDIOVERSION;  Surgeon: [name], MD;  Location: [facility] OR;  Service: Cardiovascular;  Laterality: N/A;    Current Outpatient Prescriptions  Medication Sig Dispense Refill  . aspirin 325 MG tablet Take 325 mg by mouth daily.      [name] atorvastatin (LIPITOR) 40 MG tablet Take 20 mg by mouth daily.       [name] b complex vitamins capsule Take 1 capsule by mouth daily.       . fish oil-omega-3 fatty acids 1000 MG capsule Take 2 g by mouth daily.        . Multiple Vitamin (MULITIVITAMIN WITH MINERALS) TABS Take 1 tablet by mouth daily.          No Known Allergies  History   Social  History  . Marital Status: Married    Spouse Name: N/A    Number of Children: N/A  . Years of Education: N/A   Occupational History  . Not on file.   Social History Main Topics  . Smoking status: Former Smoker    Quit date: 08/28/2010  . Smokeless tobacco: Not on file   Comment: have not smoked in 5 months  . Alcohol Use: Yes     drinks beer 3 to 4 times a week  . Drug Use: Yes    Special: Marijuana  . Sexually Active: Yes   Other Topics Concern  . Not on file   Social History Narrative   His additional social history is notable that the patient exercises  several times a week, jogging up to 2 miles a day.  With exercise, he  notes that he has lost over 10 pounds.     FH- sister has first degree AV block and PVCs,  The patient's mother mother died suddenly in her 6s.  She apparently had a dilated cardiomyopathy (per pt) and did not have an autopsy obtained.  A maternal aunt died suddenly but per pt had a h/o polysubstance abuse which was felt to be the cause.  She did not have an autopsy.  The patient's maternal  grandmother died suddenly  In her 30s and no autopsy was performed per pt.  Physical Exam: Filed Vitals:   01/01/12 1406  BP: 130/86  Pulse: 67  Height: 6\' 2"  (1.88 m)  Weight: 247 lb (112.038 kg)    GEN- The patient is well appearing, alert and oriented x 3 today.   Head- normocephalic, atraumatic Eyes-  Sclera clear, conjunctiva pink Ears- hearing intact Oropharynx- clear Neck- supple, no JVP Lymph- no cervical lymphadenopathy Lungs- Clear to ausculation bilaterally, normal work of breathing Heart- regular rate and rhythm with frequent ectopy, no murmurs, rubs or gallops, PMI not laterally displaced GI- soft, NT, ND, + BS Extremities- no clubbing, cyanosis, or edema  ekg today reveals sinus rhythm at 65 bpm, with first degree AV block and PVCs in bigeminy   Cardiac MRI 08/09/11: 1. Normal LV size and systolic function, EF estimated at 55% (unable to  calculate by software because of frequent ectopy).  2. No evidence for ARVC. The RV appears normal in size, morphology, and function.  3. No definite myocardial delayed enhancement (though delayed enhancement images were not of high quality on this study).   GXT 07/19/11- no exercise induced arrhythmias  Assessment and Plan:

## 2012-01-01 NOTE — Patient Instructions (Signed)
Your physician wants you to follow-up in: 6 month follow up with Dr Johney Frame Bonita Quin will receive a reminder letter in the mail two months in advance. If you don't receive a letter, please call our office to schedule the follow-up appointment.

## 2012-01-01 NOTE — Assessment & Plan Note (Signed)
Asymptomatic holter from Dr Duaine Dredge is reviewed No changes today He will contact me if he develops symptoms of bradycardia, presyncope, syncope, or other concerns

## 2012-01-01 NOTE — Assessment & Plan Note (Signed)
Asymptomatic GXT, echo, and MRI are again reviewed.  Though the patient has significant family h/o sudden death and arrhythmias, he has no evidence of structural heart disease.  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  Dr Graciela Husbands and I have discussed checking for SCN5A genetic mutation, which is the main culprit for inherited conduction system disease mutations.  At this time, the patient is clear in his decision to decline testing.  He should let me know if he or his sister agree with testing going forward. [name]

## 2012-01-01 NOTE — Assessment & Plan Note (Signed)
Presently in sinus rhythm His CHADSVASC score is 0.  I will therefore not anticoagulation at this time as per guideline recommendations.

## 2012-01-04 ENCOUNTER — Ambulatory Visit: Payer: BC Managed Care – PPO | Admitting: Internal Medicine

## 2012-10-18 ENCOUNTER — Other Ambulatory Visit: Payer: Self-pay | Admitting: Family Medicine

## 2012-10-18 ENCOUNTER — Ambulatory Visit
Admission: RE | Admit: 2012-10-18 | Discharge: 2012-10-18 | Disposition: A | Discharge status: Home / Self Care | Payer: BC Managed Care – PPO | Source: Ambulatory Visit | Attending: Family Medicine | Admitting: Family Medicine

## 2012-10-18 DIAGNOSIS — M25562 Pain in left knee: Secondary | ICD-10-CM

## 2012-11-06 ENCOUNTER — Encounter (INDEPENDENT_AMBULATORY_CARE_PROVIDER_SITE_OTHER): Payer: Self-pay | Admitting: Surgery

## 2012-11-06 ENCOUNTER — Encounter (INDEPENDENT_AMBULATORY_CARE_PROVIDER_SITE_OTHER): Payer: Self-pay

## 2012-11-06 ENCOUNTER — Ambulatory Visit (INDEPENDENT_AMBULATORY_CARE_PROVIDER_SITE_OTHER): Payer: BC Managed Care – PPO | Admitting: Surgery

## 2012-11-06 ENCOUNTER — Telehealth (INDEPENDENT_AMBULATORY_CARE_PROVIDER_SITE_OTHER): Payer: Self-pay | Admitting: Surgery

## 2012-11-06 VITALS — BP 160/100 | HR 56 | Resp 14 | Ht 74.0 in | Wt 237.8 lb

## 2012-11-06 DIAGNOSIS — K626 Ulcer of anus and rectum: Secondary | ICD-10-CM | POA: Insufficient documentation

## 2012-11-06 DIAGNOSIS — K603 Anal fistula: Secondary | ICD-10-CM

## 2012-11-06 MED ORDER — AMOXICILLIN-POT CLAVULANATE 875-125 MG PO TABS: 1.0000 | ORAL_TABLET | Freq: Two times a day (BID) | ORAL | Status: DC

## 2012-11-06 NOTE — Patient Instructions (Signed)
See the Handout(s) we gave you.  Consider surgery.  Please call our office at [phone] if you wish to schedule surgery or if you have further questions / concerns.   If you have worsening pain/drainage, start antibiotics.  If not better, call us to see if surgery needs to be done sooner.  Peri-Rectal Abscess Your caregiver has diagnosed you as having a peri-rectal abscess. This is an infected area near the rectum that is filled with pus. If the abscess is near the surface of the skin, your caregiver may open (incise) the area and drain the pus. HOME CARE INSTRUCTIONS   If your abscess was opened up and drained. A small piece of gauze may be placed in the opening so that it can drain. Do not remove the gauze unless directed by your caregiver.  A loose dressing may be placed over the abscess site. Change the dressing as often as necessary to keep it clean and dry.  After the drain is removed, the area may be washed with a gentle antiseptic (soap) four times per day.  A warm sitz bath, warm packs or heating pad may be used for pain relief, taking care not to burn yourself.  Return for a wound check in 1 day or as directed.  An "inflatable doughnut" may be used for sitting with added comfort. These can be purchased at a drugstore or medical supply house.  To reduce pain and straining with bowel movements, eat a high fiber diet with plenty of fruits and vegetables. Use stool softeners as recommended by your caregiver. This is especially important if narcotic type pain medications were prescribed as these may cause marked constipation.  Only take over-the-counter or prescription medicines for pain, discomfort, or fever as directed by your caregiver. SEEK IMMEDIATE MEDICAL CARE IF:   You have increasing pain that is not controlled by medication.  There is increased inflammation (redness), swelling, bleeding, or drainage from the area.  An oral temperature above 102 F (38.9 C)  develops.  You develop chills or generalized malaise (feel lethargic or feel "washed out").  You develop any new symptoms (problems) you feel may be related to your present problem. Document Released: 03/10/2000 Document Revised: 06/05/2011 Document Reviewed: 03/10/2008 [facility] Patient Information 2014 [place], [place].  GETTING TO GOOD BOWEL HEALTH. Irregular bowel habits such as constipation and diarrhea can lead to many problems over time.  Having one soft bowel movement a day is the most important way to prevent further problems.  The anorectal canal is designed to handle stretching and feces to safely manage our ability to get rid of solid waste (feces, poop, stool) out of our body.  BUT, hard constipated stools can act like ripping concrete bricks and diarrhea can be a burning fire to this very sensitive area of our body, causing inflamed hemorrhoids, anal fissures, increasing risk is perirectal abscesses, abdominal pain/bloating, an making irritable bowel worse.     The goal: ONE SOFT BOWEL MOVEMENT A DAY!  To have soft, regular bowel movements:    Drink at least 8 tall glasses of water a day.     Take plenty of fiber.  Fiber is the undigested part of plant food that passes into the colon, acting s "natures broom" to encourage bowel motility and movement.  Fiber can absorb and hold large amounts of water. This results in a larger, bulkier stool, which is soft and easier to pass. Work gradually over several weeks up to 6 servings a day of fiber (  25g a day even more if needed) in the form of: o Vegetables -- Root (potatoes, carrots, turnips), leafy green (lettuce, salad greens, celery, spinach), or cooked high residue (cabbage, broccoli, etc) o Fruit -- Fresh (unpeeled skin & pulp), Dried (prunes, apricots, cherries, etc ),  or stewed ( applesauce)  o Whole grain breads, pasta, etc (whole wheat)  o Bran cereals    Bulking Agents -- This type of water-retaining fiber generally is easily obtained  each day by one of the following:  o Psyllium bran -- The psyllium plant is remarkable because its ground seeds can retain so much water. This product is available as Metamucil, Konsyl, Effersyllium, Per Diem Fiber, or the less expensive generic preparation in drug and health food stores. Although labeled a laxative, it really is not a laxative.  o Methylcellulose -- This is another fiber derived from wood which also retains water. It is available as Citrucel. o Polyethylene Glycol - and "artificial" fiber commonly called Miralax or Glycolax.  It is helpful for people with gassy or bloated feelings with regular fiber o Flax Seed - a less gassy fiber than psyllium   No reading or other relaxing activity while on the toilet. If bowel movements take longer than 5 minutes, you are too constipated   AVOID CONSTIPATION.  High fiber and water intake usually takes care of this.  Sometimes a laxative is needed to stimulate more frequent bowel movements, but    Laxatives are not a good long-term solution as it can wear the colon out. o Osmotics (Milk of Magnesia, Fleets phosphosoda, Magnesium citrate, MiraLax, GoLytely) are safer than  o Stimulants (Senokot, Castor Oil, Dulcolax, Ex Lax)    o Do not take laxatives for more than 7days in a row.    IF SEVERELY CONSTIPATED, try a Bowel Retraining Program: o Do not use laxatives.  o Eat a diet high in roughage, such as bran cereals and leafy vegetables.  o Drink six (6) ounces of prune or apricot juice each morning.  o Eat two (2) large servings of stewed fruit each day.  o Take one (1) heaping tablespoon of a psyllium-based bulking agent twice a day. Use sugar-free sweetener when possible to avoid excessive calories.  o Eat a normal breakfast.  o Set aside 15 minutes after breakfast to sit on the toilet, but do not strain to have a bowel movement.  o If you do not have a bowel movement by the third day, use an enema and repeat the above steps.    Controlling  diarrhea o Switch to liquids and simpler foods for a few days to avoid stressing your intestines further. o Avoid dairy products (especially milk & ice cream) for a short time.  The intestines often can lose the ability to digest lactose when stressed. o Avoid foods that cause gassiness or bloating.  Typical foods include beans and other legumes, cabbage, broccoli, and dairy foods.  Every person has some sensitivity to other foods, so listen to our body and avoid those foods that trigger problems for you. o Adding fiber (Citrucel, Metamucil, psyllium, Miralax) gradually can help thicken stools by absorbing excess fluid and retrain the intestines to act more normally.  Slowly increase the dose over a few weeks.  Too much fiber too soon can backfire and cause cramping & bloating. o Probiotics (such as active yogurt, Align, etc) may help repopulate the intestines and colon with normal bacteria and calm down a sensitive digestive tract.  Most studies show  it to be of mild help, though, and such products can be costly. o Medicines:   Bismuth subsalicylate (ex. Kayopectate, Pepto Bismol) every 30 minutes for up to 6 doses can help control diarrhea.  Avoid if pregnant.   Loperamide (Immodium) can slow down diarrhea.  Start with two tablets (4mg  total) first and then try one tablet every 6 hours.  Avoid if you are having fevers or severe pain.  If you are not better or start feeling worse, stop all medicines and call your doctor for advice o Call your doctor if you are getting worse or not better.  Sometimes further testing (cultures, endoscopy, X-ray studies, bloodwork, etc) may be needed to help diagnose and treat the cause of the diarrhea. o

## 2012-11-06 NOTE — Progress Notes (Signed)
Subjective:     Patient ID: Donald Grant, male   DOB: Sep 03, 1975, 37 y.o.   MRN: [id number]  HPI  Donald Grant  1975/03/31 [id number]  Patient Care Team: [name], MD as PCP - General (Family Medicine) [name], MD as Consulting Physician (Cardiology)  This patient is a 37 y.o.male who presents today for surgical evaluation at the request of Dr. Duaine Dredge .   Reason for visit: Perisacral abscess.  Question of pilonidal abscess.  Pleasant male with developed pain around the side of his upper buttock about two months ago.  [place]  Has not resolved.  Slightly smaller but not resolved.  Did receive cephalexin.  Refilled it.  Still getting intermittent purulent drainage.  However, pain is much down.  It is now two months.  He saw his primary care physician.  Because of the persistent drainage, surgical consultation was recommended.  Patient has daily bowel movements.  He is rather active between work and his daytime job alone when he tumors with this and.  Does not smoke tobacco.  No history of prior skin infections or MRSA.  No personal nor family history of GI/colon cancer, inflammatory bowel disease, irritable bowel syndrome, allergy such as Celiac Sprue, dietary/dairy problems, colitis, ulcers nor gastritis.  No recent sick contacts/gastroenteritis.  No travel outside the country.  No changes in diet.    Patient Active Problem List   Diagnosis Date Noted  . Atrial fibrillation 03/23/2011  . Bradycardia 01/25/2011  . PVC's (premature ventricular contractions) 01/25/2011  . Atrial flutter 12/09/2010    Past Medical History  Diagnosis Date  . Recurrent upper respiratory infection (URI)   . GERD (gastroesophageal reflux disease)   . Dysrhythmia     atrial flutter  . Dyslipidemia   . Atrial flutter     CTI ablation by Dr [name] 02/24/11  . Atrial tachycardia     mapped to AV node by Dr [name], ablation not performed  . Atrial fibrillation   .  Premature ventricular contraction   . First degree AV block     Past Surgical History  Procedure Laterality Date  . Root canal    . Mouth surgery    . Atrial ablation surgery  02/24/11    CTI ablation by Dr [name]  . Cardioversion (bedside)  03/29/2011       . Cardioversion  03/29/2011    Procedure: CARDIOVERSION;  Surgeon: [name], MD;  Location: [facility] OR;  Service: Cardiovascular;  Laterality: N/A;    History   Social History  . Marital Status: Married    Spouse Name: N/A    Number of Children: N/A  . Years of Education: N/A   Occupational History  . Not on file.   Social History Main Topics  . Smoking status: Former Smoker    Quit date: 08/28/2010  . Smokeless tobacco: Not on file     Comment: have not smoked in 5 months  . Alcohol Use: Yes     Comment: drinks beer 3 to 4 times a week  . Drug Use: Yes    Special: Marijuana  . Sexual Activity: Yes   Other Topics Concern  . Not on file   Social History Narrative   His additional social history is notable that the patient exercises     several times a week, jogging up to 2 miles a day.  With exercise, he     notes that he has lost over 10 pounds.  Family History  Problem Relation Age of Onset  . Heart disease Mother     Current Outpatient Prescriptions  Medication Sig Dispense Refill  . aspirin 325 MG tablet Take 325 mg by mouth daily.      [name] atorvastatin (LIPITOR) 40 MG tablet Take 20 mg by mouth daily.       [name] b complex vitamins capsule Take 1 capsule by mouth daily.       . fish oil-omega-3 fatty acids 1000 MG capsule Take 2 g by mouth daily.        . Multiple Vitamin (MULITIVITAMIN WITH MINERALS) TABS Take 1 tablet by mouth daily.        [name] acyclovir (ZOVIRAX) 400 MG tablet        No current facility-administered medications for this visit.     No Known Allergies  BP 160/100  Pulse 56  Resp 14  Ht 6\' 2"  (1.88 m)  Wt 237 lb 12.8 oz (107.865 kg)  BMI 30.52 kg/m2  Dg Knee Complete 4 Views  Left  10/18/2012   *RADIOLOGY REPORT*  Clinical Data: Pain post trauma  LEFT KNEE - COMPLETE 4+ VIEW  Comparison: None.  Findings:  Frontal, lateral, and bilateral oblique views were obtained.  There is no fracture, dislocation, or effusion.  Joint spaces appear intact.  No erosive change.  IMPRESSION: No abnormality noted.   Original Report Authenticated By: Bretta Bang, M.D.     Review of Systems  Constitutional: Negative for fever, chills and diaphoresis.  HENT: Negative for nosebleeds, sore throat, facial swelling, mouth sores, trouble swallowing and ear discharge.   Eyes: Negative for photophobia, discharge and visual disturbance.  Respiratory: Negative for choking, chest tightness, shortness of breath and stridor.   Cardiovascular: Negative for chest pain and palpitations.  Gastrointestinal: Negative for nausea, vomiting, abdominal pain, diarrhea, constipation, blood in stool, abdominal distention, anal bleeding and rectal pain.  Endocrine: Negative for cold intolerance and heat intolerance.  Genitourinary: Negative for dysuria, urgency, difficulty urinating and testicular pain.  Musculoskeletal: Negative for myalgias, back pain, arthralgias and gait problem.  Skin: Negative for color change, pallor, rash and wound.  Allergic/Immunologic: Negative for environmental allergies and food allergies.  Neurological: Negative for dizziness, speech difficulty, weakness, numbness and headaches.  Hematological: Negative for adenopathy. Does not bruise/bleed easily.  Psychiatric/Behavioral: Negative for hallucinations, confusion and agitation.       Objective:   Physical Exam  Constitutional: He is oriented to person, place, and time. He appears well-developed and well-nourished. No distress.  HENT:  Head: Normocephalic.  Mouth/Throat: Oropharynx is clear and moist. No oropharyngeal exudate.  Eyes: Conjunctivae and EOM are normal. Pupils are equal, round, and reactive to light. No scleral  icterus.  Neck: Normal range of motion. Neck supple. No tracheal deviation present.  Cardiovascular: Normal rate, regular rhythm and intact distal pulses.   Pulmonary/Chest: Effort normal and breath sounds normal. No respiratory distress.  Abdominal: Soft. He exhibits no distension. There is no tenderness. Hernia confirmed negative in the right inguinal area and confirmed negative in the left inguinal area.  Genitourinary:     Exam done with assistance of male Medical Assistant in the room.  Perianal skin clean with good hygiene.  No pruritis.  No external skin tags / hemorrhoids of significance.  No pilonidal disease.  No fissure.    Obvious draining sinus right posterior  Tolerates digital and anoscopic rectal exam.  Normal sphincter tone.  No rectal masses.  No fluctuance.  Hemorrhoidal piles WNL.  No increased drainage upon palpation.   Musculoskeletal: Normal range of motion. He exhibits no tenderness.       Back:  Lymphadenopathy:    He has no cervical adenopathy.       Right: No inguinal adenopathy present.       Left: No inguinal adenopathy present.  Neurological: He is alert and oriented to person, place, and time. No cranial nerve deficit. He exhibits normal muscle tone. Coordination normal.  Skin: Skin is warm and dry. No rash noted. He is not diaphoretic. No erythema. No pallor.  Psychiatric: He has a normal mood and affect. His behavior is normal. Judgment and thought content normal.       Assessment:       The abscess spontaneously drained.  No evidence of pilonidal disease.  Continued perirectal drainage suspicious for fistula.    Plan:     Given the fact we are going to months and he still having purulent drainage, I worry as a fistula.  I recommended examination under anesthesia.  Probable drainage/fistulotomy/left repair of fistula depending on the anatomy.  He was initially disappointed/frustrated on needing operation, but he does understand there really are  not any other options at this point.  I did give him prescription of Augmentin just in case he has worsening pain or flair.  He wishes to delay this a month or two if possible given working conflicts:  The anatomy & physiology of the anorectal region was discussed.  We discussed the pathophysiology of anorectal abscess and fistula.  Differential diagnosis was discussed.  Natural history progression was discussed.   I stressed the importance of a bowel regimen to have daily soft bowel movements to minimize progression of disease.     The patient's condition is not adequately controlled.  Non-operative treatment has not healed the fistula.  Therefore, I recommended examination under anaesthesia to confirm the diagnosis and treat the fistula.  I discussed techniques that may be required such as fistulotomy, ligation by LIFT technique, and/or seton placement.  Benefits & alternatives discussed.  I noted a good likelihood this will help address the problem, but sometimes repeat operations and prolonged healing times may occur.  Risks such as bleeding, pain, recurrence, reoperation, incontinence, heart attack, death, and other risks were discussed.      Educational handouts further explaining the pathology, treatment options, and bowel regimen were given.  The patient expressed understanding & wishes to proceed.  We will work to coordinate surgery for a mutually convenient time.   I am concerned about the health of the patient and the ability to tolerate the operation Given his history of persistent atrial fibrillation on aspirin only.  He has excellent exercise tolerance, so I suspect they will not need to adjust much.  However, I want her input.  He does as well..  Therefore, we will request clearance by cardiology to better assess operative risk & see if a reevaluation, further workup, adjustment to medications, etc is needed.

## 2012-11-06 NOTE — Telephone Encounter (Signed)
Orders being held in surgery scheduling until medical clearance has been received

## 2012-11-13 ENCOUNTER — Encounter (INDEPENDENT_AMBULATORY_CARE_PROVIDER_SITE_OTHER): Payer: Self-pay

## 2012-11-13 ENCOUNTER — Telehealth (INDEPENDENT_AMBULATORY_CARE_PROVIDER_SITE_OTHER): Payer: Self-pay

## 2012-11-13 ENCOUNTER — Telehealth: Payer: Self-pay | Admitting: Internal Medicine

## 2012-11-13 NOTE — Telephone Encounter (Signed)
LMOM for pt to call me so I can let him know we received cardiac clearance from Dr Jenel Lucks office so now we can schedule surgery.

## 2012-11-13 NOTE — Telephone Encounter (Signed)
Pt returned call and Cyndra Numbers advised pt of the clearance.

## 2012-11-13 NOTE — Telephone Encounter (Signed)
Ok to Proceed with Surgery Note faxed to Dr.Steven Gross [phone] 11/13/12/KM

## 2012-12-04 ENCOUNTER — Ambulatory Visit (INDEPENDENT_AMBULATORY_CARE_PROVIDER_SITE_OTHER): Payer: BC Managed Care – PPO | Admitting: Internal Medicine

## 2012-12-04 VITALS — BP 152/88 | HR 54 | Ht 74.0 in | Wt 242.0 lb

## 2012-12-04 DIAGNOSIS — I4949 Other premature depolarization: Secondary | ICD-10-CM

## 2012-12-04 DIAGNOSIS — I493 Ventricular premature depolarization: Secondary | ICD-10-CM

## 2012-12-04 DIAGNOSIS — Z01810 Encounter for preprocedural cardiovascular examination: Secondary | ICD-10-CM

## 2012-12-04 DIAGNOSIS — I4891 Unspecified atrial fibrillation: Secondary | ICD-10-CM

## 2012-12-04 DIAGNOSIS — I10 Essential (primary) hypertension: Secondary | ICD-10-CM

## 2012-12-04 NOTE — Patient Instructions (Addendum)
Your physician wants you to follow-up in: 6 months with Dr. Allred. You will receive a reminder letter in the mail two months in advance. If you don't receive a letter, please call our office to schedule the follow-up appointment.  

## 2012-12-04 NOTE — Progress Notes (Signed)
PCP:  [name], MD  The patient presents today for routine electrophysiology followup.  He has done very well since his last visit to our clinic.  He continues to exercise without significant limitation.  He denies symptoms of afib or ventricular arrhythmia.  He has a perirectal abscess which he says needs to be drained.  Today, he denies symptoms of chest pain, shortness of breath, orthopnea, PND, lower extremity edema, dizziness, presyncope, syncope, or neurologic sequela.  The patient feels that he is tolerating medications without difficulties and is otherwise without complaint today.   Past Medical History  Diagnosis Date  . Recurrent upper respiratory infection (URI)   . GERD (gastroesophageal reflux disease)   . Dysrhythmia     atrial flutter  . Dyslipidemia   . Atrial flutter     CTI ablation by Dr [name] 02/24/11  . Atrial tachycardia     mapped to AV node by Dr [name], ablation not performed  . Atrial fibrillation   . Premature ventricular contraction   . First degree AV block    Past Surgical History  Procedure Laterality Date  . Root canal    . Mouth surgery    . Atrial ablation surgery  02/24/11    CTI ablation by Dr [name]  . Cardioversion (bedside)  03/29/2011       . Cardioversion  03/29/2011    Procedure: CARDIOVERSION;  Surgeon: [name], MD;  Location: [facility] OR;  Service: Cardiovascular;  Laterality: N/A;    Current Outpatient Prescriptions  Medication Sig Dispense Refill  . acyclovir (ZOVIRAX) 400 MG tablet 400 mg as needed.       [name] aspirin 325 MG tablet Take 325 mg by mouth daily.      [name] atorvastatin (LIPITOR) 40 MG tablet Take 20 mg by mouth daily.       [name] b complex vitamins capsule Take 1 capsule by mouth daily.       . fish oil-omega-3 fatty acids 1000 MG capsule Take 2 g by mouth daily.        . Multiple Vitamin (MULITIVITAMIN WITH MINERALS) TABS Take 1 tablet by mouth daily.         No current facility-administered medications for this visit.     No Known Allergies  History   Social History  . Marital Status: Married    Spouse Name: N/A    Number of Children: N/A  . Years of Education: N/A   Occupational History  . Not on file.   Social History Main Topics  . Smoking status: Former Smoker    Quit date: 08/28/2010  . Smokeless tobacco: Not on file     Comment: have not smoked in 5 months  . Alcohol Use: Yes     Comment: drinks beer 3 to 4 times a week  . Drug Use: Yes    Special: Marijuana  . Sexual Activity: Yes   Other Topics Concern  . Not on file   Social History Narrative   His additional social history is notable that the patient exercises     several times a week, jogging up to 2 miles a day.  With exercise, he     notes that he has lost over 10 pounds.     FH- sister has first degree AV block and PVCs,  The patient's mother mother died suddenly in her 66s.  She apparently had a dilated cardiomyopathy (per pt) and did not have an autopsy obtained.  A maternal aunt died  suddenly but per pt had a h/o polysubstance abuse which was felt to be the cause.  She did not have an autopsy.  The patient's maternal grandmother died suddenly  In her 30s and no autopsy was performed per pt.  Physical Exam: Filed Vitals:   12/04/12 1019  BP: 152/88  Pulse: 54  Height: 6\' 2"  (1.88 m)  Weight: 242 lb (109.77 kg)    GEN- The patient is well appearing, alert and oriented x 3 today.   Head- normocephalic, atraumatic Eyes-  Sclera clear, conjunctiva pink Ears- hearing intact Oropharynx- clear Neck- supple, no JVP Lymph- no cervical lymphadenopathy Lungs- Clear to ausculation bilaterally, normal work of breathing Heart- irregular rate and rhythm, no murmurs, rubs or gallops, PMI not laterally displaced GI- soft, NT, ND, + BS Extremities- no clubbing, cyanosis, or edema  ekg today reveals afib with PVCs, V raqte 54 bpm, septal infarct   Cardiac MRI 08/09/11: 1. Normal LV size and systolic function, EF estimated  at 55% (unable to calculate by software because of frequent ectopy).  2. No evidence for ARVC. The RV appears normal in size, morphology, and function.  3. No definite myocardial delayed enhancement (though delayed enhancement images were not of high quality on this study).   GXT 07/19/11- no exercise induced arrhythmias  Assessment and Plan:  1. Paroxysmal atrial fibrillation asymptomatic chads2vasc score is 1.  Continue ASA Would avoid AAD therapy at this time Repeat echo upon return He will continue to work on ETOh cessation which is likely his main trigger for afib.  2. htn bp is elevated today Consider addition of an ARB upon return (these have been shown to reduce atrial scarring related to afib) Given bradycardia/ long first degree AV block I would avoid chronotropic agents   3. PVCs Stable  4. Preoperative clearance Proceed with surgery with usual precautions if medically indicated.  Return in 6 months

## 2012-12-16 ENCOUNTER — Telehealth (INDEPENDENT_AMBULATORY_CARE_PROVIDER_SITE_OTHER): Payer: Self-pay

## 2012-12-16 NOTE — Telephone Encounter (Signed)
Debbie from surgical center calling to verify that it is OK for pt to continue to take the antibiotics he was just prescribed.  Advised that Dr. Michaell Cowing is unavailable today.  Haig Prophet, RN spoke with Dr. Donell Beers about this.  Per Dr. Donell Beers OK to continue.  LMOM for [employer].

## 2012-12-19 ENCOUNTER — Other Ambulatory Visit (INDEPENDENT_AMBULATORY_CARE_PROVIDER_SITE_OTHER): Payer: Self-pay | Admitting: *Deleted

## 2012-12-19 ENCOUNTER — Other Ambulatory Visit (INDEPENDENT_AMBULATORY_CARE_PROVIDER_SITE_OTHER): Payer: Self-pay | Admitting: Surgery

## 2012-12-19 DIAGNOSIS — K612 Anorectal abscess: Secondary | ICD-10-CM

## 2012-12-19 HISTORY — PX: EXAMINATION UNDER ANESTHESIA: SHX1540

## 2012-12-19 MED ORDER — OXYCODONE HCL 5 MG PO TABS: 5.0000 mg | ORAL_TABLET | ORAL | Status: DC | PRN

## 2012-12-24 ENCOUNTER — Ambulatory Visit (INDEPENDENT_AMBULATORY_CARE_PROVIDER_SITE_OTHER): Payer: BC Managed Care – PPO | Admitting: General Surgery

## 2012-12-24 ENCOUNTER — Encounter (INDEPENDENT_AMBULATORY_CARE_PROVIDER_SITE_OTHER): Payer: Self-pay | Admitting: General Surgery

## 2012-12-24 VITALS — BP 116/84 | HR 65 | Temp 97.0°F | Ht 74.0 in | Wt 242.8 lb

## 2012-12-24 DIAGNOSIS — Z4800 Encounter for change or removal of nonsurgical wound dressing: Secondary | ICD-10-CM

## 2012-12-24 NOTE — Progress Notes (Signed)
Patient comes in today S/P EUA-anal fistula by Dr.Gross on 12/19/12 for wound check and dressing change..patient comes in today without any type of packing or dressing over the wound- pt stated that the "string" had fallen out so he had just been using a maxi pad and there was very minimal drainage coming from the incision site..pt's incision site looked very well with zero redness or infection, no N/V or chills, no fevers.  I repacked incision per Alisha with 1/4 iodoform and dry gauze.  Patient tolerated very well and will wait on Alisha to call him back tomorrow with instructions after she talks to SG..patient verbalized agreement with POC at this time

## 2012-12-25 ENCOUNTER — Telehealth (INDEPENDENT_AMBULATORY_CARE_PROVIDER_SITE_OTHER): Payer: Self-pay

## 2012-12-25 NOTE — Telephone Encounter (Signed)
Called pt to make his next nurse visit for Friday 10/3. I also advised pt that once he get repacked on Friday he can leave that packing in until his appt next week with Dr Michaell Cowing. The pt understands. I also advised pt of his path results showing that the sinus tract was cleaned out and no cancer or inflammatory bowel disease seen on pathology per Dr Michaell Cowing. The pt understands.

## 2012-12-27 ENCOUNTER — Encounter (INDEPENDENT_AMBULATORY_CARE_PROVIDER_SITE_OTHER): Payer: Self-pay | Admitting: General Surgery

## 2012-12-27 ENCOUNTER — Ambulatory Visit (INDEPENDENT_AMBULATORY_CARE_PROVIDER_SITE_OTHER): Payer: BC Managed Care – PPO | Admitting: General Surgery

## 2012-12-27 VITALS — BP 114/78 | HR 49 | Temp 97.3°F | Ht 74.0 in | Wt 241.8 lb

## 2012-12-27 DIAGNOSIS — Z4800 Encounter for change or removal of nonsurgical wound dressing: Secondary | ICD-10-CM

## 2012-12-27 NOTE — Progress Notes (Signed)
Patient comes in today for wound check and dressing change per SG..pt S/p EUA-anal fistula on 9/25 by SG..patient was last here on 9/30 for wound check and packing but the patient stated that he really didn't know how long that packing stayed in because he was playing tennis a couple times since the packing was changed and never seen it come out but knew that there was nothing in there.I did let the pt know how important that it was to keep the packing in the wound so that it will heal from the inside out and keep the infection at low risk as possible..patient stated that it was draining a little more than his first visit on the 9/30 but nothing abnormal and there was no odor and I told him that it was coming from his increased activity playing tennis..patient agreed and stated that he was showering and changing the outer dressing BID..I repacked the wound with 1/4 iodoform and dry gauze and told pt to keep his appt with SG on 10/6@9 :00..pt's wound had a little drainage but zero redness, heat or infection..pt verbalized agreement with POC at this time

## 2012-12-30 ENCOUNTER — Encounter (INDEPENDENT_AMBULATORY_CARE_PROVIDER_SITE_OTHER): Payer: Self-pay | Admitting: Surgery

## 2012-12-30 ENCOUNTER — Ambulatory Visit (INDEPENDENT_AMBULATORY_CARE_PROVIDER_SITE_OTHER): Payer: BC Managed Care – PPO | Admitting: Surgery

## 2012-12-30 VITALS — BP 122/74 | HR 64 | Temp 98.0°F | Resp 14 | Ht 74.0 in | Wt 246.4 lb

## 2012-12-30 DIAGNOSIS — K626 Ulcer of anus and rectum: Secondary | ICD-10-CM

## 2012-12-30 NOTE — Progress Notes (Signed)
Subjective:     Patient ID: Donald Grant, male   DOB: [birth year], 37 y.o.   MRN: [id number]  HPI  Donald Grant  Feb 07, 1976 [id number]  Patient Care Team: [name], MD as PCP - General (Family Medicine) [name], MD as Consulting Physician (Cardiology)  Procedure (Date: 12/19/2012):  Examination under anesthesia, excision of chronic anal sinus tract.   Pathology consistent with fistulous-like tract.  No Crohn's.  No cancer.  This patient returns for surgical re-evaluation.  He is feeling better.  Hard to keep any packing in.  No fevers or chills.  Daily bowel movements.  Energy level rather good.  Not on any antibiotics.  Minimal bleeding.  Patient Active Problem List   Diagnosis Date Noted  . Preop cardiovascular exam 12/04/2012  . Essential hypertension 12/04/2012  . 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 Atrial fibrillation 03/23/2011  . Bradycardia 01/25/2011  . PVC's (premature ventricular contractions) 01/25/2011  . Atrial flutter 12/09/2010    Past Medical History  Diagnosis Date  . Recurrent upper respiratory infection (URI)   . GERD (gastroesophageal reflux disease)   . Dysrhythmia     atrial flutter  . Dyslipidemia   . Atrial flutter     CTI ablation by Dr [name] 02/24/11  . Atrial tachycardia     mapped to AV node by Dr [name], ablation not performed  . Atrial fibrillation   . Premature ventricular contraction   . First degree AV block     Past Surgical History  Procedure Laterality Date  . Root canal    . Mouth surgery    . Atrial ablation surgery  02/24/11    CTI ablation by Dr [name]  . Cardioversion (bedside)  03/29/2011       . Cardioversion  03/29/2011    Procedure: CARDIOVERSION;  Surgeon: [name], MD;  Location: [facility] OR;  Service: Cardiovascular;  Laterality: N/A;  . Examination under anesthesia  12/19/12    Anal Fistula    History   Social History  . Marital Status: Married    Spouse Name: N/A      Number of Children: N/A  . Years of Education: N/A   Occupational History  . Not on file.   Social History Main Topics  . Smoking status: Former Smoker    Quit date: 08/28/2010  . Smokeless tobacco: Not on file     Comment: have not smoked in 5 months  . Alcohol Use: Yes     Comment: drinks beer 3 to 4 times a week  . Drug Use: Yes    Special: Marijuana  . Sexual Activity: Yes   Other Topics Concern  . Not on file   Social History Narrative   His additional social history is notable that the patient exercises     several times a week, jogging up to 2 miles a day.  With exercise, he     notes that he has lost over 10 pounds.     Family History  Problem Relation Age of Onset  . Heart disease Mother     Current Outpatient Prescriptions  Medication Sig Dispense Refill  . acyclovir (ZOVIRAX) 400 MG tablet 400 mg as needed.       [name] aspirin 325 MG tablet Take 325 mg by mouth daily.      [name] atorvastatin (LIPITOR) 40 MG tablet Take 20 mg by mouth daily.       [name] b complex vitamins capsule Take 1 capsule  by mouth daily.       . fish oil-omega-3 fatty acids 1000 MG capsule Take 2 g by mouth daily.        . Multiple Vitamin (MULITIVITAMIN WITH MINERALS) TABS Take 1 tablet by mouth daily.         No current facility-administered medications for this visit.     No Known Allergies  BP 122/74  Pulse 64  Temp(Src) 98 F (36.7 C) (Temporal)  Resp 14  Ht 6\' 2"  (1.88 m)  Wt 246 lb 6.4 oz (111.766 kg)  BMI 31.62 kg/m2  No results found.   Review of Systems  Constitutional: Negative for fever, chills and diaphoresis.  HENT: Negative for sore throat, trouble swallowing and neck pain.   Eyes: Negative for photophobia and visual disturbance.  Respiratory: Negative for choking and shortness of breath.   Cardiovascular: Negative for chest pain and palpitations.  Gastrointestinal: Negative for nausea, vomiting, abdominal distention, anal bleeding and rectal pain.  Genitourinary:  Negative for dysuria, urgency, difficulty urinating and testicular pain.  Musculoskeletal: Negative for myalgias, arthralgias and gait problem.  Skin: Negative for color change and rash.  Neurological: Negative for dizziness, speech difficulty, weakness and numbness.  Hematological: Negative for adenopathy.  Psychiatric/Behavioral: Negative for hallucinations, confusion and agitation.       Objective:   Physical Exam  Constitutional: He is oriented to person, place, and time. He appears well-developed and well-nourished. No distress.  HENT:  Head: Normocephalic.  Mouth/Throat: Oropharynx is clear and moist. No oropharyngeal exudate.  Eyes: Conjunctivae and EOM are normal. Pupils are equal, round, and reactive to light. No scleral icterus.  Neck: Normal range of motion. No tracheal deviation present.  Cardiovascular: Normal rate, normal heart sounds and intact distal pulses.   Pulmonary/Chest: Effort normal. No respiratory distress.  Abdominal: Soft. He exhibits no distension. There is no tenderness. Hernia confirmed negative in the right inguinal area and confirmed negative in the left inguinal area.  Incisions clean with normal healing ridges.  No hernias  Genitourinary:     Musculoskeletal: Normal range of motion. He exhibits no tenderness.  Neurological: He is alert and oriented to person, place, and time. No cranial nerve deficit. He exhibits normal muscle tone. Coordination normal.  Skin: Skin is warm and dry. No rash noted. He is not diaphoretic.  Psychiatric: He has a normal mood and affect. His behavior is normal.       Assessment:     Recovering two weeks status post excision of chronic perirectal sinus.  No proof of fistula.  Pathology negative for Crohn's or malignancy.     Plan:     Continue wound care.  Ideally, one would be packed with Nu Gauze every day.  This is a challenge since he has no one to help him pack the wound at home.  We will have him come every few  days in the office for the next week to change the packing and make sure it is healing.  I suspect it would be more superficial in a week and will not require any more aggressive packing  Increase activity as tolerated to regular activity.  Low impact exercise such as walking an hour a day at least ideal.  Do not push through pain.  Diet as tolerated.  Low fat high fiber diet ideal.  Bowel regimen with 30 g fiber a day and fiber supplement as needed to avoid problems.  Return to clinic q2-3 weeks until closed.   Instructions discussed.  Followup with primary care physician for other health issues as would normally be done.  Questions answered.  The patient expressed understanding and appreciation

## 2012-12-30 NOTE — Patient Instructions (Addendum)
WOUND CARE  It is important that the wound be kept open.   -Keeping the skin edges apart will allow the wound to gradually heal from the base upwards.   - If the skin edges of the wound close too early, a new fluid pocket can form and infection can occur. -This is the reason to pack deeper wounds with gauze or ribbon -This is why drained wounds cannot be sewed closed right away  A healthy wound should form a lining of bright red "beefy" granulating tissue that will help shrink the wound and help the edges grow new skin into it.   -A little mucus / yellow discharge is normal (the body's natural way to try and form a scab) and should be gently washed off with soap and water with daily dressing changes.  -Green or foul smelling drainage implies bacterial colonization and can slow wound healing - a short course of antibiotic ointment (3-5 days) can help it clear up.  Call the doctor if it does not improve or worsens  -Avoid use of antibiotic ointments for more than a week as they can slow wound healing over time.    -Sometimes other wound care products will be used to reduce need for dressing changes and/or help clean up dirty wounds -Sometimes the surgeon needs to debride the wound in the office to remove dead or infected tissue out of the wound so it can heal more quickly and safely.    Change the dressing at least once a day -Wash the wound with mild soap and water gently every day.  It is good to shower or bathe the wound to help it clean out. -Use clean ribbon plain NU-gauze for smaller wounds (it does not need to be sterile, just clean) -Keep the raw wound moist with a little saline or KY (saline) gel on the gauze.  -A dry wound will take longer to heal.  -Keep the skin dry around the wound to prevent breakdown and irritation. -Pack the wound down to the base -The goal is to keep the skin apart, not overpack the wound -Use a Q-tip or blunt-tipped kabob stick toothpick to push the gauze down  to the base in narrow or deep wounds   -Cover with a clean gauze and tape -paper or Medipore tape tend to be gentle on the skin -rotate the orientation of the tape to avoid repeated stress/trauma on the skin -using an ACE or Coban wrap on wounds on arms or legs can be used instead.  Complete all antibiotics through the entire prescription to help the infection heal and prevent new places of infection   Returning the see the surgeon is helpful to follow the healing process and help the wound close as fast as possible.  ANORECTAL SURGERY: POST OP INSTRUCTIONS  1. Take your usually prescribed home medications unless otherwise directed. 2. DIET: Follow a light bland diet the first 24 hours after arrival home, such as soup, liquids, crackers, etc.  Be sure to include lots of fluids daily.  Avoid fast food or heavy meals as your are more likely to get nauseated.  Eat a low fat the next few days after surgery.   3. PAIN CONTROL: a. Pain is best controlled by a usual combination of three different methods TOGETHER: i. Ice/Heat ii. Over the counter pain medication iii. Prescription pain medication b. Most patients will experience some swelling and discomfort in the anus/rectal area. and incisions.  Ice packs or heat (30-60 minutes up to  6 times a day) will help. Use ice for the first few days to help decrease swelling and bruising, then switch to heat such as warm towels, sitz baths, warm baths, etc to help relax tight/sore spots and speed recovery.  Some people prefer to use ice alone, heat alone, alternating between ice & heat.  Experiment to what works for you.  Swelling and bruising can take several weeks to resolve.   c. It is helpful to take an over-the-counter pain medication regularly for the first few weeks.  Choose one of the following that works best for you: i. Naproxen (Aleve, etc)  Two 220mg  tabs twice a day ii. Ibuprofen (Advil, etc) Three 200mg  tabs four times a day (every meal &  bedtime) iii. Acetaminophen (Tylenol, etc) 500-650mg  four times a day (every meal & bedtime) d. A  prescription for pain medication (such as oxycodone, hydrocodone, etc) should be given to you upon discharge.  Take your pain medication as prescribed.  i. If you are having problems/concerns with the prescription medicine (does not control pain, nausea, vomiting, rash, itching, etc), please call us [phone] to see if we need to switch you to a different pain medicine that will work better for you and/or control your side effect better. ii. If you need a refill on your pain medication, please contact your pharmacy.  They will contact our office to request authorization. Prescriptions will not be filled after 5 pm or on week-ends. 4. KEEP YOUR BOWELS REGULAR a. The goal is one bowel movement a day b. Avoid getting constipated.  Between the surgery and the pain medications, it is common to experience some constipation.  Increasing fluid intake and taking a fiber supplement (such as Metamucil, Citrucel, FiberCon, MiraLax, etc) 1-2 times a day regularly will usually help prevent this problem from occurring.  A mild laxative (prune juice, Milk of Magnesia, MiraLax, etc) should be taken according to package directions if there are no bowel movements after 48 hours. c. Watch out for diarrhea.  If you have many loose bowel movements, simplify your diet to bland foods & liquids for a few days.  Stop any stool softeners and decrease your fiber supplement.  Switching to mild anti-diarrheal medications (Kayopectate, Pepto Bismol) can help.  If this worsens or does not improve, please call us.  5. Wound Care a. Remove your bandages the day after surgery.  Unless discharge instructions indicate otherwise, leave your bandage dry and in place overnight.  Remove the bandage during your first bowel movement.   b. Allow the wound packing to fall out over the next few days.  You can trim exposed gauze / ribbon as it  falls out.  You do not need to repack the wound unless instructed otherwise.  Wear an absorbent pad or soft cotton gauze in your underwear as needed to catch any drainage and help keep the area  c. Keep the area clean and dry.  Bathe / shower every day.  Keep the area clean by showering / bathing over the incision / wound.   It is okay to soak an open wound to help wash it.  Wet wipes or showers / gentle washing after bowel movements is often less traumatic than regular toilet paper. d. Bonita Quin may have some styrofoam-like soft packing in the rectum which will come out with the first bowel movement.  e. You will often notice bleeding with bowel movements.  This should slow down by the end of the first week of surgery  f. Expect some drainage.  This should slow down, too, by the end of the first week of surgery.  Wear an absorbent pad or soft cotton gauze in your underwear until the drainage stops. 6. ACTIVITIES as tolerated:   a. You may resume regular (light) daily activities beginning the next day-such as daily self-care, walking, climbing stairs-gradually increasing activities as tolerated.  If you can walk 30 minutes without difficulty, it is safe to try more intense activity such as jogging, treadmill, bicycling, low-impact aerobics, swimming, etc. b. Save the most intensive and strenuous activity for last such as sit-ups, heavy lifting, contact sports, etc  Refrain from any heavy lifting or straining until you are off narcotics for pain control.   c. DO NOT PUSH THROUGH PAIN.  Let pain be your guide: If it hurts to do something, don't do it.  Pain is your body warning you to avoid that activity for another week until the pain goes down. d. You may drive when you are no longer taking prescription pain medication, you can comfortably sit for long periods of time, and you can safely maneuver your car and apply brakes. e. Bonita Quin may have sexual intercourse when it is comfortable.  7. FOLLOW UP in our  office a. Please call [facility] at [phone] to set up an appointment to see your surgeon in the office for a follow-up appointment approximately 2 weeks after your surgery. b. Make sure that you call for this appointment the day you arrive home to insure a convenient appointment time. 10. IF YOU HAVE DISABILITY OR FAMILY LEAVE FORMS, BRING THEM TO THE OFFICE FOR PROCESSING.  DO NOT GIVE THEM TO YOUR DOCTOR.        WHEN TO CALL us [phone]: 1. Poor pain control 2. Reactions / problems with new medications (rash/itching, nausea, etc)  3. Fever over 101.5 F (38.5 C) 4. Inability to urinate 5. Nausea and/or vomiting 6. Worsening swelling or bruising 7. Continued bleeding from incision. 8. Increased pain, redness, or drainage from the incision  The clinic staff is available to answer your questions during regular business hours (8:30am-5pm).  Please don't hesitate to call and ask to speak to one of our nurses for clinical concerns.   A surgeon from [facility] is always on call at the hospitals   If you have a medical emergency, go to the nearest emergency room or call 911.    [facility] [address], [address], [place], [place]  [address] ? MAIN: [phone] ? TOLL FREE: [phone] ? FAX [phone] [email or web address]

## 2013-01-03 ENCOUNTER — Encounter (INDEPENDENT_AMBULATORY_CARE_PROVIDER_SITE_OTHER): Payer: Self-pay

## 2013-01-03 ENCOUNTER — Ambulatory Visit (INDEPENDENT_AMBULATORY_CARE_PROVIDER_SITE_OTHER): Payer: BC Managed Care – PPO | Admitting: General Surgery

## 2013-01-03 VITALS — BP 132/88 | HR 60 | Temp 98.8°F | Resp 16 | Ht 74.0 in | Wt 242.8 lb

## 2013-01-03 DIAGNOSIS — Z5189 Encounter for other specified aftercare: Secondary | ICD-10-CM

## 2013-01-03 NOTE — Progress Notes (Signed)
Patient came in today for re-packing of his perirectal wound.  The wound looks clean, no redness, no foul odor, and has good granulation tissue present.  There was some tracking present at the 6 o'clock position as well as a slight amount of yellowish discharge.  Patient reports no N/V, fever, or foul odor.  The patient tolerated the packing well and is continuing to wash the wound BID with warm, soapy water.  We repacked the wound with 1/4" iodoform gauze and covered it with a sterile 4x4 gauze.  The patient will return to see Dr. Michaell Cowing on 01/08/13 at 11:45.

## 2013-01-07 ENCOUNTER — Encounter (INDEPENDENT_AMBULATORY_CARE_PROVIDER_SITE_OTHER): Payer: BC Managed Care – PPO | Admitting: Surgery

## 2013-01-08 ENCOUNTER — Ambulatory Visit (INDEPENDENT_AMBULATORY_CARE_PROVIDER_SITE_OTHER): Payer: BC Managed Care – PPO | Admitting: Surgery

## 2013-01-08 ENCOUNTER — Encounter (INDEPENDENT_AMBULATORY_CARE_PROVIDER_SITE_OTHER): Payer: Self-pay | Admitting: Surgery

## 2013-01-08 VITALS — BP 126/74 | HR 68 | Temp 97.8°F | Resp 14 | Ht 74.0 in | Wt 244.2 lb

## 2013-01-08 DIAGNOSIS — K626 Ulcer of anus and rectum: Secondary | ICD-10-CM

## 2013-01-08 NOTE — Patient Instructions (Signed)
ANORECTAL SURGERY: POST OP INSTRUCTIONS  1. Take your usually prescribed home medications unless otherwise directed. 2. DIET: Follow a light bland diet the first 24 hours after arrival home, such as soup, liquids, crackers, etc.  Be sure to include lots of fluids daily.  Avoid fast food or heavy meals as your are more likely to get nauseated.  Eat a low fat the next few days after surgery.   3. PAIN CONTROL: a. Pain is best controlled by a usual combination of three different methods TOGETHER: i. Ice/Heat ii. Over the counter pain medication iii. Prescription pain medication b. Most patients will experience some swelling and discomfort in the anus/rectal area. and incisions.  Ice packs or heat (30-60 minutes up to 6 times a day) will help. Use ice for the first few days to help decrease swelling and bruising, then switch to heat such as warm towels, sitz baths, warm baths, etc to help relax tight/sore spots and speed recovery.  Some people prefer to use ice alone, heat alone, alternating between ice & heat.  Experiment to what works for you.  Swelling and bruising can take several weeks to resolve.   c. It is helpful to take an over-the-counter pain medication regularly for the first few weeks.  Choose one of the following that works best for you: i. Naproxen (Aleve, etc)  Two 220mg tabs twice a day ii. Ibuprofen (Advil, etc) Three 200mg tabs four times a day (every meal & bedtime) iii. Acetaminophen (Tylenol, etc) 500-650mg four times a day (every meal & bedtime) d. A  prescription for pain medication (such as oxycodone, hydrocodone, etc) should be given to you upon discharge.  Take your pain medication as prescribed.  i. If you are having problems/concerns with the prescription medicine (does not control pain, nausea, vomiting, rash, itching, etc), please call us [phone] to see if we need to switch you to a different pain medicine that will work better for you and/or control your side effect  better. ii. If you need a refill on your pain medication, please contact your pharmacy.  They will contact our office to request authorization. Prescriptions will not be filled after 5 pm or on week-ends.  Use a Sitz Bath 4-8 times a day for relief A sitz bath is a warm water bath taken in the sitting position that covers only the hips and buttocks. It may be used for either healing or hygiene purposes. Sitz baths are also used to relieve pain, itching, or muscle spasms. The water may contain medicine. Moist heat will help you heal and relax.  HOME CARE INSTRUCTIONS  Take 3 to 4 sitz baths a day. 1. Fill the bathtub half full with warm water. 2. Sit in the water and open the drain a little. 3. Turn on the warm water to keep the tub half full. Keep the water running constantly. 4. Soak in the water for 15 to 20 minutes. 5. After the sitz bath, pat the affected area dry first. SEEK MEDICAL CARE IF:  You get worse instead of better. Stop the sitz baths if you get worse.   4. KEEP YOUR BOWELS REGULAR a. The goal is one bowel movement a day b. Avoid getting constipated.  Between the surgery and the pain medications, it is common to experience some constipation.  Increasing fluid intake and taking a fiber supplement (such as Metamucil, Citrucel, FiberCon, MiraLax, etc) 1-2 times a day regularly will usually help prevent this problem from occurring.  A mild   laxative (prune juice, Milk of Magnesia, MiraLax, etc) should be taken according to package directions if there are no bowel movements after 48 hours. c. Watch out for diarrhea.  If you have many loose bowel movements, simplify your diet to bland foods & liquids for a few days.  Stop any stool softeners and decrease your fiber supplement.  Switching to mild anti-diarrheal medications (Kayopectate, Pepto Bismol) can help.  If this worsens or does not improve, please call us.  5. Wound Care a. Remove your bandages the day after surgery.  Unless  discharge instructions indicate otherwise, leave your bandage dry and in place overnight.  Remove the bandage during your first bowel movement.   b. Allow the wound packing to fall out over the next few days.  You can trim exposed gauze / ribbon as it falls out.  You do not need to repack the wound unless instructed otherwise.  Wear an absorbent pad or soft cotton gauze in your underwear as needed to catch any drainage and help keep the area  c. Keep the area clean and dry.  Bathe / shower every day.  Keep the area clean by showering / bathing over the incision / wound.   It is okay to soak an open wound to help wash it.  Wet wipes or showers / gentle washing after bowel movements is often less traumatic than regular toilet paper. d. You may have some styrofoam-like soft packing in the rectum which will come out with the first bowel movement.  e. You will often notice bleeding with bowel movements.  This should slow down by the end of the first week of surgery f. Expect some drainage.  This should slow down, too, by the end of the first week of surgery.  Wear an absorbent pad or soft cotton gauze in your underwear until the drainage stops. 6. ACTIVITIES as tolerated:   a. You may resume regular (light) daily activities beginning the next day-such as daily self-care, walking, climbing stairs-gradually increasing activities as tolerated.  If you can walk 30 minutes without difficulty, it is safe to try more intense activity such as jogging, treadmill, bicycling, low-impact aerobics, swimming, etc. b. Save the most intensive and strenuous activity for last such as sit-ups, heavy lifting, contact sports, etc  Refrain from any heavy lifting or straining until you are off narcotics for pain control.   c. DO NOT PUSH THROUGH PAIN.  Let pain be your guide: If it hurts to do something, don't do it.  Pain is your body warning you to avoid that activity for another week until the pain goes down. d. You may drive when  you are no longer taking prescription pain medication, you can comfortably sit for long periods of time, and you can safely maneuver your car and apply brakes. e. You may have sexual intercourse when it is comfortable.  7. FOLLOW UP in our office a. Please call [facility] at [phone] to set up an appointment to see your surgeon in the office for a follow-up appointment approximately 2 weeks after your surgery. b. Make sure that you call for this appointment the day you arrive home to insure a convenient appointment time. 10. IF YOU HAVE DISABILITY OR FAMILY LEAVE FORMS, BRING THEM TO THE OFFICE FOR PROCESSING.  DO NOT GIVE THEM TO YOUR DOCTOR.        WHEN TO CALL US [phone]: 1. Poor pain control 2. Reactions / problems with new medications (rash/itching, nausea, etc)    3. Fever over 101.5 F (38.5 C) 4. Inability to urinate 5. Nausea and/or vomiting 6. Worsening swelling or bruising 7. Continued bleeding from incision. 8. Increased pain, redness, or drainage from the incision  The clinic staff is available to answer your questions during regular business hours (8:30am-5pm).  Please don't hesitate to call and ask to speak to one of our nurses for clinical concerns.   A surgeon from [facility] is always on call at the hospitals   If you have a medical emergency, go to the nearest emergency room or call 911.    [facility], [place], [place], [place], [place]  [address] ? MAIN: [phone] ? TOLL FREE: [phone] ? FAX [phone] [email or web address]  GETTING TO GOOD BOWEL HEALTH. Irregular bowel habits such as constipation and diarrhea can lead to many problems over time.  Having one soft bowel movement a day is the most important way to prevent further problems.  The anorectal canal is designed to handle stretching and feces to safely manage our ability to get rid of solid waste (feces, poop, stool) out of our body.   BUT, hard constipated stools can act like ripping concrete bricks and diarrhea can be a burning fire to this very sensitive area of our body, causing inflamed hemorrhoids, anal fissures, increasing risk is perirectal abscesses, abdominal pain/bloating, an making irritable bowel worse.     The goal: ONE SOFT BOWEL MOVEMENT A DAY!  To have soft, regular bowel movements:    Drink at least 8 tall glasses of water a day.     Take plenty of fiber.  Fiber is the undigested part of plant food that passes into the colon, acting s "natures broom" to encourage bowel motility and movement.  Fiber can absorb and hold large amounts of water. This results in a larger, bulkier stool, which is soft and easier to pass. Work gradually over several weeks up to 6 servings a day of fiber (25g a day even more if needed) in the form of: o Vegetables -- Root (potatoes, carrots, turnips), leafy green (lettuce, salad greens, celery, spinach), or cooked high residue (cabbage, broccoli, etc) o Fruit -- Fresh (unpeeled skin & pulp), Dried (prunes, apricots, cherries, etc ),  or stewed ( applesauce)  o Whole grain breads, pasta, etc (whole wheat)  o Bran cereals    Bulking Agents -- This type of water-retaining fiber generally is easily obtained each day by one of the following:  o Psyllium bran -- The psyllium plant is remarkable because its ground seeds can retain so much water. This product is available as Metamucil, Konsyl, Effersyllium, Per Diem Fiber, or the less expensive generic preparation in drug and health food stores. Although labeled a laxative, it really is not a laxative.  o Methylcellulose -- This is another fiber derived from wood which also retains water. It is available as Citrucel. o Polyethylene Glycol - and "artificial" fiber commonly called Miralax or Glycolax.  It is helpful for people with gassy or bloated feelings with regular fiber o Flax Seed - a less gassy fiber than psyllium   No reading or other  relaxing activity while on the toilet. If bowel movements take longer than 5 minutes, you are too constipated   AVOID CONSTIPATION.  High fiber and water intake usually takes care of this.  Sometimes a laxative is needed to stimulate more frequent bowel movements, but    Laxatives are not a good long-term solution as it  can wear the colon out. o Osmotics (Milk of Magnesia, Fleets phosphosoda, Magnesium citrate, MiraLax, GoLytely) are safer than  o Stimulants (Senokot, Castor Oil, Dulcolax, Ex Lax)    o Do not take laxatives for more than 7days in a row.    IF SEVERELY CONSTIPATED, try a Bowel Retraining Program: o Do not use laxatives.  o Eat a diet high in roughage, such as bran cereals and leafy vegetables.  o Drink six (6) ounces of prune or apricot juice each morning.  o Eat two (2) large servings of stewed fruit each day.  o Take one (1) heaping tablespoon of a psyllium-based bulking agent twice a day. Use sugar-free sweetener when possible to avoid excessive calories.  o Eat a normal breakfast.  o Set aside 15 minutes after breakfast to sit on the toilet, but do not strain to have a bowel movement.  o If you do not have a bowel movement by the third day, use an enema and repeat the above steps.    Controlling diarrhea o Switch to liquids and simpler foods for a few days to avoid stressing your intestines further. o Avoid dairy products (especially milk & ice cream) for a short time.  The intestines often can lose the ability to digest lactose when stressed. o Avoid foods that cause gassiness or bloating.  Typical foods include beans and other legumes, cabbage, broccoli, and dairy foods.  Every person has some sensitivity to other foods, so listen to our body and avoid those foods that trigger problems for you. o Adding fiber (Citrucel, Metamucil, psyllium, Miralax) gradually can help thicken stools by absorbing excess fluid and retrain the intestines to act more normally.  Slowly increase  the dose over a few weeks.  Too much fiber too soon can backfire and cause cramping & bloating. o Probiotics (such as active yogurt, Align, etc) may help repopulate the intestines and colon with normal bacteria and calm down a sensitive digestive tract.  Most studies show it to be of mild help, though, and such products can be costly. o Medicines:   Bismuth subsalicylate (ex. Kayopectate, Pepto Bismol) every 30 minutes for up to 6 doses can help control diarrhea.  Avoid if pregnant.   Loperamide (Immodium) can slow down diarrhea.  Start with two tablets (4mg  total) first and then try one tablet every 6 hours.  Avoid if you are having fevers or severe pain.  If you are not better or start feeling worse, stop all medicines and call your doctor for advice o Call your doctor if you are getting worse or not better.  Sometimes further testing (cultures, endoscopy, X-ray studies, bloodwork, etc) may be needed to help diagnose and treat the cause of the diarrhea.  High-Fiber Diet Fiber is found in fruits, vegetables, and grains. A high-fiber diet encourages the addition of more whole grains, legumes, fruits, and vegetables in your diet. The recommended amount of fiber for adult males is 38 g per day. For adult females, it is 25 g per day. Pregnant and lactating women should get 28 g of fiber per day. If you have a digestive or bowel problem, ask your caregiver for advice before adding high-fiber foods to your diet. Eat a variety of high-fiber foods instead of only a select few type of foods.  PURPOSE  To increase stool bulk.  To make bowel movements more regular to prevent constipation.  To lower cholesterol.  To prevent overeating. WHEN IS THIS DIET USED?  It may be used  if you have constipation and hemorrhoids.  It may be used if you have uncomplicated diverticulosis (intestine condition) and irritable bowel syndrome.  It may be used if you need help with weight management.  It may be used if you  want to add it to your diet as a protective measure against atherosclerosis, diabetes, and cancer. SOURCES OF FIBER  Whole-grain breads and cereals.  Fruits, such as apples, oranges, bananas, berries, prunes, and pears.  Vegetables, such as green peas, carrots, sweet potatoes, beets, broccoli, cabbage, spinach, and artichokes.  Legumes, such split peas, soy, lentils.  Almonds. FIBER CONTENT IN FOODS Starches and Grains / Dietary Fiber (g)  Cheerios, 1 cup / 3 g  Corn Flakes cereal, 1 cup / 0.7 g  Rice crispy treat cereal, 1 cup / 0.3 g  Instant oatmeal (cooked),  cup / 2 g  Frosted wheat cereal, 1 cup / 5.1 g  Brown, long-grain rice (cooked), 1 cup / 3.5 g  White, long-grain rice (cooked), 1 cup / 0.6 g  Enriched macaroni (cooked), 1 cup / 2.5 g Legumes / Dietary Fiber (g)  Baked beans (canned, plain, or vegetarian),  cup / 5.2 g  Kidney beans (canned),  cup / 6.8 g  Pinto beans (cooked),  cup / 5.5 g Breads and Crackers / Dietary Fiber (g)  Plain or honey graham crackers, 2 squares / 0.7 g  Saltine crackers, 3 squares / 0.3 g  Plain, salted pretzels, 10 pieces / 1.8 g  Whole-wheat bread, 1 slice / 1.9 g  White bread, 1 slice / 0.7 g  Raisin bread, 1 slice / 1.2 g  Plain bagel, 3 oz / 2 g  Flour tortilla, 1 oz / 0.9 g  Corn tortilla, 1 small / 1.5 g  Hamburger or hotdog bun, 1 small / 0.9 g Fruits / Dietary Fiber (g)  Apple with skin, 1 medium / 4.4 g  Sweetened applesauce,  cup / 1.5 g  Banana,  medium / 1.5 g  Grapes, 10 grapes / 0.4 g  Orange, 1 small / 2.3 g  Raisin, 1.5 oz / 1.6 g  Melon, 1 cup / 1.4 g Vegetables / Dietary Fiber (g)  Green beans (canned),  cup / 1.3 g  Carrots (cooked),  cup / 2.3 g  Broccoli (cooked),  cup / 2.8 g  Peas (cooked),  cup / 4.4 g  Mashed potatoes,  cup / 1.6 g  Lettuce, 1 cup / 0.5 g  Corn (canned),  cup / 1.6 g  Tomato,  cup / 1.1 g Document Released: 03/13/2005 Document  Revised: 09/12/2011 Document Reviewed: 06/15/2011 [facility] Patient Information 2014 [place], [place].

## 2013-01-08 NOTE — Progress Notes (Signed)
Subjective:     Patient ID: Donald Grant, male   DOB: 02/28/1976, 37 y.o.   MRN: [id number]  HPI   Donald Grant  1976-01-02 [id number]  Patient Care Team: [name], MD as PCP - General (Family Medicine) [name], MD as Consulting Physician (Cardiology)  Procedure (Date: 12/19/2012):  Examination under anesthesia, excision of chronic anal sinus tract.   Pathology consistent with fistulous-like tract.  No Crohn's.  No cancer.  This patient returns for surgical re-evaluation.  He is feeling better.  He has been getting some intermittent nurse packing changes.  No more pain.  Using Metamucil fiber for a bowel regimen.  No fevers or chills.  Daily bowel movements.  Energy level rather good.  Not on any antibiotics.  Minimal bleeding.  Patient Active Problem List   Diagnosis Date Noted  . Essential hypertension 12/04/2012  . 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 PVC's (premature ventricular contractions) 01/25/2011    Past Medical History  Diagnosis Date  . Recurrent upper respiratory infection (URI)   . GERD (gastroesophageal reflux disease)   . Dysrhythmia     atrial flutter  . Dyslipidemia   . Atrial flutter     CTI ablation by Dr [name] 02/24/11  . Atrial tachycardia     mapped to AV node by Dr [name], ablation not performed  . Atrial fibrillation   . Premature ventricular contraction   . First degree AV block     Past Surgical History  Procedure Laterality Date  . Root canal    . Mouth surgery    . Atrial ablation surgery  02/24/11    CTI ablation by Dr [name]  . Cardioversion (bedside)  03/29/2011       . Cardioversion  03/29/2011    Procedure: CARDIOVERSION;  Surgeon: [name], MD;  Location: [facility] OR;  Service: Cardiovascular;  Laterality: N/A;  . Examination under anesthesia  12/19/12    Anal Fistula    History   Social History  . Marital Status: Married    Spouse Name: N/A    Number of Children: N/A  . Years of  Education: N/A   Occupational History  . Not on file.   Social History Main Topics  . Smoking status: Former Smoker    Quit date: 08/28/2010  . Smokeless tobacco: Not on file     Comment: have not smoked in 5 months  . Alcohol Use: Yes     Comment: drinks beer 3 to 4 times a week  . Drug Use: Yes    Special: Marijuana  . Sexual Activity: Yes   Other Topics Concern  . Not on file   Social History Narrative   His additional social history is notable that the patient exercises     several times a week, jogging up to 2 miles a day.  With exercise, he     notes that he has lost over 10 pounds.     Family History  Problem Relation Age of Onset  . Heart disease Mother     Current Outpatient Prescriptions  Medication Sig Dispense Refill  . acyclovir (ZOVIRAX) 400 MG tablet 400 mg as needed.       [name] aspirin 325 MG tablet Take 325 mg by mouth daily.      [name] atorvastatin (LIPITOR) 40 MG tablet Take 20 mg by mouth daily.       [name] b complex vitamins capsule Take 1 capsule by mouth daily.       [name]  fish oil-omega-3 fatty acids 1000 MG capsule Take 2 g by mouth daily.        . Multiple Vitamin (MULITIVITAMIN WITH MINERALS) TABS Take 1 tablet by mouth daily.         No current facility-administered medications for this visit.     No Known Allergies  BP 126/74  Pulse 68  Temp(Src) 97.8 F (36.6 C) (Temporal)  Resp 14  Ht 6\' 2"  (1.88 m)  Wt 244 lb 3.2 oz (110.768 kg)  BMI 31.34 kg/m2  No results found.   Review of Systems  Constitutional: Negative for fever, chills and diaphoresis.  HENT: Negative for sore throat and trouble swallowing.   Eyes: Negative for photophobia and visual disturbance.  Respiratory: Negative for choking and shortness of breath.   Cardiovascular: Negative for chest pain and palpitations.  Gastrointestinal: Negative for nausea, vomiting, abdominal distention, anal bleeding and rectal pain.  Genitourinary: Negative for dysuria, urgency, difficulty  urinating and testicular pain.  Musculoskeletal: Negative for arthralgias, gait problem, myalgias and neck pain.  Skin: Negative for color change and rash.  Neurological: Negative for dizziness, speech difficulty, weakness and numbness.  Hematological: Negative for adenopathy.  Psychiatric/Behavioral: Negative for hallucinations, confusion and agitation.       Objective:   Physical Exam  Constitutional: He is oriented to person, place, and time. He appears well-developed and well-nourished. No distress.  HENT:  Head: Normocephalic.  Mouth/Throat: Oropharynx is clear and moist. No oropharyngeal exudate.  Eyes: Conjunctivae and EOM are normal. Pupils are equal, round, and reactive to light. No scleral icterus.  Neck: Normal range of motion. No tracheal deviation present.  Cardiovascular: Normal rate, normal heart sounds and intact distal pulses.   Pulmonary/Chest: Effort normal. No respiratory distress.  Abdominal: Soft. He exhibits no distension. There is no tenderness. Hernia confirmed negative in the right inguinal area and confirmed negative in the left inguinal area.  Incisions clean with normal healing ridges.  No hernias  Genitourinary:     Musculoskeletal: Normal range of motion. He exhibits no tenderness.  Neurological: He is alert and oriented to person, place, and time. No cranial nerve deficit. He exhibits normal muscle tone. Coordination normal.  Skin: Skin is warm and dry. No rash noted. He is not diaphoretic.  Psychiatric: He has a normal mood and affect. His behavior is normal.       Assessment:     Recovering 3 weeks status post excision of chronic perirectal sinus.  No proof of fistula.  Pathology negative for Crohn's or malignancy.     Plan:     Continue wound care.  His hygiene is excellent.  I would just keep using a little cotton ball/gauze at least every day to catch any drainage.  I do not think any more deep/aggressive packing is needed at this  time.  Activity as tolerated.  Low impact exercise such as walking an hour a day at least ideal.  Do not push through pain.  Diet as tolerated.  Low fat high fiber diet ideal.  Bowel regimen with 30 g fiber a day and fiber supplement as needed to avoid problems.  Continue the Metamucil as that seems to be working well for him  Return to clinic q3-4 weeks until closed.   Instructions discussed.  Followup with primary care physician for other health issues as would normally be done.  Questions answered.  The patient expressed understanding and appreciation

## 2013-02-05 ENCOUNTER — Ambulatory Visit (INDEPENDENT_AMBULATORY_CARE_PROVIDER_SITE_OTHER): Payer: BC Managed Care – PPO | Admitting: Surgery

## 2013-02-05 ENCOUNTER — Encounter (INDEPENDENT_AMBULATORY_CARE_PROVIDER_SITE_OTHER): Payer: Self-pay | Admitting: Surgery

## 2013-02-05 VITALS — BP 110/70 | HR 72 | Resp 18 | Ht 74.0 in | Wt 241.0 lb

## 2013-02-05 DIAGNOSIS — K626 Ulcer of anus and rectum: Secondary | ICD-10-CM

## 2013-02-05 NOTE — Patient Instructions (Signed)
WOUND CARE  It is important that the wound be kept open.   -Keeping the skin edges apart will allow the wound to gradually heal from the base upwards.   - If the skin edges of the wound close too early, a new fluid pocket can form and infection can occur. -This is the reason to pack deeper wounds with gauze or ribbon -This is why drained wounds cannot be sewed closed right away  A healthy wound should form a lining of bright red "beefy" granulating tissue that will help shrink the wound and help the edges grow new skin into it.   -A little mucus / yellow discharge is normal (the body's natural way to try and form a scab) and should be gently washed off with soap and water with daily dressing changes.  -Green or foul smelling drainage implies bacterial colonization and can slow wound healing - a short course of antibiotic ointment (3-5 days) can help it clear up.  Call the doctor if it does not improve or worsens  -Avoid use of antibiotic ointments for more than a week as they can slow wound healing over time.    -Sometimes other wound care products will be used to reduce need for dressing changes and/or help clean up dirty wounds -Sometimes the surgeon needs to debride the wound in the office to remove dead or infected tissue out of the wound so it can heal more quickly and safely.    Change the dressing at least once a day -Wash the wound with mild soap and water gently every day.  It is good to shower or bathe the wound to help it clean out. -Use clean 4x4 gauze for medium/large wounds or ribbon plain NU-gauze for smaller wounds (it does not need to be sterile, just clean) -Keep the raw wound moist with a little saline or KY (saline) gel on the gauze.  -A dry wound will take longer to heal.  -Keep the skin dry around the wound to prevent breakdown and irritation. -Pack the wound down to the base -The goal is to keep the skin apart, not overpack the wound -Use a Q-tip or blunt-tipped kabob  stick toothpick to push the gauze down to the base in narrow or deep wounds   -Cover with a clean gauze and tape -paper or Medipore tape tend to be gentle on the skin -rotate the orientation of the tape to avoid repeated stress/trauma on the skin -using an ACE or Coban wrap on wounds on arms or legs can be used instead.  Complete all antibiotics through the entire prescription to help the infection heal and prevent new places of infection   Returning the see the surgeon is helpful to follow the healing process and help the wound close as fast as possible.  GETTING TO GOOD BOWEL HEALTH. Irregular bowel habits such as constipation and diarrhea can lead to many problems over time.  Having one soft bowel movement a day is the most important way to prevent further problems.  The anorectal canal is designed to handle stretching and feces to safely manage our ability to get rid of solid waste (feces, poop, stool) out of our body.  BUT, hard constipated stools can act like ripping concrete bricks and diarrhea can be a burning fire to this very sensitive area of our body, causing inflamed hemorrhoids, anal fissures, increasing risk is perirectal abscesses, abdominal pain/bloating, an making irritable bowel worse.     The goal: ONE SOFT BOWEL MOVEMENT A   DAY!  To have soft, regular bowel movements:    Drink at least 8 tall glasses of water a day.     Take plenty of fiber.  Fiber is the undigested part of plant food that passes into the colon, acting s "natures broom" to encourage bowel motility and movement.  Fiber can absorb and hold large amounts of water. This results in a larger, bulkier stool, which is soft and easier to pass. Work gradually over several weeks up to 6 servings a day of fiber (25g a day even more if needed) in the form of: o Vegetables -- Root (potatoes, carrots, turnips), leafy green (lettuce, salad greens, celery, spinach), or cooked high residue (cabbage, broccoli, etc) o Fruit -- Fresh  (unpeeled skin & pulp), Dried (prunes, apricots, cherries, etc ),  or stewed ( applesauce)  o Whole grain breads, pasta, etc (whole wheat)  o Bran cereals    Bulking Agents -- This type of water-retaining fiber generally is easily obtained each day by one of the following:  o Psyllium bran -- The psyllium plant is remarkable because its ground seeds can retain so much water. This product is available as Metamucil, Konsyl, Effersyllium, Per Diem Fiber, or the less expensive generic preparation in drug and health food stores. Although labeled a laxative, it really is not a laxative.  o Methylcellulose -- This is another fiber derived from wood which also retains water. It is available as Citrucel. o Polyethylene Glycol - and "artificial" fiber commonly called Miralax or Glycolax.  It is helpful for people with gassy or bloated feelings with regular fiber o Flax Seed - a less gassy fiber than psyllium   No reading or other relaxing activity while on the toilet. If bowel movements take longer than 5 minutes, you are too constipated   AVOID CONSTIPATION.  High fiber and water intake usually takes care of this.  Sometimes a laxative is needed to stimulate more frequent bowel movements, but    Laxatives are not a good long-term solution as it can wear the colon out. o Osmotics (Milk of Magnesia, Fleets phosphosoda, Magnesium citrate, MiraLax, GoLytely) are safer than  o Stimulants (Senokot, Castor Oil, Dulcolax, Ex Lax)    o Do not take laxatives for more than 7days in a row.    IF SEVERELY CONSTIPATED, try a Bowel Retraining Program: o Do not use laxatives.  o Eat a diet high in roughage, such as bran cereals and leafy vegetables.  o Drink six (6) ounces of prune or apricot juice each morning.  o Eat two (2) large servings of stewed fruit each day.  o Take one (1) heaping tablespoon of a psyllium-based bulking agent twice a day. Use sugar-free sweetener when possible to avoid excessive calories.  o Eat  a normal breakfast.  o Set aside 15 minutes after breakfast to sit on the toilet, but do not strain to have a bowel movement.  o If you do not have a bowel movement by the third day, use an enema and repeat the above steps.    Controlling diarrhea o Switch to liquids and simpler foods for a few days to avoid stressing your intestines further. o Avoid dairy products (especially milk & ice cream) for a short time.  The intestines often can lose the ability to digest lactose when stressed. o Avoid foods that cause gassiness or bloating.  Typical foods include beans and other legumes, cabbage, broccoli, and dairy foods.  Every person has some sensitivity to other   foods, so listen to our body and avoid those foods that trigger problems for you. o Adding fiber (Citrucel, Metamucil, psyllium, Miralax) gradually can help thicken stools by absorbing excess fluid and retrain the intestines to act more normally.  Slowly increase the dose over a few weeks.  Too much fiber too soon can backfire and cause cramping & bloating. o Probiotics (such as active yogurt, Align, etc) may help repopulate the intestines and colon with normal bacteria and calm down a sensitive digestive tract.  Most studies show it to be of mild help, though, and such products can be costly. o Medicines:   Bismuth subsalicylate (ex. Kayopectate, Pepto Bismol) every 30 minutes for up to 6 doses can help control diarrhea.  Avoid if pregnant.   Loperamide (Immodium) can slow down diarrhea.  Start with two tablets (4mg  total) first and then try one tablet every 6 hours.  Avoid if you are having fevers or severe pain.  If you are not better or start feeling worse, stop all medicines and call your doctor for advice o Call your doctor if you are getting worse or not better.  Sometimes further testing (cultures, endoscopy, X-ray studies, bloodwork, etc) may be needed to help diagnose and treat the cause of the diarrhea.

## 2013-02-05 NOTE — Progress Notes (Signed)
Subjective:     Patient ID: Donald Grant, male   DOB: 06-15-1975, 37 y.o.   MRN: [id number]  HPI   Donald Grant  1975/11/20 [id number]  Patient Care Team: [name], MD as PCP - General (Family Medicine) [name], MD as Consulting Physician (Cardiology)  Procedure (Date: 12/19/2012):  Examination under anesthesia, excision of chronic anal sinus tract.   Pathology consistent with fistulous-like tract.  No Crohn's.  No cancer.  This patient returns for surgical re-evaluation.  He is feeling better.  He had a couple episodes of fevers that went away.  No longer needing to pack the area.  Drainage has gone down and is minimal.  No more pain.  Using Metamucil fiber for a bowel regimen.  No sweats/chills.  Daily bowel movements.  Energy level rather good.  Not on any antibiotics. Minimal bleeding.  Patient Active Problem List   Diagnosis Date Noted  . Essential hypertension 12/04/2012  . 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 PVC's (premature ventricular contractions) 01/25/2011    Past Medical History  Diagnosis Date  . Recurrent upper respiratory infection (URI)   . GERD (gastroesophageal reflux disease)   . Dysrhythmia     atrial flutter  . Dyslipidemia   . Atrial flutter     CTI ablation by Dr [name] 02/24/11  . Atrial tachycardia     mapped to AV node by Dr [name], ablation not performed  . Atrial fibrillation   . Premature ventricular contraction   . First degree AV block     Past Surgical History  Procedure Laterality Date  . Root canal    . Mouth surgery    . Atrial ablation surgery  02/24/11    CTI ablation by Dr [name]  . Cardioversion (bedside)  03/29/2011       . Cardioversion  03/29/2011    Procedure: CARDIOVERSION;  Surgeon: [name], MD;  Location: [facility] OR;  Service: Cardiovascular;  Laterality: N/A;  . Examination under anesthesia  12/19/12    Anal Fistula    History   Social History  . Marital Status:  Married    Spouse Name: N/A    Number of Children: N/A  . Years of Education: N/A   Occupational History  . Not on file.   Social History Main Topics  . Smoking status: Former Smoker    Quit date: 08/28/2010  . Smokeless tobacco: Not on file     Comment: have not smoked in 5 months  . Alcohol Use: Yes     Comment: drinks beer 3 to 4 times a week  . Drug Use: Yes    Special: Marijuana  . Sexual Activity: Yes   Other Topics Concern  . Not on file   Social History Narrative   His additional social history is notable that the patient exercises     several times a week, jogging up to 2 miles a day.  With exercise, he     notes that he has lost over 10 pounds.     Family History  Problem Relation Age of Onset  . Heart disease Mother     Current Outpatient Prescriptions  Medication Sig Dispense Refill  . acyclovir (ZOVIRAX) 400 MG tablet 400 mg as needed.       [name] aspirin 325 MG tablet Take 325 mg by mouth daily.      [name] atorvastatin (LIPITOR) 40 MG tablet Take 20 mg by mouth daily.       [name]  b complex vitamins capsule Take 1 capsule by mouth daily.       . fish oil-omega-3 fatty acids 1000 MG capsule Take 2 g by mouth daily.        . Multiple Vitamin (MULITIVITAMIN WITH MINERALS) TABS Take 1 tablet by mouth daily.         No current facility-administered medications for this visit.     No Known Allergies  BP 110/70  Pulse 72  Resp 18  Ht 6\' 2"  (1.88 m)  Wt 241 lb (109.317 kg)  BMI 30.93 kg/m2  No results found.   Review of Systems  Constitutional: Negative for fever, chills and diaphoresis.  HENT: Negative for sore throat and trouble swallowing.   Eyes: Negative for photophobia and visual disturbance.  Respiratory: Negative for choking and shortness of breath.   Cardiovascular: Negative for chest pain and palpitations.  Gastrointestinal: Negative for nausea, vomiting, abdominal distention, anal bleeding and rectal pain.  Genitourinary: Negative for dysuria,  urgency, difficulty urinating and testicular pain.  Musculoskeletal: Negative for arthralgias, gait problem, myalgias and neck pain.  Skin: Negative for color change and rash.  Neurological: Negative for dizziness, speech difficulty, weakness and numbness.  Hematological: Negative for adenopathy.  Psychiatric/Behavioral: Negative for hallucinations, confusion and agitation.       Objective:   Physical Exam  Constitutional: He is oriented to person, place, and time. He appears well-developed and well-nourished. No distress.  HENT:  Head: Normocephalic.  Mouth/Throat: Oropharynx is clear and moist. No oropharyngeal exudate.  Eyes: Conjunctivae and EOM are normal. Pupils are equal, round, and reactive to light. No scleral icterus.  Neck: Normal range of motion. No tracheal deviation present.  Cardiovascular: Normal rate, normal heart sounds and intact distal pulses.   Pulmonary/Chest: Effort normal. No respiratory distress.  Abdominal: Soft. He exhibits no distension. There is no tenderness. Hernia confirmed negative in the right inguinal area and confirmed negative in the left inguinal area.  Incisions clean with normal healing ridges.  No hernias  Genitourinary:     Musculoskeletal: Normal range of motion. He exhibits no tenderness.  Neurological: He is alert and oriented to person, place, and time. No cranial nerve deficit. He exhibits normal muscle tone. Coordination normal.  Skin: Skin is warm and dry. No rash noted. He is not diaphoretic.  Psychiatric: He has a normal mood and affect. His behavior is normal.       Assessment:     Recovering 6 weeks status post excision of chronic perirectal sinus.  No proof of fistula.  Pathology negative for Crohn's or malignancy.     Plan:     Continue wound care.  His hygiene is excellent.  I would just keep using a little cotton ball/gauze at least every day to catch any drainage.  I do not think any more deep/aggressive packing is needed  at this time.  Activity as tolerated.  Low impact exercise such as walking an hour a day at least ideal.  Do not push through pain.  Diet as tolerated.  Low fat high fiber diet ideal.  Bowel regimen with 30 g fiber a day and fiber supplement as needed to avoid problems.  Continue the Metamucil as that seems to be working well for him  Return to clinic q3-4 weeks until closed.   Instructions discussed.  Followup with primary care physician for other health issues as would normally be done.  Questions answered.  The patient expressed understanding and appreciation

## 2013-03-10 ENCOUNTER — Ambulatory Visit (INDEPENDENT_AMBULATORY_CARE_PROVIDER_SITE_OTHER): Payer: BC Managed Care – PPO | Admitting: Surgery

## 2013-03-10 ENCOUNTER — Encounter (INDEPENDENT_AMBULATORY_CARE_PROVIDER_SITE_OTHER): Payer: Self-pay | Admitting: Surgery

## 2013-03-10 ENCOUNTER — Encounter (INDEPENDENT_AMBULATORY_CARE_PROVIDER_SITE_OTHER): Payer: Self-pay

## 2013-03-10 VITALS — BP 108/78 | HR 48 | Temp 97.8°F | Resp 16 | Ht 74.0 in | Wt 237.8 lb

## 2013-03-10 DIAGNOSIS — K626 Ulcer of anus and rectum: Secondary | ICD-10-CM

## 2013-03-10 NOTE — Progress Notes (Signed)
Subjective:     Patient ID: Donald Grant, male   DOB: 06-25-1975, 37 y.o.   MRN: [id number]  HPI   Donald Grant  11/17/1975 [id number]  Patient Care Team: [name], MD as PCP - General (Family Medicine) [name], MD as Consulting Physician (Cardiology)  Procedure (Date: 12/19/2012):  Examination under anesthesia, excision of chronic anal sinus tract.   Pathology consistent with fistulous-like tract.  No Crohn's.  No cancer.  This patient returns for surgical re-evaluation.  He is feeling well.  No longer needing to pack the area.  He noticed very minimal yellow mucus when he wipes.  No incontinence to flatus or stool.  No pain.  Using Metamucil fiber for a bowel regimen.  No sweats/chills.  Daily bowel movements.  Energy level rather good.  Not on any antibiotics. Minimal bleeding.  Patient Active Problem List   Diagnosis Date Noted  . Essential hypertension 12/04/2012  . 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 PVC's (premature ventricular contractions) 01/25/2011    Past Medical History  Diagnosis Date  . Recurrent upper respiratory infection (URI)   . GERD (gastroesophageal reflux disease)   . Dysrhythmia     atrial flutter  . Dyslipidemia   . Atrial flutter     CTI ablation by Dr [name] 02/24/11  . Atrial tachycardia     mapped to AV node by Dr [name], ablation not performed  . Atrial fibrillation   . Premature ventricular contraction   . First degree AV block   . Gout     bilateral feet    Past Surgical History  Procedure Laterality Date  . Root canal    . Mouth surgery    . Atrial ablation surgery  02/24/11    CTI ablation by Dr [name]  . Cardioversion (bedside)  03/29/2011       . Cardioversion  03/29/2011    Procedure: CARDIOVERSION;  Surgeon: [name], MD;  Location: [facility] OR;  Service: Cardiovascular;  Laterality: N/A;  . Examination under anesthesia  12/19/12    Anal Fistula    History   Social History  .  Marital Status: Married    Spouse Name: N/A    Number of Children: N/A  . Years of Education: N/A   Occupational History  . Not on file.   Social History Main Topics  . Smoking status: Former Smoker    Quit date: 08/28/2010  . Smokeless tobacco: Not on file  . Alcohol Use: Yes     Comment: drinks beer 3 to 4 times a week  . Drug Use: Yes    Special: Marijuana     Comment: still currently smoking  . Sexual Activity: Yes   Other Topics Concern  . Not on file   Social History Narrative   His additional social history is notable that the patient exercises     several times a week, jogging up to 2 miles a day.  With exercise, he     notes that he has lost over 10 pounds.     Family History  Problem Relation Age of Onset  . Heart disease Mother   . Achondroplasia Mother   . Alcohol abuse Mother   . Achondroplasia Son     Current Outpatient Prescriptions  Medication Sig Dispense Refill  . acyclovir (ZOVIRAX) 400 MG tablet 400 mg as needed.       [name] allopurinol (ZYLOPRIM) 300 MG tablet Take 300 mg by mouth daily.      [name]  aspirin 325 MG tablet Take 325 mg by mouth daily.      [name] atorvastatin (LIPITOR) 40 MG tablet Take 20 mg by mouth daily.       [name] b complex vitamins capsule Take 1 capsule by mouth daily.       . fish oil-omega-3 fatty acids 1000 MG capsule Take 2 g by mouth daily.        . Multiple Vitamin (MULITIVITAMIN WITH MINERALS) TABS Take 1 tablet by mouth daily.         No current facility-administered medications for this visit.     No Known Allergies  BP 108/78  Pulse 48  Temp(Src) 97.8 F (36.6 C) (Temporal)  Resp 16  Ht 6\' 2"  (1.88 m)  Wt 237 lb 12.8 oz (107.865 kg)  BMI 30.52 kg/m2  No results found.   Review of Systems  Constitutional: Negative for fever, chills and diaphoresis.  HENT: Negative for sore throat and trouble swallowing.   Eyes: Negative for photophobia and visual disturbance.  Respiratory: Negative for choking and shortness of  breath.   Cardiovascular: Negative for chest pain and palpitations.  Gastrointestinal: Negative for nausea, vomiting, abdominal distention, anal bleeding and rectal pain.  Genitourinary: Negative for dysuria, urgency, difficulty urinating and testicular pain.  Musculoskeletal: Negative for arthralgias, gait problem, myalgias and neck pain.  Skin: Positive for wound. Negative for color change and rash.  Neurological: Negative for dizziness, speech difficulty, weakness and numbness.  Hematological: Negative for adenopathy.  Psychiatric/Behavioral: Negative for hallucinations, confusion and agitation.       Objective:   Physical Exam  Constitutional: He is oriented to person, place, and time. He appears well-developed and well-nourished. No distress.  HENT:  Head: Normocephalic.  Mouth/Throat: Oropharynx is clear and moist. No oropharyngeal exudate.  Eyes: Conjunctivae and EOM are normal. Pupils are equal, round, and reactive to light. No scleral icterus.  Neck: Normal range of motion. No tracheal deviation present.  Cardiovascular: Normal rate, normal heart sounds and intact distal pulses.   Pulmonary/Chest: Effort normal. No respiratory distress.  Abdominal: Soft. He exhibits no distension. There is no tenderness. Hernia confirmed negative in the right inguinal area and confirmed negative in the left inguinal area.  Incisions clean with normal healing ridges.  No hernias  Genitourinary:     Musculoskeletal: Normal range of motion. He exhibits no tenderness.  Neurological: He is alert and oriented to person, place, and time. No cranial nerve deficit. He exhibits normal muscle tone. Coordination normal.  Skin: Skin is warm and dry. No rash noted. He is not diaphoretic.  Psychiatric: He has a normal mood and affect. His behavior is normal.       Assessment:     Recovering 2.5 months status post excision of chronic perirectal sinus.  No proof of fistula.  Pathology negative for  Crohn's or malignancy.     Plan:     Continue wound care.  His hygiene is excellent.  I would just keep using a little cotton ball/gauze at least every day to catch any drainage.  I do not think any more deep/aggressive packing is needed at this time.  Activity as tolerated.  Low impact exercise such as walking an hour a day at least ideal.  Do not push through pain.  Diet as tolerated.  Low fat high fiber diet ideal.  Bowel regimen with 30 g fiber a day and fiber supplement as needed to avoid problems.  Continue the Metamucil as that seems to be working  well for him  Return to clinic q3-4 weeks until closed - probably next time.   Instructions discussed.  Followup with primary care physician for other health issues as would normally be done.  Questions answered.  The patient expressed understanding and appreciation

## 2013-03-10 NOTE — Patient Instructions (Signed)
ANORECTAL SURGERY: POST OP INSTRUCTIONS  1. Take your usually prescribed home medications unless otherwise directed. 2. DIET: Follow a light bland diet the first 24 hours after arrival home, such as soup, liquids, crackers, etc.  Be sure to include lots of fluids daily.  Avoid fast food or heavy meals as your are more likely to get nauseated.  Eat a low fat the next few days after surgery.   3. PAIN CONTROL: a. Pain is best controlled by a usual combination of three different methods TOGETHER: i. Ice/Heat ii. Over the counter pain medication iii. Prescription pain medication b. Most patients will experience some swelling and discomfort in the anus/rectal area. and incisions.  Ice packs or heat (30-60 minutes up to 6 times a day) will help. Use ice for the first few days to help decrease swelling and bruising, then switch to heat such as warm towels, sitz baths, warm baths, etc to help relax tight/sore spots and speed recovery.  Some people prefer to use ice alone, heat alone, alternating between ice & heat.  Experiment to what works for you.  Swelling and bruising can take several weeks to resolve.   c. It is helpful to take an over-the-counter pain medication regularly for the first few weeks.  Choose one of the following that works best for you: i. Naproxen (Aleve, etc)  Two 220mg tabs twice a day ii. Ibuprofen (Advil, etc) Three 200mg tabs four times a day (every meal & bedtime) iii. Acetaminophen (Tylenol, etc) 500-650mg four times a day (every meal & bedtime) d. A  prescription for pain medication (such as oxycodone, hydrocodone, etc) should be given to you upon discharge.  Take your pain medication as prescribed.  i. If you are having problems/concerns with the prescription medicine (does not control pain, nausea, vomiting, rash, itching, etc), please call us [phone] to see if we need to switch you to a different pain medicine that will work better for you and/or control your side effect  better. ii. If you need a refill on your pain medication, please contact your pharmacy.  They will contact our office to request authorization. Prescriptions will not be filled after 5 pm or on week-ends.  Use a Sitz Bath 4-8 times a day for relief A sitz bath is a warm water bath taken in the sitting position that covers only the hips and buttocks. It may be used for either healing or hygiene purposes. Sitz baths are also used to relieve pain, itching, or muscle spasms. The water may contain medicine. Moist heat will help you heal and relax.  HOME CARE INSTRUCTIONS  Take 3 to 4 sitz baths a day. 1. Fill the bathtub half full with warm water. 2. Sit in the water and open the drain a little. 3. Turn on the warm water to keep the tub half full. Keep the water running constantly. 4. Soak in the water for 15 to 20 minutes. 5. After the sitz bath, pat the affected area dry first. SEEK MEDICAL CARE IF:  You get worse instead of better. Stop the sitz baths if you get worse.   4. KEEP YOUR BOWELS REGULAR a. The goal is one bowel movement a day b. Avoid getting constipated.  Between the surgery and the pain medications, it is common to experience some constipation.  Increasing fluid intake and taking a fiber supplement (such as Metamucil, Citrucel, FiberCon, MiraLax, etc) 1-2 times a day regularly will usually help prevent this problem from occurring.  A mild   laxative (prune juice, Milk of Magnesia, MiraLax, etc) should be taken according to package directions if there are no bowel movements after 48 hours. c. Watch out for diarrhea.  If you have many loose bowel movements, simplify your diet to bland foods & liquids for a few days.  Stop any stool softeners and decrease your fiber supplement.  Switching to mild anti-diarrheal medications (Kayopectate, Pepto Bismol) can help.  If this worsens or does not improve, please call us.  5. Wound Care a. Remove your bandages the day after surgery.  Unless  discharge instructions indicate otherwise, leave your bandage dry and in place overnight.  Remove the bandage during your first bowel movement.   b. Allow the wound packing to fall out over the next few days.  You can trim exposed gauze / ribbon as it falls out.  You do not need to repack the wound unless instructed otherwise.  Wear an absorbent pad or soft cotton gauze in your underwear as needed to catch any drainage and help keep the area  c. Keep the area clean and dry.  Bathe / shower every day.  Keep the area clean by showering / bathing over the incision / wound.   It is okay to soak an open wound to help wash it.  Wet wipes or showers / gentle washing after bowel movements is often less traumatic than regular toilet paper. d. You may have some styrofoam-like soft packing in the rectum which will come out with the first bowel movement.  e. You will often notice bleeding with bowel movements.  This should slow down by the end of the first week of surgery f. Expect some drainage.  This should slow down, too, by the end of the first week of surgery.  Wear an absorbent pad or soft cotton gauze in your underwear until the drainage stops. 6. ACTIVITIES as tolerated:   a. You may resume regular (light) daily activities beginning the next day-such as daily self-care, walking, climbing stairs-gradually increasing activities as tolerated.  If you can walk 30 minutes without difficulty, it is safe to try more intense activity such as jogging, treadmill, bicycling, low-impact aerobics, swimming, etc. b. Save the most intensive and strenuous activity for last such as sit-ups, heavy lifting, contact sports, etc  Refrain from any heavy lifting or straining until you are off narcotics for pain control.   c. DO NOT PUSH THROUGH PAIN.  Let pain be your guide: If it hurts to do something, don't do it.  Pain is your body warning you to avoid that activity for another week until the pain goes down. d. You may drive when  you are no longer taking prescription pain medication, you can comfortably sit for long periods of time, and you can safely maneuver your car and apply brakes. e. You may have sexual intercourse when it is comfortable.  7. FOLLOW UP in our office a. Please call [facility] at [phone] to set up an appointment to see your surgeon in the office for a follow-up appointment approximately 2 weeks after your surgery. b. Make sure that you call for this appointment the day you arrive home to insure a convenient appointment time. 10. IF YOU HAVE DISABILITY OR FAMILY LEAVE FORMS, BRING THEM TO THE OFFICE FOR PROCESSING.  DO NOT GIVE THEM TO YOUR DOCTOR.        WHEN TO CALL US [phone]: 1. Poor pain control 2. Reactions / problems with new medications (rash/itching, nausea, etc)    3. Fever over 101.5 F (38.5 C) 4. Inability to urinate 5. Nausea and/or vomiting 6. Worsening swelling or bruising 7. Continued bleeding from incision. 8. Increased pain, redness, or drainage from the incision  The clinic staff is available to answer your questions during regular business hours (8:30am-5pm).  Please don't hesitate to call and ask to speak to one of our nurses for clinical concerns.   A surgeon from [facility] is always on call at the hospitals   If you have a medical emergency, go to the nearest emergency room or call 911.    [facility], [place], [place], [place], [place]  [address] ? MAIN: [phone] ? TOLL FREE: [phone] ? FAX [phone] [email or web address]  GETTING TO GOOD BOWEL HEALTH. Irregular bowel habits such as constipation and diarrhea can lead to many problems over time.  Having one soft bowel movement a day is the most important way to prevent further problems.  The anorectal canal is designed to handle stretching and feces to safely manage our ability to get rid of solid waste (feces, poop, stool) out of our body.   BUT, hard constipated stools can act like ripping concrete bricks and diarrhea can be a burning fire to this very sensitive area of our body, causing inflamed hemorrhoids, anal fissures, increasing risk is perirectal abscesses, abdominal pain/bloating, an making irritable bowel worse.     The goal: ONE SOFT BOWEL MOVEMENT A DAY!  To have soft, regular bowel movements:    Drink at least 8 tall glasses of water a day.     Take plenty of fiber.  Fiber is the undigested part of plant food that passes into the colon, acting s "natures broom" to encourage bowel motility and movement.  Fiber can absorb and hold large amounts of water. This results in a larger, bulkier stool, which is soft and easier to pass. Work gradually over several weeks up to 6 servings a day of fiber (25g a day even more if needed) in the form of: o Vegetables -- Root (potatoes, carrots, turnips), leafy green (lettuce, salad greens, celery, spinach), or cooked high residue (cabbage, broccoli, etc) o Fruit -- Fresh (unpeeled skin & pulp), Dried (prunes, apricots, cherries, etc ),  or stewed ( applesauce)  o Whole grain breads, pasta, etc (whole wheat)  o Bran cereals    Bulking Agents -- This type of water-retaining fiber generally is easily obtained each day by one of the following:  o Psyllium bran -- The psyllium plant is remarkable because its ground seeds can retain so much water. This product is available as Metamucil, Konsyl, Effersyllium, Per Diem Fiber, or the less expensive generic preparation in drug and health food stores. Although labeled a laxative, it really is not a laxative.  o Methylcellulose -- This is another fiber derived from wood which also retains water. It is available as Citrucel. o Polyethylene Glycol - and "artificial" fiber commonly called Miralax or Glycolax.  It is helpful for people with gassy or bloated feelings with regular fiber o Flax Seed - a less gassy fiber than psyllium   No reading or other  relaxing activity while on the toilet. If bowel movements take longer than 5 minutes, you are too constipated   AVOID CONSTIPATION.  High fiber and water intake usually takes care of this.  Sometimes a laxative is needed to stimulate more frequent bowel movements, but    Laxatives are not a good long-term solution as it  can wear the colon out. o Osmotics (Milk of Magnesia, Fleets phosphosoda, Magnesium citrate, MiraLax, GoLytely) are safer than  o Stimulants (Senokot, Castor Oil, Dulcolax, Ex Lax)    o Do not take laxatives for more than 7days in a row.    IF SEVERELY CONSTIPATED, try a Bowel Retraining Program: o Do not use laxatives.  o Eat a diet high in roughage, such as bran cereals and leafy vegetables.  o Drink six (6) ounces of prune or apricot juice each morning.  o Eat two (2) large servings of stewed fruit each day.  o Take one (1) heaping tablespoon of a psyllium-based bulking agent twice a day. Use sugar-free sweetener when possible to avoid excessive calories.  o Eat a normal breakfast.  o Set aside 15 minutes after breakfast to sit on the toilet, but do not strain to have a bowel movement.  o If you do not have a bowel movement by the third day, use an enema and repeat the above steps.    Controlling diarrhea o Switch to liquids and simpler foods for a few days to avoid stressing your intestines further. o Avoid dairy products (especially milk & ice cream) for a short time.  The intestines often can lose the ability to digest lactose when stressed. o Avoid foods that cause gassiness or bloating.  Typical foods include beans and other legumes, cabbage, broccoli, and dairy foods.  Every person has some sensitivity to other foods, so listen to our body and avoid those foods that trigger problems for you. o Adding fiber (Citrucel, Metamucil, psyllium, Miralax) gradually can help thicken stools by absorbing excess fluid and retrain the intestines to act more normally.  Slowly increase  the dose over a few weeks.  Too much fiber too soon can backfire and cause cramping & bloating. o Probiotics (such as active yogurt, Align, etc) may help repopulate the intestines and colon with normal bacteria and calm down a sensitive digestive tract.  Most studies show it to be of mild help, though, and such products can be costly. o Medicines:   Bismuth subsalicylate (ex. Kayopectate, Pepto Bismol) every 30 minutes for up to 6 doses can help control diarrhea.  Avoid if pregnant.   Loperamide (Immodium) can slow down diarrhea.  Start with two tablets (4mg total) first and then try one tablet every 6 hours.  Avoid if you are having fevers or severe pain.  If you are not better or start feeling worse, stop all medicines and call your doctor for advice o Call your doctor if you are getting worse or not better.  Sometimes further testing (cultures, endoscopy, X-ray studies, bloodwork, etc) may be needed to help diagnose and treat the cause of the diarrhea.   

## 2013-04-11 ENCOUNTER — Encounter (INDEPENDENT_AMBULATORY_CARE_PROVIDER_SITE_OTHER): Payer: BC Managed Care – PPO | Admitting: Surgery

## 2013-04-16 ENCOUNTER — Encounter (INDEPENDENT_AMBULATORY_CARE_PROVIDER_SITE_OTHER): Payer: Self-pay | Admitting: Surgery

## 2013-04-16 ENCOUNTER — Ambulatory Visit (INDEPENDENT_AMBULATORY_CARE_PROVIDER_SITE_OTHER): Payer: BC Managed Care – PPO | Admitting: Surgery

## 2013-04-16 ENCOUNTER — Encounter (INDEPENDENT_AMBULATORY_CARE_PROVIDER_SITE_OTHER): Payer: Self-pay

## 2013-04-16 VITALS — BP 130/86 | HR 56 | Temp 97.6°F | Resp 16 | Ht 74.0 in | Wt 229.8 lb

## 2013-04-16 DIAGNOSIS — R599 Enlarged lymph nodes, unspecified: Secondary | ICD-10-CM

## 2013-04-16 DIAGNOSIS — K626 Ulcer of anus and rectum: Secondary | ICD-10-CM

## 2013-04-16 DIAGNOSIS — R197 Diarrhea, unspecified: Secondary | ICD-10-CM | POA: Insufficient documentation

## 2013-04-16 DIAGNOSIS — R590 Localized enlarged lymph nodes: Secondary | ICD-10-CM | POA: Insufficient documentation

## 2013-04-16 DIAGNOSIS — M109 Gout, unspecified: Secondary | ICD-10-CM | POA: Insufficient documentation

## 2013-04-16 HISTORY — DX: Localized enlarged lymph nodes: R59.0

## 2013-04-16 NOTE — Patient Instructions (Addendum)
WOUND CARE  It is important that the wound be kept open.   -Keeping the skin edges apart will allow the wound to gradually heal from the base upwards.   - If the skin edges of the wound close too early, a new fluid pocket can form and infection can occur. -This is the reason to pack deeper wounds with gauze or ribbon -This is why drained wounds cannot be sewed closed right away  A healthy wound should form a lining of bright red "beefy" granulating tissue that will help shrink the wound and help the edges grow new skin into it.   -A little mucus / yellow discharge is normal (the body's natural way to try and form a scab) and should be gently washed off with soap and water with daily dressing changes.  -Green or foul smelling drainage implies bacterial colonization and can slow wound healing - a short course of antibiotic ointment (3-5 days) can help it clear up.  Call the doctor if it does not improve or worsens  -Avoid use of antibiotic ointments for more than a week as they can slow wound healing over time.    -Sometimes other wound care products will be used to reduce need for dressing changes and/or help clean up dirty wounds -Sometimes the surgeon needs to debride the wound in the office to remove dead or infected tissue out of the wound so it can heal more quickly and safely.    Change the dressing at least once a day -Wash the wound with mild soap and water gently every day.  It is good to shower or bathe the wound to help it clean out. -Use 1/4" ribbon plain NU-gauze for small wounds (it does not need to be sterile, just clean) -Keep the skin dry around the wound to prevent breakdown and irritation. -Pack the wound down to the base (1/2" deep) -The goal is to keep the skin apart, not overpack the wound -Use a Q-tip or blunt-tipped kabob stick toothpick to push the gauze down to the base in narrow or deep wounds   -Cover with a clean gauze and tape -paper or Medipore tape tend to be  gentle on the skin -rotate the orientation of the tape to avoid repeated stress/trauma on the skin -using an ACE or Coban wrap on wounds on arms or legs can be used instead.  Returning the see the surgeon is helpful to follow the healing process and help the wound close as fast as possible.  ANORECTAL SURGERY: POST OP INSTRUCTIONS  1. Take your usually prescribed home medications unless otherwise directed. 2. DIET: Follow a light bland diet the first 24 hours after arrival home, such as soup, liquids, crackers, etc.  Be sure to include lots of fluids daily.  Avoid fast food or heavy meals as your are more likely to get nauseated.  Eat a low fat the next few days after surgery.   3. PAIN CONTROL: a. Pain is best controlled by a usual combination of three different methods TOGETHER: i. Ice/Heat ii. Over the counter pain medication iii. Prescription pain medication b. Most patients will experience some swelling and discomfort in the anus/rectal area. and incisions.  Ice packs or heat (30-60 minutes up to 6 times a day) will help. Use ice for the first few days to help decrease swelling and bruising, then switch to heat such as warm towels, sitz baths, warm baths, etc to help relax tight/sore spots and speed recovery.  Some people prefer  to use ice alone, heat alone, alternating between ice & heat.  Experiment to what works for you.  Swelling and bruising can take several weeks to resolve.   c. It is helpful to take an over-the-counter pain medication regularly for the first few weeks.  Choose one of the following that works best for you: i. Naproxen (Aleve, etc)  Two 220mg  tabs twice a day ii. Ibuprofen (Advil, etc) Three 200mg  tabs four times a day (every meal & bedtime) iii. Acetaminophen (Tylenol, etc) 500-650mg  four times a day (every meal & bedtime) d. A  prescription for pain medication (such as oxycodone, hydrocodone, etc) should be given to you upon discharge.  Take your pain medication as  prescribed.  i. If you are having problems/concerns with the prescription medicine (does not control pain, nausea, vomiting, rash, itching, etc), please call us [phone] to see if we need to switch you to a different pain medicine that will work better for you and/or control your side effect better. ii. If you need a refill on your pain medication, please contact your pharmacy.  They will contact our office to request authorization. Prescriptions will not be filled after 5 pm or on week-ends.  Use a Sitz Bath 4-8 times a day for relief A sitz bath is a warm water bath taken in the sitting position that covers only the hips and buttocks. It may be used for either healing or hygiene purposes. Sitz baths are also used to relieve pain, itching, or muscle spasms. The water may contain medicine. Moist heat will help you heal and relax.  HOME CARE INSTRUCTIONS  Take 3 to 4 sitz baths a day. 1. Fill the bathtub half full with warm water. 2. Sit in the water and open the drain a little. 3. Turn on the warm water to keep the tub half full. Keep the water running constantly. 4. Soak in the water for 15 to 20 minutes. 5. After the sitz bath, pat the affected area dry first. SEEK MEDICAL CARE IF:  You get worse instead of better. Stop the sitz baths if you get worse.   4. KEEP YOUR BOWELS REGULAR a. The goal is one bowel movement a day b. Avoid getting constipated.  Between the surgery and the pain medications, it is common to experience some constipation.  Increasing fluid intake and taking a fiber supplement (such as Metamucil, Citrucel, FiberCon, MiraLax, etc) 1-2 times a day regularly will usually help prevent this problem from occurring.  A mild laxative (prune juice, Milk of Magnesia, MiraLax, etc) should be taken according to package directions if there are no bowel movements after 48 hours. c. Watch out for diarrhea.  If you have many loose bowel movements, simplify your diet to bland foods &  liquids for a few days.  Stop any stool softeners and decrease your fiber supplement.  Switching to mild anti-diarrheal medications (Kayopectate, Pepto Bismol) can help.  If this worsens or does not improve, please call us.  5. Wound Care a. Remove your bandages the day after surgery.  Unless discharge instructions indicate otherwise, leave your bandage dry and in place overnight.  Remove the bandage during your first bowel movement.   b. Allow the wound packing to fall out over the next few days.  You can trim exposed gauze / ribbon as it falls out.  You do not need to repack the wound unless instructed otherwise.  Wear an absorbent pad or soft cotton gauze in your underwear as needed  to catch any drainage and help keep the area  c. Keep the area clean and dry.  Bathe / shower every day.  Keep the area clean by showering / bathing over the incision / wound.   It is okay to soak an open wound to help wash it.  Wet wipes or showers / gentle washing after bowel movements is often less traumatic than regular toilet paper. d. Bonita QuinYou may have some styrofoam-like soft packing in the rectum which will come out with the first bowel movement.  e. You will often notice bleeding with bowel movements.  This should slow down by the end of the first week of surgery f. Expect some drainage.  This should slow down, too, by the end of the first week of surgery.  Wear an absorbent pad or soft cotton gauze in your underwear until the drainage stops. 6. ACTIVITIES as tolerated:   a. You may resume regular (light) daily activities beginning the next day-such as daily self-care, walking, climbing stairs-gradually increasing activities as tolerated.  If you can walk 30 minutes without difficulty, it is safe to try more intense activity such as jogging, treadmill, bicycling, low-impact aerobics, swimming, etc. b. Save the most intensive and strenuous activity for last such as sit-ups, heavy lifting, contact sports, etc  Refrain  from any heavy lifting or straining until you are off narcotics for pain control.   c. DO NOT PUSH THROUGH PAIN.  Let pain be your guide: If it hurts to do something, don't do it.  Pain is your body warning you to avoid that activity for another week until the pain goes down. d. You may drive when you are no longer taking prescription pain medication, you can comfortably sit for long periods of time, and you can safely maneuver your car and apply brakes. e. Bonita QuinYou may have sexual intercourse when it is comfortable.  7. FOLLOW UP in our office a. Please call [facility] at [phone] to set up an appointment to see your surgeon in the office for a follow-up appointment approximately 2 weeks after your surgery. b. Make sure that you call for this appointment the day you arrive home to insure a convenient appointment time. 10. IF YOU HAVE DISABILITY OR FAMILY LEAVE FORMS, BRING THEM TO THE OFFICE FOR PROCESSING.  DO NOT GIVE THEM TO YOUR DOCTOR.        WHEN TO CALL US [phone]: 1. Poor pain control 2. Reactions / problems with new medications (rash/itching, nausea, etc)  3. Fever over 101.5 F (38.5 C) 4. Inability to urinate 5. Nausea and/or vomiting 6. Worsening swelling or bruising 7. Continued bleeding from incision. 8. Increased pain, redness, or drainage from the incision  The clinic staff is available to answer your questions during regular business hours (8:30am-5pm).  Please don't hesitate to call and ask to speak to one of our nurses for clinical concerns.   [facility] is always on call at the hospitals   If you have a medical emergency, go to the nearest emergency room or call 911.    [facility] [address], [address], [place], [place]  [id number] ? MAIN: [phone] ? TOLL FREE: [phone] ? FAX [phone] [email or web address]     GETTING TO GOOD BOWEL HEALTH. Irregular bowel habits such as  constipation and diarrhea can lead to many problems over time.  Having one soft bowel movement a day is the most important way to prevent further  problems.  The anorectal canal is designed to handle stretching and feces to safely manage our ability to get rid of solid waste (feces, poop, stool) out of our body.  BUT, hard constipated stools can act like ripping concrete bricks and diarrhea can be a burning fire to this very sensitive area of our body, causing inflamed hemorrhoids, anal fissures, increasing risk is perirectal abscesses, abdominal pain/bloating, an making irritable bowel worse.     The goal: ONE SOFT BOWEL MOVEMENT A DAY!  To have soft, regular bowel movements:    Drink at least 8 tall glasses of water a day.     Take plenty of fiber.  Fiber is the undigested part of plant food that passes into the colon, acting s "natures broom" to encourage bowel motility and movement.  Fiber can absorb and hold large amounts of water. This results in a larger, bulkier stool, which is soft and easier to pass. Work gradually over several weeks up to 6 servings a day of fiber (25g a day even more if needed) in the form of: o Vegetables -- Root (potatoes, carrots, turnips), leafy green (lettuce, salad greens, celery, spinach), or cooked high residue (cabbage, broccoli, etc) o Fruit -- Fresh (unpeeled skin & pulp), Dried (prunes, apricots, cherries, etc ),  or stewed ( applesauce)  o Whole grain breads, pasta, etc (whole wheat)  o Bran cereals    Bulking Agents -- This type of water-retaining fiber generally is easily obtained each day by one of the following:  o Psyllium bran -- The psyllium plant is remarkable because its ground seeds can retain so much water. This product is available as Metamucil, Konsyl, Effersyllium, Per Diem Fiber, or the less expensive generic preparation in drug and health food stores. Although labeled a laxative, it really is not a laxative.  o Methylcellulose -- This is another  fiber derived from wood which also retains water. It is available as Citrucel. o Polyethylene Glycol - and "artificial" fiber commonly called Miralax or Glycolax.  It is helpful for people with gassy or bloated feelings with regular fiber o Flax Seed - a less gassy fiber than psyllium   No reading or other relaxing activity while on the toilet. If bowel movements take longer than 5 minutes, you are too constipated   AVOID CONSTIPATION.  High fiber and water intake usually takes care of this.  Sometimes a laxative is needed to stimulate more frequent bowel movements, but    Laxatives are not a good long-term solution as it can wear the colon out. o Osmotics (Milk of Magnesia, Fleets phosphosoda, Magnesium citrate, MiraLax, GoLytely) are safer than  o Stimulants (Senokot, Castor Oil, Dulcolax, Ex Lax)    o Do not take laxatives for more than 7days in a row.    IF SEVERELY CONSTIPATED, try a Bowel Retraining Program: o Do not use laxatives.  o Eat a diet high in roughage, such as bran cereals and leafy vegetables.  o Drink six (6) ounces of prune or apricot juice each morning.  o Eat two (2) large servings of stewed fruit each day.  o Take one (1) heaping tablespoon of a psyllium-based bulking agent twice a day. Use sugar-free sweetener when possible to avoid excessive calories.  o Eat a normal breakfast.  o Set aside 15 minutes after breakfast to sit on the toilet, but do not strain to have a bowel movement.  o If you do not have a bowel movement by the third day, use an  enema and repeat the above steps.    Controlling diarrhea o Switch to liquids and simpler foods for a few days to avoid stressing your intestines further. o Avoid dairy products (especially milk & ice cream) for a short time.  The intestines often can lose the ability to digest lactose when stressed. o Avoid foods that cause gassiness or bloating.  Typical foods include beans and other legumes, cabbage, broccoli, and dairy foods.   Every person has some sensitivity to other foods, so listen to our body and avoid those foods that trigger problems for you. o Adding fiber (Citrucel, Metamucil, psyllium, Miralax) gradually can help thicken stools by absorbing excess fluid and retrain the intestines to act more normally.  Slowly increase the dose over a few weeks.  Too much fiber too soon can backfire and cause cramping & bloating. o Probiotics (such as active yogurt, Align, etc) may help repopulate the intestines and colon with normal bacteria and calm down a sensitive digestive tract.  Most studies show it to be of mild help, though, and such products can be costly. o Medicines:   Bismuth subsalicylate (ex. Kayopectate, Pepto Bismol) every 30 minutes for up to 6 doses can help control diarrhea.  Avoid if pregnant.   Loperamide (Immodium) can slow down diarrhea.  Start with two tablets (4mg  total) first and then try one tablet every 6 hours.  Avoid if you are having fevers or severe pain.  If you are not better or start feeling worse, stop all medicines and call your doctor for advice o Call your doctor if you are getting worse or not better.  Sometimes further testing (cultures, endoscopy, X-ray studies, bloodwork, etc) may be needed to help diagnose and treat the cause of the diarrhea.  Diarrhea Diarrhea is frequent loose and watery bowel movements. It can cause you to feel weak and dehydrated. Dehydration can cause you to become tired and thirsty, have a dry mouth, and have decreased urination that often is dark yellow. Diarrhea is a sign of another problem, most often an infection that will not last long. In most cases, diarrhea typically lasts 2 3 days. However, it can last longer if it is a sign of something more serious. It is important to treat your diarrhea as directed by your caregive to lessen or prevent future episodes of diarrhea. CAUSES  Some common causes include:  Gastrointestinal infections caused by viruses,  bacteria, or parasites.  Food poisoning or food allergies.  Certain medicines, such as antibiotics, chemotherapy, and laxatives.  Artificial sweeteners and fructose.  Digestive disorders. HOME CARE INSTRUCTIONS  Ensure adequate fluid intake (hydration): have 1 cup (8 oz) of fluid for each diarrhea episode. Avoid fluids that contain simple sugars or sports drinks, fruit juices, whole milk products, and sodas. Your urine should be clear or pale yellow if you are drinking enough fluids. Hydrate with an oral rehydration solution that you can purchase at pharmacies, retail stores, and online. You can prepare an oral rehydration solution at home by mixing the following ingredients together:    tsp table salt.   tsp baking soda.   tsp salt substitute containing potassium chloride.  1  tablespoons sugar.  1 L (34 oz) of water.  Certain foods and beverages may increase the speed at which food moves through the gastrointestinal (GI) tract. These foods and beverages should be avoided and include:  Caffeinated and alcoholic beverages.  High-fiber foods, such as raw fruits and vegetables, nuts, seeds, and whole grain breads and  cereals.  Foods and beverages sweetened with sugar alcohols, such as xylitol, sorbitol, and mannitol.  Some foods may be well tolerated and may help thicken stool including:  Starchy foods, such as rice, toast, pasta, low-sugar cereal, oatmeal, grits, baked potatoes, crackers, and bagels.  Bananas.  Applesauce.  Add probiotic-rich foods to help increase healthy bacteria in the GI tract, such as yogurt and fermented milk products.  Wash your hands well after each diarrhea episode.  Only take over-the-counter or prescription medicines as directed by your caregiver.  Take a warm bath to relieve any burning or pain from frequent diarrhea episodes. SEEK IMMEDIATE MEDICAL CARE IF:   You are unable to keep fluids down.  You have persistent vomiting.  You have  blood in your stool, or your stools are black and tarry.  You do not urinate in 6 8 hours, or there is only a small amount of very dark urine.  You have abdominal pain that increases or localizes.  You have weakness, dizziness, confusion, or lightheadedness.  You have a severe headache.  Your diarrhea gets worse or does not get better.  You have a fever or persistent symptoms for more than 2 3 days.  You have a fever and your symptoms suddenly get worse. MAKE SURE YOU:   Understand these instructions.  Will watch your condition.  Will get help right away if you are not doing well or get worse. Document Released: 03/03/2002 Document Revised: 02/28/2012 Document Reviewed: 11/19/2011 [facility] Patient Information 2014 [place], [place].

## 2013-04-16 NOTE — Progress Notes (Signed)
Subjective:     Patient ID: Donald Grant, male   DOB: 12-17-1975, 38 y.o.   MRN: [id number]  HPI   Donald Grant  12-17-1975 [id number]  Patient Care Team: Carolyne FiscalPeter F Blomgren, MD as PCP - General (Family Medicine) Hillis RangeJames Allred, MD as Consulting Physician (Cardiology)  Procedure (Date: 12/19/2012):  Examination under anesthesia, excision of chronic anal sinus tract.   Pathology consistent with fistulous-like tract.  No Crohn's.  No cancer.  This patient returns for surgical re-evaluation.  He is feeling worse.  Has had some neck swelling.  Diagnosed with gout.  Started on allopurinol and cholestyramine colchicine.  Has had some diarrhea.  Feels like the wound is more sore.  Not horrible drainage but occasional blood.  Not packing the wound.  well.  No longer needing to pack the area.  He noticed very minimal yellow mucus when he wipes.  No incontinence to flatus or stool.  No pain.  Using Metamucil fiber for a bowel regimen.  No sweats/chills.  Daily bowel movements.  Energy level rather good.  Not on any antibiotics. Minimal bleeding.  Patient Active Problem List   Diagnosis Date Noted  . Essential hypertension 12/04/2012  . 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 PVC's (premature ventricular contractions) 01/25/2011    Past Medical History  Diagnosis Date  . Recurrent upper respiratory infection (URI)   . GERD (gastroesophageal reflux disease)   . Dysrhythmia     atrial flutter  . Dyslipidemia   . Atrial flutter     CTI ablation by Dr Ladona Ridgelaylor 02/24/11  . Atrial tachycardia     mapped to AV node by Dr Ladona Ridgelaylor, ablation not performed  . Atrial fibrillation   . Premature ventricular contraction   . First degree AV block   . Gout     bilateral feet    Past Surgical History  Procedure Laterality Date  . Root canal    . Mouth surgery    . Atrial ablation surgery  02/24/11    CTI ablation by Dr Ladona Ridgelaylor  . Cardioversion (bedside)  03/29/2011         . Cardioversion  03/29/2011    Procedure: CARDIOVERSION;  Surgeon: Lewayne BuntingBrian S Crenshaw, MD;  [facility];  Service: Cardiovascular;  Laterality: N/A;  . Examination under anesthesia  12/19/12    Anal Fistula    History   Social History  . Marital Status: Married    Spouse Name: N/A    Number of Children: N/A  . Years of Education: N/A   Occupational History  . Not on file.   Social History Main Topics  . Smoking status: Former Smoker    Quit date: 08/28/2010  . Smokeless tobacco: Not on file  . Alcohol Use: Yes     Comment: drinks beer 3 to 4 times a week  . Drug Use: Yes    Special: Marijuana     Comment: still currently smoking  . Sexual Activity: Yes   Other Topics Concern  . Not on file   Social History Narrative   His additional social history is notable that the patient exercises     several times a week, jogging up to 2 miles a day.  With exercise, he     notes that he has lost over 10 pounds.     Family History  Problem Relation Age of Onset  . Heart disease Mother   . Achondroplasia Mother   . Alcohol abuse Mother   .  Achondroplasia Son     Current Outpatient Prescriptions  Medication Sig Dispense Refill  . allopurinol (ZYLOPRIM) 300 MG tablet Take 300 mg by mouth daily.      [name] aspirin 325 MG tablet Take 325 mg by mouth daily.      [name] atorvastatin (LIPITOR) 40 MG tablet Take 20 mg by mouth daily.       [name] b complex vitamins capsule Take 1 capsule by mouth daily.       . colchicine 0.6 MG tablet Take 0.6 mg by mouth 2 (two) times daily.      . fish oil-omega-3 fatty acids 1000 MG capsule Take 2 g by mouth daily.        . Multiple Vitamin (MULITIVITAMIN WITH MINERALS) TABS Take 1 tablet by mouth daily.        [name] acyclovir (ZOVIRAX) 400 MG tablet 400 mg as needed.        No current facility-administered medications for this visit.     No Known Allergies  BP 130/86  Pulse 56  Temp(Src) 97.6 F (36.4 C) (Temporal)  Resp 16  Ht 6\' 2"  (1.88 m)  Wt 229  lb 12.8 oz (104.237 kg)  BMI 29.49 kg/m2  No results found.   Review of Systems  Constitutional: Negative for fever, chills and diaphoresis.  HENT: Negative for sore throat and trouble swallowing.   Eyes: Negative for photophobia and visual disturbance.  Respiratory: Negative for choking and shortness of breath.   Cardiovascular: Negative for chest pain and palpitations.  Gastrointestinal: Negative for nausea, vomiting, abdominal distention, anal bleeding and rectal pain.  Genitourinary: Negative for dysuria, urgency, difficulty urinating and testicular pain.  Musculoskeletal: Negative for arthralgias, gait problem, myalgias and neck pain.  Skin: Positive for wound. Negative for color change and rash.  Neurological: Negative for dizziness, speech difficulty, weakness and numbness.  Hematological: Negative for adenopathy.  Psychiatric/Behavioral: Negative for hallucinations, confusion and agitation.       Objective:   Physical Exam  Constitutional: He is oriented to person, place, and time. He appears well-developed and well-nourished. No distress.  HENT:  Head: Normocephalic.  Mouth/Throat: Oropharynx is clear and moist. No oropharyngeal exudate.  Eyes: Conjunctivae and EOM are normal. Pupils are equal, round, and reactive to light. No scleral icterus.  Neck: Normal range of motion. No tracheal deviation present.  Cardiovascular: Normal rate, normal heart sounds and intact distal pulses.   Pulmonary/Chest: Effort normal. No respiratory distress.  Abdominal: Soft. He exhibits no distension. There is no tenderness. Hernia confirmed negative in the right inguinal area and confirmed negative in the left inguinal area.  Incisions clean with normal healing ridges.  No hernias  Genitourinary:     Musculoskeletal: Normal range of motion. He exhibits no tenderness.  Lymphadenopathy:       Head (right side): Submental, submandibular and tonsillar adenopathy present.    He has cervical  adenopathy.       Right cervical: No superficial cervical and no deep cervical adenopathy present.      Left cervical: No superficial cervical and no deep cervical adenopathy present.    He has no axillary adenopathy.       Right: No inguinal adenopathy present.       Left: No inguinal adenopathy present.  Neurological: He is alert and oriented to person, place, and time. No cranial nerve deficit. He exhibits normal muscle tone. Coordination normal.  Skin: Skin is warm and dry. No rash noted. He is not diaphoretic.  Psychiatric:  He has a normal mood and affect. His behavior is normal.       Assessment:     Recovering 4 months status post excision of chronic perirectal sinus.  No proof of fistula.  Pathology negative for Crohn's or malignancy.     Plan:     More aggressive wound care.    I would pack into the wound daily to catch any drainage.  I do not think any more deep/aggressive packing is needed at this time.  Activity as tolerated.  Low impact exercise such as walking an hour a day at least ideal.  Do not push through pain.  Diet as tolerated.  Low fat high fiber diet ideal.  Bowel regimen with 30 g fiber a day and fiber supplement as needed to avoid problems.  Continue the Metamucil as that seems to be working well for him  Return to clinic q2 4 weeks until closed.   Instructions discussed.  Followup with primary care physician for other health issues as would normally be done.  Questions answered.  The patient expressed understanding and appreciation

## 2013-05-09 ENCOUNTER — Encounter (INDEPENDENT_AMBULATORY_CARE_PROVIDER_SITE_OTHER): Payer: Self-pay | Admitting: Surgery

## 2013-05-09 ENCOUNTER — Ambulatory Visit (INDEPENDENT_AMBULATORY_CARE_PROVIDER_SITE_OTHER): Payer: BC Managed Care – PPO | Admitting: Surgery

## 2013-05-09 VITALS — BP 128/60 | HR 56 | Temp 98.0°F | Resp 14 | Ht 74.0 in | Wt 237.2 lb

## 2013-05-09 DIAGNOSIS — K626 Ulcer of anus and rectum: Secondary | ICD-10-CM

## 2013-05-09 NOTE — Patient Instructions (Signed)
ANORECTAL SURGERY: POST OP INSTRUCTIONS  1. Take your usually prescribed home medications unless otherwise directed. 2. DIET: Follow a light bland diet the first 24 hours after arrival home, such as soup, liquids, crackers, etc.  Be sure to include lots of fluids daily.  Avoid fast food or heavy meals as your are more likely to get nauseated.  Eat a low fat the next few days after surgery.   3. PAIN CONTROL: a. Pain is best controlled by a usual combination of three different methods TOGETHER: i. Ice/Heat ii. Over the counter pain medication iii. Prescription pain medication b. Most patients will experience some swelling and discomfort in the anus/rectal area. and incisions.  Ice packs or heat (30-60 minutes up to 6 times a day) will help. Use ice for the first few days to help decrease swelling and bruising, then switch to heat such as warm towels, sitz baths, warm baths, etc to help relax tight/sore spots and speed recovery.  Some people prefer to use ice alone, heat alone, alternating between ice & heat.  Experiment to what works for you.  Swelling and bruising can take several weeks to resolve.   c. It is helpful to take an over-the-counter pain medication regularly for the first few weeks.  Choose one of the following that works best for you: i. Naproxen (Aleve, etc)  Two 220mg tabs twice a day ii. Ibuprofen (Advil, etc) Three 200mg tabs four times a day (every meal & bedtime) iii. Acetaminophen (Tylenol, etc) 500-650mg four times a day (every meal & bedtime) d. A  prescription for pain medication (such as oxycodone, hydrocodone, etc) should be given to you upon discharge.  Take your pain medication as prescribed.  i. If you are having problems/concerns with the prescription medicine (does not control pain, nausea, vomiting, rash, itching, etc), please call us [phone] to see if we need to switch you to a different pain medicine that will work better for you and/or control your side effect  better. ii. If you need a refill on your pain medication, please contact your pharmacy.  They will contact our office to request authorization. Prescriptions will not be filled after 5 pm or on week-ends.  Use a Sitz Bath 4-8 times a day for relief A sitz bath is a warm water bath taken in the sitting position that covers only the hips and buttocks. It may be used for either healing or hygiene purposes. Sitz baths are also used to relieve pain, itching, or muscle spasms. The water may contain medicine. Moist heat will help you heal and relax.  HOME CARE INSTRUCTIONS  Take 3 to 4 sitz baths a day. 1. Fill the bathtub half full with warm water. 2. Sit in the water and open the drain a little. 3. Turn on the warm water to keep the tub half full. Keep the water running constantly. 4. Soak in the water for 15 to 20 minutes. 5. After the sitz bath, pat the affected area dry first. SEEK MEDICAL CARE IF:  You get worse instead of better. Stop the sitz baths if you get worse.   4. KEEP YOUR BOWELS REGULAR a. The goal is one bowel movement a day b. Avoid getting constipated.  Between the surgery and the pain medications, it is common to experience some constipation.  Increasing fluid intake and taking a fiber supplement (such as Metamucil, Citrucel, FiberCon, MiraLax, etc) 1-2 times a day regularly will usually help prevent this problem from occurring.  A mild   laxative (prune juice, Milk of Magnesia, MiraLax, etc) should be taken according to package directions if there are no bowel movements after 48 hours. c. Watch out for diarrhea.  If you have many loose bowel movements, simplify your diet to bland foods & liquids for a few days.  Stop any stool softeners and decrease your fiber supplement.  Switching to mild anti-diarrheal medications (Kayopectate, Pepto Bismol) can help.  If this worsens or does not improve, please call us.  5. Wound Care a. Remove your bandages the day after surgery.  Unless  discharge instructions indicate otherwise, leave your bandage dry and in place overnight.  Remove the bandage during your first bowel movement.   b. Allow the wound packing to fall out over the next few days.  You can trim exposed gauze / ribbon as it falls out.  You do not need to repack the wound unless instructed otherwise.  Wear an absorbent pad or soft cotton gauze in your underwear as needed to catch any drainage and help keep the area  c. Keep the area clean and dry.  Bathe / shower every day.  Keep the area clean by showering / bathing over the incision / wound.   It is okay to soak an open wound to help wash it.  Wet wipes or showers / gentle washing after bowel movements is often less traumatic than regular toilet paper. d. You may have some styrofoam-like soft packing in the rectum which will come out with the first bowel movement.  e. You will often notice bleeding with bowel movements.  This should slow down by the end of the first week of surgery f. Expect some drainage.  This should slow down, too, by the end of the first week of surgery.  Wear an absorbent pad or soft cotton gauze in your underwear until the drainage stops. 6. ACTIVITIES as tolerated:   a. You may resume regular (light) daily activities beginning the next day-such as daily self-care, walking, climbing stairs-gradually increasing activities as tolerated.  If you can walk 30 minutes without difficulty, it is safe to try more intense activity such as jogging, treadmill, bicycling, low-impact aerobics, swimming, etc. b. Save the most intensive and strenuous activity for last such as sit-ups, heavy lifting, contact sports, etc  Refrain from any heavy lifting or straining until you are off narcotics for pain control.   c. DO NOT PUSH THROUGH PAIN.  Let pain be your guide: If it hurts to do something, don't do it.  Pain is your body warning you to avoid that activity for another week until the pain goes down. d. You may drive when  you are no longer taking prescription pain medication, you can comfortably sit for long periods of time, and you can safely maneuver your car and apply brakes. e. You may have sexual intercourse when it is comfortable.  7. FOLLOW UP in our office a. Please call [facility] at [phone] to set up an appointment to see your surgeon in the office for a follow-up appointment approximately 2 weeks after your surgery. b. Make sure that you call for this appointment the day you arrive home to insure a convenient appointment time. 10. IF YOU HAVE DISABILITY OR FAMILY LEAVE FORMS, BRING THEM TO THE OFFICE FOR PROCESSING.  DO NOT GIVE THEM TO YOUR DOCTOR.        WHEN TO CALL US [phone]: 1. Poor pain control 2. Reactions / problems with new medications (rash/itching, nausea, etc)    3. Fever over 101.5 F (38.5 C) 4. Inability to urinate 5. Nausea and/or vomiting 6. Worsening swelling or bruising 7. Continued bleeding from incision. 8. Increased pain, redness, or drainage from the incision  The clinic staff is available to answer your questions during regular business hours (8:30am-5pm).  Please don't hesitate to call and ask to speak to one of our nurses for clinical concerns.   A surgeon from [facility] is always on call at the hospitals   If you have a medical emergency, go to the nearest emergency room or call 911.    [facility] [address], [place], [place]  [address] ? MAIN: [phone] ? TOLL FREE: [phone] ? FAX [phone] [email or web address]

## 2013-05-09 NOTE — Progress Notes (Signed)
Subjective:     Patient ID: Donald Grant, male   DOB: 09-25-1975, 38 y.o.   MRN: [id number]  HPI   [name]  [birth year] [id number]  Patient Care Team: [name], MD as PCP - General (Family Medicine) [name], MD as Consulting Physician (Cardiology)  Procedure (Date: 12/19/2012):  Examination under anesthesia, excision of chronic anal sinus tract.   Pathology consistent with fistulous-like tract.  No Crohn's.  No cancer.  This patient returns for surgical re-evaluation.  He is feeling better.  He thinks the wound is closedt horrible drainage but occasional blood.  Not needing to pack the area.   No incontinence to flatus or stool.  No pain.  Using Metamucil fiber for a bowel regimen.  No sweats/chills.  Daily bowel movements.  Energy level rather good.  Not on any antibiotics. Minimal bleeding.  Patient Active Problem List   Diagnosis Date Noted  . Gout 04/16/2013  . Lymphadenopathy of right cervical region ?[place] 04/16/2013  . Diarrhea 04/16/2013  . Essential hypertension 12/04/2012  . 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 PVC's (premature ventricular contractions) 01/25/2011    Past Medical History  Diagnosis Date  . Recurrent upper respiratory infection (URI)   . GERD (gastroesophageal reflux disease)   . Dysrhythmia     atrial flutter  . Dyslipidemia   . Atrial flutter     CTI ablation by Dr [name] 02/24/11  . Atrial tachycardia     mapped to AV node by Dr [name], ablation not performed  . Atrial fibrillation   . Premature ventricular contraction   . First degree AV block   . Gout     bilateral feet    Past Surgical History  Procedure Laterality Date  . Root canal    . Mouth surgery    . Atrial ablation surgery  02/24/11    CTI ablation by Dr [name]  . Cardioversion (bedside)  03/29/2011       . Cardioversion  03/29/2011    Procedure: CARDIOVERSION;  Surgeon: [name], MD;  Location: [facility] OR;  Service:  Cardiovascular;  Laterality: N/A;  . Examination under anesthesia  12/19/12    Anal Fistula    History   Social History  . Marital Status: Married    Spouse Name: N/A    Number of Children: N/A  . Years of Education: N/A   Occupational History  . Not on file.   Social History Main Topics  . Smoking status: Former Smoker    Quit date: 08/28/2010  . Smokeless tobacco: Not on file  . Alcohol Use: Yes     Comment: drinks beer 3 to 4 times a week  . Drug Use: Yes    Special: Marijuana     Comment: still currently smoking  . Sexual Activity: Yes   Other Topics Concern  . Not on file   Social History Narrative   His additional social history is notable that the patient exercises     several times a week, jogging up to 2 miles a day.  With exercise, he     notes that he has lost over 10 pounds.     Family History  Problem Relation Age of Onset  . Heart disease Mother   . Achondroplasia Mother   . Alcohol abuse Mother   . Achondroplasia Son     Current Outpatient Prescriptions  Medication Sig Dispense Refill  . acyclovir (ZOVIRAX) 400 MG tablet 400 mg as  needed.       [name] allopurinol (ZYLOPRIM) 300 MG tablet Take 300 mg by mouth daily.      [name] aspirin 325 MG tablet Take 325 mg by mouth daily.      [name] atorvastatin (LIPITOR) 40 MG tablet Take 20 mg by mouth daily.       [name] b complex vitamins capsule Take 1 capsule by mouth daily.       . colchicine 0.6 MG tablet Take 0.6 mg by mouth 2 (two) times daily.      . fish oil-omega-3 fatty acids 1000 MG capsule Take 2 g by mouth daily.        . Multiple Vitamin (MULITIVITAMIN WITH MINERALS) TABS Take 1 tablet by mouth daily.         No current facility-administered medications for this visit.     No Known Allergies  BP 128/60  Pulse 56  Temp(Src) 98 F (36.7 C) (Oral)  Resp 14  Ht 6\' 2"  (1.88 m)  Wt 237 lb 3.2 oz (107.593 kg)  BMI 30.44 kg/m2  No results found.   Review of Systems  Constitutional: Negative for fever,  chills, diaphoresis and fatigue.  HENT: Negative for sore throat and trouble swallowing.   Eyes: Negative for photophobia and visual disturbance.  Respiratory: Negative for choking and shortness of breath.   Cardiovascular: Negative for chest pain and palpitations.  Gastrointestinal: Negative for nausea, vomiting, abdominal distention, anal bleeding and rectal pain.  Genitourinary: Negative for dysuria, urgency, difficulty urinating and testicular pain.  Musculoskeletal: Negative for arthralgias, gait problem, myalgias and neck pain.  Skin: Positive for wound. Negative for color change and rash.  Neurological: Negative for dizziness, speech difficulty, weakness and numbness.  Hematological: Negative for adenopathy.  Psychiatric/Behavioral: Negative for hallucinations, confusion and agitation.       Objective:   Physical Exam  Constitutional: He is oriented to person, place, and time. He appears well-developed and well-nourished. No distress.  HENT:  Head: Normocephalic.  Mouth/Throat: Oropharynx is clear and moist. No oropharyngeal exudate.  Eyes: Conjunctivae and EOM are normal. Pupils are equal, round, and reactive to light. No scleral icterus.  Neck: Normal range of motion. No tracheal deviation present.  Cardiovascular: Normal rate, normal heart sounds and intact distal pulses.   Pulmonary/Chest: Effort normal. No respiratory distress.  Abdominal: Soft. He exhibits no distension. There is no tenderness. Hernia confirmed negative in the right inguinal area and confirmed negative in the left inguinal area.  Incisions clean with normal healing ridges.  No hernias  Genitourinary:     Musculoskeletal: Normal range of motion. He exhibits no tenderness.  Lymphadenopathy:       Head (right side): No submental, no submandibular and no tonsillar adenopathy present.       Head (left side): No submental, no submandibular and no tonsillar adenopathy present.    He has no cervical  adenopathy.       Right cervical: No superficial cervical and no deep cervical adenopathy present.      Left cervical: No superficial cervical and no deep cervical adenopathy present.    He has no axillary adenopathy.       Right: No inguinal adenopathy present.       Left: No inguinal adenopathy present.  Neurological: He is alert and oriented to person, place, and time. No cranial nerve deficit. He exhibits normal muscle tone. Coordination normal.  Skin: Skin is warm and dry. No rash noted. He is not diaphoretic.  Psychiatric: He  has a normal mood and affect. His behavior is normal.       Assessment:     Recovering 5 months status post excision of chronic perirectal sinus.  No proof of fistula.  Pathology negative for Crohn's or malignancy.     Plan:     I am glad it is nearly closed.  He feels positive as well.  I do not think any more deep/aggressive packing is needed at this time.  Activity as tolerated.  Low impact exercise such as walking an hour a day at least ideal.  Do not push through pain.  Diet as tolerated.  Low fat high fiber diet ideal.  Bowel regimen with 30 g fiber a day and fiber supplement as needed to avoid problems.  Continue the Metamucil as that seems to be working well for him  Return to clinic q 3-4 weeks until closed.   Instructions discussed.  Followup with primary care physician for other health issues as would normally be done.  Questions answered.  The patient expressed understanding and appreciation

## 2013-06-02 ENCOUNTER — Encounter (INDEPENDENT_AMBULATORY_CARE_PROVIDER_SITE_OTHER): Payer: Self-pay | Admitting: Surgery

## 2013-06-02 ENCOUNTER — Ambulatory Visit (INDEPENDENT_AMBULATORY_CARE_PROVIDER_SITE_OTHER): Payer: BC Managed Care – PPO | Admitting: Surgery

## 2013-06-02 VITALS — BP 110/80 | HR 72 | Temp 97.9°F | Resp 14 | Ht 74.0 in | Wt 241.8 lb

## 2013-06-02 DIAGNOSIS — K626 Ulcer of anus and rectum: Secondary | ICD-10-CM

## 2013-06-02 NOTE — Progress Notes (Signed)
Subjective:     Patient ID: Donald Grant, male   DOB: Dec 05, 1975, 38 y.o.   MRN: [id number]  HPI   HUDSEN FEI  12-11-1975 [id number]  Patient Care Team: [name], MD as PCP - General (Family Medicine) [name], MD as Consulting Physician (Cardiology)  Procedure (Date: 12/19/2012):  Examination under anesthesia, excision of chronic anal sinus tract.   Pathology consistent with fistulous-like tract.  No Crohn's.  No cancer.  This patient returns for surgical re-evaluation.  He is a little anxious.  Some soreness in the area.  Minimal drainage.  No blood.  No incontinence to flatus or stool.  No pain.  Using Metamucil fiber for a bowel regimen.  No sweats/chills.  Daily bowel movements.  Energy level rather good.  Not on any antibiotics.   Patient Active Problem List   Diagnosis Date Noted  . Gout 04/16/2013  . Lymphadenopathy of right cervical region ?Mono 04/16/2013  . Diarrhea 04/16/2013  . Essential hypertension 12/04/2012  . 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 PVC's (premature ventricular contractions) 01/25/2011    Past Medical History  Diagnosis Date  . Recurrent upper respiratory infection (URI)   . GERD (gastroesophageal reflux disease)   . Dysrhythmia     atrial flutter  . Dyslipidemia   . Atrial flutter     CTI ablation by Dr [name] 02/24/11  . Atrial tachycardia     mapped to AV node by Dr [name], ablation not performed  . Atrial fibrillation   . Premature ventricular contraction   . First degree AV block   . Gout     bilateral feet  . Lymphadenopathy of right cervical region ?Mono 04/16/2013    Past Surgical History  Procedure Laterality Date  . Root canal    . Mouth surgery    . Atrial ablation surgery  02/24/11    CTI ablation by Dr [name]  . Cardioversion (bedside)  03/29/2011       . Cardioversion  03/29/2011    Procedure: CARDIOVERSION;  Surgeon: [name], MD;  [facility] OR;  Service:  Cardiovascular;  Laterality: N/A;  . Examination under anesthesia  12/19/12    Anal Fistula    History   Social History  . Marital Status: Married    Spouse Name: N/A    Number of Children: N/A  . Years of Education: N/A   Occupational History  . Not on file.   Social History Main Topics  . Smoking status: Former Smoker    Quit date: 08/28/2010  . Smokeless tobacco: Not on file  . Alcohol Use: Yes     Comment: drinks beer 3 to 4 times a week  . Drug Use: Yes    Special: Marijuana     Comment: still currently smoking  . Sexual Activity: Yes   Other Topics Concern  . Not on file   Social History Narrative   His additional social history is notable that the patient exercises     several times a week, jogging up to 2 miles a day.  With exercise, he     notes that he has lost over 10 pounds.     Family History  Problem Relation Age of Onset  . Heart disease Mother   . Achondroplasia Mother   . Alcohol abuse Mother   . Achondroplasia Son     Current Outpatient Prescriptions  Medication Sig Dispense Refill  . acyclovir (ZOVIRAX) 400 MG tablet 400 mg  as needed.       [name] allopurinol (ZYLOPRIM) 300 MG tablet Take 300 mg by mouth daily.      [name] aspirin 325 MG tablet Take 325 mg by mouth daily.      [name] atorvastatin (LIPITOR) 40 MG tablet Take 20 mg by mouth daily.       [name] b complex vitamins capsule Take 1 capsule by mouth daily.       . colchicine 0.6 MG tablet Take 0.6 mg by mouth 2 (two) times daily.      . fish oil-omega-3 fatty acids 1000 MG capsule Take 2 g by mouth daily.        . Multiple Vitamin (MULITIVITAMIN WITH MINERALS) TABS Take 1 tablet by mouth daily.         No current facility-administered medications for this visit.     No Known Allergies  BP 110/80  Pulse 72  Temp(Src) 97.9 F (36.6 C)  Resp 14  Ht 6\' 2"  (1.88 m)  Wt 241 lb 12.8 oz (109.68 kg)  BMI 31.03 kg/m2  No results found.   Review of Systems  Constitutional: Negative for fever,  chills, diaphoresis and fatigue.  HENT: Negative for sore throat and trouble swallowing.   Eyes: Negative for photophobia and visual disturbance.  Respiratory: Negative for choking and shortness of breath.   Cardiovascular: Negative for chest pain and palpitations.  Gastrointestinal: Negative for nausea, vomiting, abdominal distention, anal bleeding and rectal pain.  Genitourinary: Negative for dysuria, urgency, difficulty urinating and testicular pain.  Musculoskeletal: Negative for arthralgias, gait problem, myalgias and neck pain.  Skin: Positive for wound. Negative for color change and rash.  Neurological: Negative for dizziness, speech difficulty, weakness and numbness.  Hematological: Negative for adenopathy.  Psychiatric/Behavioral: Negative for hallucinations, confusion and agitation.       Objective:   Physical Exam  Constitutional: He is oriented to person, place, and time. He appears well-developed and well-nourished. No distress.  HENT:  Head: Normocephalic.  Mouth/Throat: Oropharynx is clear and moist. No oropharyngeal exudate.  Eyes: Conjunctivae and EOM are normal. Pupils are equal, round, and reactive to light. No scleral icterus.  Neck: Normal range of motion. No tracheal deviation present.  Cardiovascular: Normal rate, normal heart sounds and intact distal pulses.   Pulmonary/Chest: Effort normal. No respiratory distress.  Abdominal: Soft. He exhibits no distension. There is no tenderness. Hernia confirmed negative in the right inguinal area and confirmed negative in the left inguinal area.  Incisions clean with normal healing ridges.  No hernias  Genitourinary:     Musculoskeletal: Normal range of motion. He exhibits no tenderness.  Lymphadenopathy:       Head (right side): No submental, no submandibular and no tonsillar adenopathy present.       Head (left side): No submental, no submandibular and no tonsillar adenopathy present.    He has no cervical  adenopathy.       Right cervical: No superficial cervical and no deep cervical adenopathy present.      Left cervical: No superficial cervical and no deep cervical adenopathy present.    He has no axillary adenopathy.       Right: No inguinal adenopathy present.       Left: No inguinal adenopathy present.  Neurological: He is alert and oriented to person, place, and time. No cranial nerve deficit. He exhibits normal muscle tone. Coordination normal.  Skin: Skin is warm and dry. No rash noted. He is not diaphoretic.  Psychiatric: He  has a normal mood and affect. His behavior is normal.       Assessment:     Recovering 6 months status post excision of chronic perirectal sinus.  No proof of fistula.  Pathology negative for Crohn's or malignancy.     Plan:     I am glad it is closed.  He Was worded something is going on, but feels better.There is a small chance he could have something persistent more deeply but everything is closed and I cannot express any pus or purulence.  Sensitive no inflammation or induration.  Reassuring to me.  Last visit and looked very superficial.  Avoid excessive wiping her treatment to the area.  Just what what bowel movements.  Keep area clean and dry.  Patters/cottonball/gauze okay if desired  Activity as tolerated.  Low impact exercise such as walking an hour a day at least ideal.  Do not push through pain.  Diet as tolerated.  Low fat high fiber diet ideal.  Bowel regimen with 30 g fiber a day and fiber supplement as needed to avoid problems.  Continue the Metamucil as that seems to be working well for him  Return to clinic as needed.   Instructions discussed.  Followup with primary care physician for other health issues as would normally be done.  Questions answered.  The patient expressed understanding and appreciation

## 2013-06-02 NOTE — Patient Instructions (Addendum)
I believe that your Anal Sinus Has finally healed!  An anal fistula is an abnormal tunnel that develops between the bowel and skin near the outside of the anus, where feces comes out. The anus has a number of tiny glands that make lubricating fluid. Sometimes these glands can become plugged and infected. This may lead to the development of a fluid-filled pocket (abscess). An anal fistula often develops after this infection or abscess. It is nearly always caused by a past or current anal abscess.  CAUSES  Though an anal fistula is almost always caused by a past or current anal abscess, other causes can include:  A complication of surgery.  Trauma to the rectal area.  Radiation to the area.  Other medical conditions or diseases, such as:   Chronic inflammatory bowel disease, such as Crohn disease or ulcerative colitis.   Colon or rectal cancer.   Diverticular disease, such as diverticulitis.   A sexually transmitted disease, such as gonorrhea, chlamydia, or syphilis.  An HIV infection or AIDS.  SYMPTOMS   Throbbing or constant pain that may be worse when sitting.   Swelling or irritation around the anus.   Drainage of pus or blood from an opening near the anus.   Pain with bowel movements.  Fever or chills. DIAGNOSIS  Your caregiver will examine the area to find the openings of the anal fistula and the fistula tract. The external opening of the anal fistula may be seen during a physical examination. Other examinations that may be performed include:   Examination of the rectal area with a gloved hand (digital rectal exam).   Examination with a probe or scope to help locate the internal opening of the fistula.   Injection of a dye into the fistula opening. X-rays can be taken to find the exact location and path of the fistula.   An MRI or ultrasound of the anal area.  Other tests may be performed to find the cause of the anal fistula.   TREATMENT  The most  common treatment for an anal fistula is surgery. There are different surgery options depending on where your fistula is located and how complex the fistula is. Surgical options include:  A fistulotomy. This surgery involves opening up the whole fistula and draining the contents inside to promote healing.  Seton placement. A silk string (seton) is placed into the fistula during a fistulotomy to drain any infection to promote healing.  Advancement flap procedure. Tissue is removed from your rectum or the skin around the anus and is attached to the opening of the fistula.  Bioprosthetic plug. A cone-shaped plug is made from your tissue and is used to block the opening of the fistula. Some anal fistulas do not require surgery. A fibrin glue is a non-surgical option that involves injecting the glue to seal the fistula. You also may be prescribed an antibiotic medicine to treat an infection.  HOME CARE INSTRUCTIONS   Take your antibiotics as directed. Finish them even if you start to feel better.  Only take over-the-counter or prescription medicines as directed by your caregiver.Use a stool softener or laxative, if recommended.   Eat a high-fiber diet to help avoid constipation or as directed by your caregiver.  Drink enough water to keep your urine clear or pale yellow.   A warm sitz bath may be soothing and help with healing. You may take warm sitz baths for 15 20 minutes, 3 4 times a day to ease pain and discomfort.  Follow excellent hygiene to keep the anal area as clean and dry as possible. Use wet toilet paper or moist towelettes after each bowel movement.  SEEK MEDICAL CARE IF: You have increased pain not controlled with medicines.  SEEK IMMEDIATE MEDICAL CARE IF:  You have severe, intolerable pain.  You have new swelling, redness, or discharge around the anal area.  You have tenderness or warmth around the anal area.  You have chills or diarrhea.  You have severe problems  urinating or having a bowel movement.   You have a fever or persistent symptoms for more than 2 3 days.   You have a fever and your symptoms suddenly get worse.  MAKE SURE YOU:   Understand these instructions.  Will watch your condition.  Will get help right away if you are not doing well or get worse. Document Released: 02/24/2008 Document Revised: 02/28/2012 Document Reviewed: 01/16/2011 [facility] Patient Information 2014 [place], [place].   HEMORRHOIDS  The rectum is the last foot of your colon, and it naturally stretches to hold stool.  Hemorrhoidal piles are natural clusters of blood vessels that help the rectum and anal canal stretch to hold stool and allow bowel movements to eliminate feces.   Hemorrhoids are abnormally swollen blood vessels in the rectum.  Too much pressure in the rectum causes hemorrhoids by forcing blood to stretch and bulge the walls of the veins, sometimes even rupturing them.  Hemorrhoids can become like varicose veins you might see on a person's legs.  Most people will develop a flare of hemorrhoids in their lifetime.  When bulging hemorrhoidal veins are irritated, they can swell, burn, itch, cause pain, and bleed.  Most flares will calm down gradually own within a few weeks.  However, once hemorrhoids are created, they are difficult to get rid of completely and tend to flare more easily than the first flare.   Fortunately, good habits and simple medical treatment usually control hemorrhoids well, and surgery is needed only in severe cases. Types of Hemorrhoids:  Internal hemorrhoids usually don't initially hurt or itch; they are deep inside the rectum and usually have no sensation. If they begin to push out (prolapse), pain and burning can occur.  However, internal hemorrhoids can bleed.  Anal bleeding should not be ignored since bleeding could come from a dangerous source like colorectal cancer, so persistent rectal bleeding should be investigated by a doctor,  sometimes with a colonoscopy.  External hemorrhoids cause most of the symptoms - pain, burning, and itching. Nonirritated hemorrhoids can look like small skin tags coming out of the anus.   Thrombosed hemorrhoids can form when a hemorrhoid blood vessel bursts and causes the hemorrhoid to suddenly swell.  A purple blood clot can form in it and become an excruciatingly painful lump at the anus. Because of these unpleasant symptoms, immediate incision and drainage by a surgeon at an office visit can provide much relief of the pain.    PREVENTION Avoiding the most frequent causes listed below will prevent most cases of hemorrhoids: Constipation Hard stools Diarrhea  Constant sitting  Straining with bowel movements Sitting on the toilet for a long time  Severe coughing  episodes Pregnancy / Childbirth  Heavy Lifting  Sometimes avoiding the above triggers is difficult:  How can you avoid sitting all day if you have a seated job? Also, we try to avoid coughing and diarrhea, but sometimes it's beyond your control.  Still, there are some practical hints to help: Keep the anal and  genital area clean.  Moistened tissues such as flushable wet wipes are less irritating than toilet paper.  Using irrigating showers or bottle irrigation washing gently cleans this sensitive area.   Avoid dry toilet paper when cleaning after bowel movements.  [name]. Keep the anal and genital area dry.  Lightly pat the rectal area dry.  Avoid rubbing.  Talcum or baby powders can help GET YOUR STOOLS SOFT.   This is the most important way to prevent irritated hemorrhoids.  Hard stools are like sandpaper to the anorectal canal and will cause more problems.  The goal: ONE SOFT BOWEL MOVEMENT A DAY!  BMs from every other day to 3 times a day is a tolerable range Treat coughing, diarrhea and constipation early since irritated hemorrhoids may soon follow.  If your main job activity is seated, always stand or walk during your breaks. Make it a  point to stand and walk at least 5 minutes every hour and try to shift frequently in your chair to avoid direct rectal pressure.  Always exhale as you strain or lift. Don't hold your breath.  Do not delay or try to prevent a bowel movement when the urge is present. Exercise regularly (walking or jogging 60 minutes a day) to stimulate the bowels to move. No reading or other activity while on the toilet. If bowel movements take longer than 5 minutes, you are too constipated. AVOID CONSTIPATION Drink plenty of liquids (1 1/2 to 2 quarts of water and other fluids a day unless fluid restricted for another medical condition). Liquids that contain caffeine (coffee a, tea, soft drinks) can be dehydrating and should be avoided until constipation is controlled. Consider minimizing milk, as dairy products may be constipating. Eat plenty of fiber (30g a day ideal, more if needed).  Fiber is the undigested part of plant food that passes into the colon, acting as "natures broom" to encourage bowel motility and movement.  Fiber can absorb and hold large amounts of water. This results in a larger, bulkier stool, which is soft and easier to pass.  Eating foods high in fiber - 12 servings - such as  Vegetables: Root (potatoes, carrots, turnips), Leafy green (lettuce, salad greens, celery, spinach), High residue (cabbage, broccoli, etc.) Fruit: Fresh, Dried (prunes, apricots, cherries), Stewed (applesauce)  Whole grain breads, pasta, whole wheat Bran cereals, muffins, etc. Consider adding supplemental bulking fiber which retains large volumes of water: Psyllium ground seeds (native plant from [place])--available as Metamucil, Konsyl, Effersyllium, Per Diem Fiber, or the less expensive generic forms.  Citrucel  (methylcellulose wood fiber) . FiberCon (Polycarbophil) Polyethylene Glycol - and "artificial" fiber commonly called Miralax or Glycolax.  It is helpful for people with gassy or bloated feelings with  regular fiber Flax Seed - a less gassy natural fiber  Laxatives can be useful for a short period if constipation is severe Osmotics (Milk of Magnesia, Fleets Phospho-Soda, Magnesium Citrate)  Stimulants (Senokot,   Castor Oil,  Dulcolax, Ex-Lax)    Laxatives are not a good long-term solution as it can stress the bowels and cause too much mineral loss and dehydration.   Avoid taking laxatives for more than 7 days in a row.  AVOID DIARRHEA Switch to liquids and simpler foods for a few days to avoid stressing your intestines further. Avoid dairy products (especially milk & ice cream) for a short time.  The intestines often can lose the ability to digest lactose when stressed. Avoid foods that cause gassiness or bloating.  Typical foods  include beans and other legumes, cabbage, broccoli, and dairy foods.  Every person has some sensitivity to other foods, so listen to your body and avoid those foods that trigger problems for you. Adding fiber (Citrucel, Metamucil, FiberCon, Flax seed, Miralax) gradually can help thicken stools by absorbing excess fluid and retrain the intestines to act more normally.  Slowly increase the dose over a few weeks.  Too much fiber too soon can backfire and cause cramping & bloating. Probiotics (such as active yogurt, Align, etc) may help repopulate the intestines and colon with normal bacteria and calm down a sensitive digestive tract.  Most studies show it to be of mild help, though, and such products can be costly. Medicines: Bismuth subsalicylate (ex. Kayopectate, Pepto Bismol) every 30 minutes for up to 6 doses can help control diarrhea.  Avoid if pregnant. Loperamide (Immodium) can slow down diarrhea.  Start with two tablets (4mg  total) first and then try one tablet every 6 hours.  Avoid if you are having fevers or severe pain.  If you are not better or start feeling worse, stop all medicines and call your doctor for advice Call your doctor if you are getting worse or not  better.  Sometimes further testing (cultures, endoscopy, X-ray studies, bloodwork, etc) may be needed to help diagnose and treat the cause of the diarrhea. TREATMENT OF HEMORRHOID FLARE If these preventive measures fail, you must take action right away! Hemorrhoids are one condition that can be mild in the morning and become intolerable by nightfall. Most hemorrhoidal flares take several weeks to calm down.  These suggestions can help: Warm soaks.  This helps more than any topical medication.  Use up to 8 times a day.  Usually sitz baths or sitting in a warm bathtub helps.  Sitting on moist warm towels are helpful.  Switching to ice packs/cool compresses can be helpful  Use a Sitz Bath 4-8 times a day for relief A sitz bath is a warm water bath taken in the sitting position that covers only the hips and buttocks. It may be used for either healing or hygiene purposes. Sitz baths are also used to relieve pain, itching, or muscle spasms. The water may contain medicine. Moist heat will help you heal and relax.  HOME CARE INSTRUCTIONS  Take 3 to 4 sitz baths a day. 1. Fill the bathtub half full with warm water. 2. Sit in the water and open the drain a little. 3. Turn on the warm water to keep the tub half full. Keep the water running constantly. 4. Soak in the water for 15 to 20 minutes. 5. After the sitz bath, pat the affected area dry first. SEEK MEDICAL CARE IF:  You get worse instead of better. Stop the sitz baths if you get worse.  Normalize your bowels.  Extremes of diarrhea or constipation will make hemorrhoids worse.  One soft bowel movement a day is the goal.  Fiber can help get your bowels regular Wet wipes instead of toilet paper Pain control with a NSAID such as ibuprofen (Advil) or naproxen (Aleve) or acetaminophen (Tylenol) around the clock.  Narcotics are constipating and should be minimized if possible Topical creams contain steroids (bydrocortisone) or local anesthetic (xylocaine)  can help make pain and itching more tolerable.   EVALUATION If hemorrhoids are still causing problems, you could benefit by an evaluation by a surgeon.  The surgeon will obtain a history and examine you.  If hemorrhoids are diagnosed, some therapies can be offered in  the office, usually with an anoscope into the less sensitive area of the rectum: -injection of hemorrhoids (sclerotherapy) can scar the blood vessels of the swollen/enlarged hemorrhoids to help shrink them down to a more normal size -rubber banding of the enlarged hemorrhoids to help shrink them down to a more normal size -drainage of the blood clot causing a thrombosed hemorrhoid,  to relieve the severe pain   While 90% of the time such problems from hemorrhoids can be managed without preceding to surgery, sometimes the hemorrhoids require a operation to control the problem (uncontrolled bleeding, prolapse, pain, etc.).   This involves being placed under general anesthesia where the surgeon can confirm the diagnosis and remove, suture, or staple the hemorrhoid(s).  Your surgeon can help you treat the problem appropriately.

## 2013-06-05 ENCOUNTER — Ambulatory Visit (INDEPENDENT_AMBULATORY_CARE_PROVIDER_SITE_OTHER): Payer: BC Managed Care – PPO | Admitting: Internal Medicine

## 2013-06-05 ENCOUNTER — Encounter: Payer: Self-pay | Admitting: Internal Medicine

## 2013-06-05 VITALS — BP 152/82 | HR 68 | Ht 74.0 in | Wt 242.0 lb

## 2013-06-05 DIAGNOSIS — I10 Essential (primary) hypertension: Secondary | ICD-10-CM

## 2013-06-05 DIAGNOSIS — I493 Ventricular premature depolarization: Secondary | ICD-10-CM

## 2013-06-05 DIAGNOSIS — I4949 Other premature depolarization: Secondary | ICD-10-CM

## 2013-06-05 DIAGNOSIS — I4891 Unspecified atrial fibrillation: Secondary | ICD-10-CM

## 2013-06-05 DIAGNOSIS — F1010 Alcohol abuse, uncomplicated: Secondary | ICD-10-CM

## 2013-06-05 NOTE — Progress Notes (Signed)
PCP:  Carolyne FiscalBLOMGREN,PETER F, MD  The patient presents today for routine electrophysiology followup.  Donald Grant has done very well since his last visit to our clinic.  Donald Grant continues to exercise without significant limitation.  Donald Grant denies symptoms of afib or ventricular arrhythmia.  Donald Grant has had difficulty with gout.  Unfortunately Donald Grant continues to drink ETOH heavily.  Today, Donald Grant denies symptoms of chest pain, shortness of breath, orthopnea, PND, lower extremity edema, dizziness, presyncope, syncope, or neurologic sequela.  The patient feels that Donald Grant is tolerating medications without difficulties and is otherwise without complaint today.   Past Medical History  Diagnosis Date  . Recurrent upper respiratory infection (URI)   . GERD (gastroesophageal reflux disease)   . Dysrhythmia     atrial flutter  . Dyslipidemia   . Atrial flutter     CTI ablation by Dr Ladona Ridgelaylor 02/24/11  . Atrial tachycardia     mapped to AV node by Dr Ladona Ridgelaylor, ablation not performed  . Atrial fibrillation   . Premature ventricular contraction   . First degree AV block   . Gout     bilateral feet  . Lymphadenopathy of right cervical region ?Mono 04/16/2013   Past Surgical History  Procedure Laterality Date  . Root canal    . Mouth surgery    . Atrial ablation surgery  02/24/11    CTI ablation by Dr Ladona Ridgelaylor  . Cardioversion (bedside)  03/29/2011       . Cardioversion  03/29/2011    Procedure: CARDIOVERSION;  Surgeon: Lewayne BuntingBrian S Crenshaw, MD;  [facility];  Service: Cardiovascular;  Laterality: N/A;  . Examination under anesthesia  12/19/12    Anal Fistula    Current Outpatient Prescriptions  Medication Sig Dispense Refill  . acyclovir (ZOVIRAX) 400 MG tablet 400 mg as needed.       Donald Grant. allopurinol (ZYLOPRIM) 300 MG tablet Take 300 mg by mouth daily.      Donald Grant. aspirin 325 MG tablet Take 325 mg by mouth daily.      Donald Grant. atorvastatin (LIPITOR) 40 MG tablet Take 20 mg by mouth daily.       Donald Grant. b complex vitamins capsule Take 1 capsule by mouth  daily.       . colchicine 0.6 MG tablet Take 0.6 mg by mouth 2 (two) times daily.      . fish oil-omega-3 fatty acids 1000 MG capsule Take 2 g by mouth daily.        . Multiple Vitamin (MULITIVITAMIN WITH MINERALS) TABS Take 1 tablet by mouth daily.         No current facility-administered medications for this visit.    No Known Allergies  History   Social History  . Marital Status: Married    Spouse Name: N/A    Number of Children: N/A  . Years of Education: N/A   Occupational History  . Not on file.   Social History Main Topics  . Smoking status: Former Smoker    Quit date: 08/28/2010  . Smokeless tobacco: Not on file  . Alcohol Use: Yes     Comment: drinks beer 3 to 4 times a week  . Drug Use: Yes    Special: Marijuana     Comment: still currently smoking  . Sexual Activity: Yes   Other Topics Concern  . Not on file   Social History Narrative   His additional social history is notable that the patient exercises     several times a week, jogging up  to 2 miles a day.  With exercise, Donald Grant     notes that Donald Grant has lost over 10 pounds.     FH- sister has first degree AV block and PVCs,  The patient's mother mother died suddenly in her 30s.  She apparently had a dilated cardiomyopathy (per pt) and did not have an autopsy obtained.  A maternal aunt died suddenly but per pt had a h/o polysubstance abuse which was felt to be the cause.  She did not have an autopsy.  The patient's maternal grandmother died suddenly  In her 30s and no autopsy was performed per pt.  Physical Exam: Filed Vitals:   06/05/13 1549  BP: 152/82  Pulse: 68  Height: 6\' 2"  (1.88 m)  Weight: 242 lb (109.77 kg)    GEN- The patient is well appearing, alert and oriented x 3 today.   Head- normocephalic, atraumatic Eyes-  Sclera clear, conjunctiva pink Ears- hearing intact Oropharynx- clear Neck- supple, no JVP Lymph- no cervical lymphadenopathy Lungs- Clear to ausculation bilaterally, normal work of  breathing Heart- irregular rate and rhythm, no murmurs, rubs or gallops, PMI not laterally displaced GI- soft, NT, ND, + BS Extremities- no clubbing, cyanosis, or edema  ekg today reveals afib with PVCs, V raqte 68 bpm, septal infarct, nonspecific ST/ T changes   Cardiac MRI 08/09/11: 1. Normal LV size and systolic function, EF estimated at 55% (unable to calculate by software because of frequent ectopy).  2. No evidence for ARVC. The RV appears normal in size, morphology, and function.  3. No definite myocardial delayed enhancement (though delayed enhancement images were not of high quality on this study).   GXT 07/19/11- no exercise induced arrhythmias  Assessment and Plan:  1. Paroxysmal atrial fibrillation asymptomatic chads2vasc score is 1.  Continue ASA Would avoid AAD therapy at this time Repeat echo ordered Donald Grant will continue to work on ETOh cessation which is likely his main trigger for afib.  2. htn bp is elevated today.  Repeat BP is 138/82 Consider addition of an ARB upon return (these have been shown to reduce atrial scarring related to afib) Given bradycardia/ long first degree AV block I would avoid chronotropic agents   3. PVCs Stable Obtain bmet and mg today Repeat echo  4. Heavy ETOH use Cessation is strongly advised  Return in 12 months Donald Grant will contact me if problems arise in the interim

## 2013-06-05 NOTE — Patient Instructions (Signed)
Your physician wants you to follow-up in: 12 months with Dr Jacquiline Doe will receive a reminder letter in the mail two months in advance. If you don't receive a letter, please call our office to schedule the follow-up appointment.  Your physician has requested that you have an echocardiogram. Echocardiography is a painless test that uses sound waves to create images of your heart. It provides your doctor with information about the size and shape of your heart and how well your heart's chambers and valves are working. This procedure takes approximately one hour. There are no restrictions for this procedure.  Your physician recommends that you return for lab work today: BMP/MAG

## 2013-06-06 LAB — BASIC METABOLIC PANEL
BUN: 15 mg/dL (ref 6–23)
CHLORIDE: 106 meq/L (ref 96–112)
CO2: 27 mEq/L (ref 19–32)
Calcium: 9.3 mg/dL (ref 8.4–10.5)
Creatinine, Ser: 0.9 mg/dL (ref 0.4–1.5)
GFR: 104.06 mL/min (ref >60.00)
Glucose, Bld: 92 mg/dL (ref 70–99)
Potassium: 4.3 mEq/L (ref 3.5–5.1)
Sodium: 140 mEq/L (ref 135–145)

## 2013-06-06 LAB — MAGNESIUM: MAGNESIUM: 1.8 mg/dL (ref 1.5–2.5)

## 2013-06-16 ENCOUNTER — Encounter (HOSPITAL_COMMUNITY): Payer: Self-pay | Admitting: Emergency Medicine

## 2013-06-16 ENCOUNTER — Emergency Department (HOSPITAL_COMMUNITY): Payer: BC Managed Care – PPO

## 2013-06-16 ENCOUNTER — Emergency Department (HOSPITAL_COMMUNITY)
Admission: EM | Admit: 2013-06-16 | Discharge: 2013-06-16 | Disposition: A | Discharge status: Home / Self Care | Payer: BC Managed Care – PPO | Attending: Emergency Medicine | Admitting: Emergency Medicine

## 2013-06-16 DIAGNOSIS — Z8679 Personal history of other diseases of the circulatory system: Secondary | ICD-10-CM | POA: Insufficient documentation

## 2013-06-16 DIAGNOSIS — R1013 Epigastric pain: Secondary | ICD-10-CM | POA: Insufficient documentation

## 2013-06-16 DIAGNOSIS — E785 Hyperlipidemia, unspecified: Secondary | ICD-10-CM | POA: Insufficient documentation

## 2013-06-16 DIAGNOSIS — Z7982 Long term (current) use of aspirin: Secondary | ICD-10-CM | POA: Insufficient documentation

## 2013-06-16 DIAGNOSIS — Z79899 Other long term (current) drug therapy: Secondary | ICD-10-CM | POA: Insufficient documentation

## 2013-06-16 DIAGNOSIS — Z8709 Personal history of other diseases of the respiratory system: Secondary | ICD-10-CM | POA: Insufficient documentation

## 2013-06-16 DIAGNOSIS — R079 Chest pain, unspecified: Secondary | ICD-10-CM

## 2013-06-16 DIAGNOSIS — Z87891 Personal history of nicotine dependence: Secondary | ICD-10-CM | POA: Insufficient documentation

## 2013-06-16 DIAGNOSIS — R0602 Shortness of breath: Secondary | ICD-10-CM | POA: Insufficient documentation

## 2013-06-16 DIAGNOSIS — Z9889 Other specified postprocedural states: Secondary | ICD-10-CM | POA: Insufficient documentation

## 2013-06-16 DIAGNOSIS — M109 Gout, unspecified: Secondary | ICD-10-CM | POA: Insufficient documentation

## 2013-06-16 DIAGNOSIS — Z8719 Personal history of other diseases of the digestive system: Secondary | ICD-10-CM | POA: Insufficient documentation

## 2013-06-16 LAB — COMPREHENSIVE METABOLIC PANEL
ALT: 34 U/L (ref 0–53)
AST: 35 U/L (ref 0–37)
Albumin: 4.3 g/dL (ref 3.5–5.2)
Alkaline Phosphatase: 59 U/L (ref 39–117)
BUN: 12 mg/dL (ref 6–23)
CO2: 24 mEq/L (ref 19–32)
Calcium: 9.4 mg/dL (ref 8.4–10.5)
Chloride: 101 mEq/L (ref 96–112)
Creatinine, Ser: 0.71 mg/dL (ref 0.50–1.35)
GFR calc non Af Amer: >90 mL/min (ref >90)
GLUCOSE: 103 mg/dL — AB (ref 70–99)
Potassium: 4 mEq/L (ref 3.7–5.3)
Sodium: 140 mEq/L (ref 137–147)
Total Bilirubin: 1.4 mg/dL — ABNORMAL HIGH (ref 0.3–1.2)
Total Protein: 7.2 g/dL (ref 6.0–8.3)

## 2013-06-16 LAB — CBC
HCT: 43.5 % (ref 39.0–52.0)
HEMOGLOBIN: 16 g/dL (ref 13.0–17.0)
MCH: 31.5 pg (ref 26.0–34.0)
MCHC: 36.8 g/dL — AB (ref 30.0–36.0)
MCV: 85.6 fL (ref 78.0–100.0)
Platelets: 177 10*3/uL (ref 150–400)
RBC: 5.08 MIL/uL (ref 4.22–5.81)
RDW: 13.3 % (ref 11.5–15.5)
WBC: 6.7 10*3/uL (ref 4.0–10.5)

## 2013-06-16 LAB — I-STAT TROPONIN, ED: TROPONIN I, POC: 0.03 ng/mL (ref 0.00–0.08)

## 2013-06-16 LAB — LIPASE, BLOOD: LIPASE: 20 U/L (ref 11–59)

## 2013-06-16 MED ORDER — NITROGLYCERIN 0.4 MG SL SUBL: 0.4000 mg | SUBLINGUAL_TABLET | SUBLINGUAL | Status: DC | PRN

## 2013-06-16 NOTE — Discharge Instructions (Signed)
Chest Pain (Nonspecific) °It is often hard to give a specific diagnosis for the cause of chest pain. There is always a chance that your pain could be related to something serious, such as a heart attack or a blood clot in the lungs. You need to follow up with your caregiver for further evaluation. °CAUSES  °· Heartburn. °· Pneumonia or bronchitis. °· Anxiety or stress. °· Inflammation around your heart (pericarditis) or lung (pleuritis or pleurisy). °· A blood clot in the lung. °· A collapsed lung (pneumothorax). It can develop suddenly on its own (spontaneous pneumothorax) or from injury (trauma) to the chest. °· Shingles infection (herpes zoster virus). °The chest wall is composed of bones, muscles, and cartilage. Any of these can be the source of the pain. °· The bones can be bruised by injury. °· The muscles or cartilage can be strained by coughing or overwork. °· The cartilage can be affected by inflammation and become sore (costochondritis). °DIAGNOSIS  °Lab tests or other studies, such as X-rays, electrocardiography, stress testing, or cardiac imaging, may be needed to find the cause of your pain.  °TREATMENT  °· Treatment depends on what may be causing your chest pain. Treatment may include: °· Acid blockers for heartburn. °· Anti-inflammatory medicine. °· Pain medicine for inflammatory conditions. °· Antibiotics if an infection is present. °· You may be advised to change lifestyle habits. This includes stopping smoking and avoiding alcohol, caffeine, and chocolate. °· You may be advised to keep your head raised (elevated) when sleeping. This reduces the chance of acid going backward from your stomach into your esophagus. °· Most of the time, nonspecific chest pain will improve within 2 to 3 days with rest and mild pain medicine. °HOME CARE INSTRUCTIONS  °· If antibiotics were prescribed, take your antibiotics as directed. Finish them even if you start to feel better. °· For the next few days, avoid physical  activities that bring on chest pain. Continue physical activities as directed. °· Do not smoke. °· Avoid drinking alcohol. °· Only take over-the-counter or prescription medicine for pain, discomfort, or fever as directed by your caregiver. °· Follow your caregiver's suggestions for further testing if your chest pain does not go away. °· Keep any follow-up appointments you made. If you do not go to an appointment, you could develop lasting (chronic) problems with pain. If there is any problem keeping an appointment, you must call to reschedule. °SEEK MEDICAL CARE IF:  °· You think you are having problems from the medicine you are taking. Read your medicine instructions carefully. °· Your chest pain does not go away, even after treatment. °· You develop a rash with blisters on your chest. °SEEK IMMEDIATE MEDICAL CARE IF:  °· You have increased chest pain or pain that spreads to your arm, neck, jaw, back, or abdomen. °· You develop shortness of breath, an increasing cough, or you are coughing up blood. °· You have severe back or abdominal pain, feel nauseous, or vomit. °· You develop severe weakness, fainting, or chills. °· You have a fever. °THIS IS AN EMERGENCY. Do not wait to see if the pain will go away. Get medical help at once. Call your local emergency services (911 in [place] Do not drive yourself to the hospital. °MAKE SURE YOU:  °· Understand these instructions. °· Will watch your condition. °· Will get help right away if you are not doing well or get worse. °Document Released: 12/21/2004 Document Revised: 06/05/2011 Document Reviewed: 10/17/2007 °Exit[employer]® Patient Information ©2014 [employer]   [employer]. ° °

## 2013-06-16 NOTE — ED Provider Notes (Signed)
CSN: [id number]     Arrival date & time 06/16/13  0848 History   First MD Initiated Contact with Patient 06/16/13 [phone]     Chief Complaint  Patient presents with  . Chest Pain     (Consider location/radiation/quality/duration/timing/severity/associated sxs/prior Treatment) HPI Comments: 38 year old male presents with 2 days of chest "fullness". States is in his epigastrium/lower chest. The pain has not gotten worse. He states it is mild maybe a 3/10. When he is at rest and not doing anything he seems to notice it more. When he is active he does not notice it. He states started was immediately after eating a [employer] sandwich. He denies any nausea, vomiting, or diarrhea. He states last night as he was short of breath but is not sure of this is anxiety related. He used to have a lot of anxiety issues but hasn't slowed down. He currently has a cardiologist, Dr. Johney Frame, who follows him for his atrial fibrillation after multiple ablations. Denies any pleuritic pain, leg swelling or back pain.   Past Medical History  Diagnosis Date  . Recurrent upper respiratory infection (URI)   . GERD (gastroesophageal reflux disease)   . Dysrhythmia     atrial flutter  . Dyslipidemia   . Atrial flutter     CTI ablation by Dr [name] 02/24/11  . Atrial tachycardia     mapped to AV node by Dr [name], ablation not performed  . Atrial fibrillation   . Premature ventricular contraction   . First degree AV block   . Gout     bilateral feet  . Lymphadenopathy of right cervical region ?Mono 04/16/2013   Past Surgical History  Procedure Laterality Date  . Root canal    . Mouth surgery    . Atrial ablation surgery  02/24/11    CTI ablation by Dr [name]  . Cardioversion (bedside)  03/29/2011       . Cardioversion  03/29/2011    Procedure: CARDIOVERSION;  Surgeon: [name], MD;  Location: [facility] OR;  Service: Cardiovascular;  Laterality: N/A;  . Examination under anesthesia  12/19/12    Anal Fistula   Family  History  Problem Relation Age of Onset  . Heart disease Mother   . Achondroplasia Mother   . Alcohol abuse Mother   . Achondroplasia Son    History  Substance Use Topics  . Smoking status: Former Smoker    Quit date: 08/28/2010  . Smokeless tobacco: Not on file  . Alcohol Use: Yes     Comment: drinks beer 3 to 4 times a week    Review of Systems  Constitutional: Negative for fever.  Respiratory: Positive for shortness of breath. Negative for cough.   Cardiovascular: Positive for chest pain.  Gastrointestinal: Positive for abdominal pain. Negative for nausea, vomiting and diarrhea.  Musculoskeletal: Negative for back pain.  All other systems reviewed and are negative.      Allergies  Review of patient's allergies indicates no known allergies.  Home Medications   Current Outpatient Rx  Name  Route  Sig  Dispense  Refill  . acyclovir (ZOVIRAX) 400 MG tablet   Oral   Take 400 mg by mouth 2 (two) times daily.          [name] allopurinol (ZYLOPRIM) 300 MG tablet   Oral   Take 300 mg by mouth daily.         [name] aspirin 325 MG tablet   Oral   Take 325 mg by mouth daily.         [name]  atorvastatin (LIPITOR) 40 MG tablet   Oral   Take 20 mg by mouth daily.          [name] b complex vitamins capsule   Oral   Take 1 capsule by mouth daily.          . cholecalciferol (VITAMIN D) 1000 UNITS tablet   Oral   Take 1,000 Units by mouth 2 (two) times daily.         . colchicine 0.6 MG tablet   Oral   Take 0.6 mg by mouth 2 (two) times daily.         . fish oil-omega-3 fatty acids 1000 MG capsule   Oral   Take 2 g by mouth daily.           . Multiple Vitamin (MULITIVITAMIN WITH MINERALS) TABS   Oral   Take 1 tablet by mouth daily.            BP 143/85  Pulse 72  Temp(Src) 97.6 F (36.4 C) (Oral)  Resp 26  Ht 6\' 2"  (1.88 m)  Wt 240 lb (108.863 kg)  BMI 30.80 kg/m2  SpO2 99% Physical Exam  Nursing note and vitals reviewed. Constitutional: He is oriented  to person, place, and time. He appears well-developed and well-nourished. No distress.  HENT:  Head: Normocephalic and atraumatic.  Right Ear: External ear normal.  Left Ear: External ear normal.  Nose: Nose normal.  Eyes: Right eye exhibits no discharge. Left eye exhibits no discharge.  Neck: Neck supple.  Cardiovascular: Normal rate, regular rhythm, normal heart sounds and intact distal pulses.   Pulmonary/Chest: Effort normal and breath sounds normal. Not tachypneic. He has no wheezes.  Abdominal: Soft. There is tenderness (mild) in the epigastric area.  Musculoskeletal: He exhibits no edema.  Neurological: He is alert and oriented to person, place, and time.  Skin: Skin is warm and dry.    ED Course  Procedures (including critical care time) Labs Review Labs Reviewed  CBC - Abnormal; Notable for the following:    MCHC 36.8 (*)    All other components within normal limits  COMPREHENSIVE METABOLIC PANEL - Abnormal; Notable for the following:    Glucose, Bld 103 (*)    Total Bilirubin 1.4 (*)    All other components within normal limits  LIPASE, BLOOD  I-STAT TROPOININ, ED   Imaging Review Dg Chest 2 View  06/16/2013   CLINICAL DATA:  Chest pain, shortness of breath.  EXAM: CHEST  2 VIEW  COMPARISON:  None.  FINDINGS: Heart and mediastinal contours are within normal limits. No focal opacities or effusions. No acute bony abnormality.  IMPRESSION: No active cardiopulmonary disease.   Electronically Signed   By: Charlett Nose M.D.   On: 06/16/2013 10:46     EKG Interpretation   Date/Time:  Monday June 16 2013 08:52:55 EDT Ventricular Rate:  67 PR Interval:    QRS Duration: 102 QT Interval:  424 QTC Calculation: 448 R Axis:   -20 Text Interpretation:  Atrial flutter Anteroseptal infarct , age  undetermined Abnormal ECG frequent PVCs Confirmed by Arjan Strohm  MD, Lisa Milian  ([id number]) on 06/16/2013 8:56:35 AM      MDM   Final diagnoses:  Chest pain    Patient's symptoms  resolved spontaneously. He is low risk for CP, and has atypical story. His exam appears more GI in origin with his history and mild epigastric discomfort. His EKG shows atrial flutter which is not new, but no ischemia. His  symptoms have been constant for over 48 hours, so with negative troponin I feel he is ruled out for MI. He has a cardiologist, as he is low risk I feel he can f/u closely with Dr. Johney FrameAllred for outpatient workup.     Audree CamelScott T Macyn Shropshire, MD 06/16/13 [phone]

## 2013-06-16 NOTE — ED Notes (Addendum)
Pt c/o chest pain, described as dull and tight in center of chest and feeling full.  Reports SOB all night.  States it feels like indigestion.  Pain started Saturday 2pm after eating.  Pt attributed discomfort to indigestion but after SOB became more worried.  Hx atrial flutter, txed with cardioversion (in cath lab) about 3 years ago, afterwards afib and pvcs.  Pt on aspirin 325mg  every morning.  Denies n/v, diaphoresis, dizziness.  Reports some lightheadedness.  No SOB currently.  No apparent distress.  Respirations equal and unlabored.  Skin warm and dry.

## 2013-06-16 NOTE — ED Notes (Signed)
Pt c/o center chest pain onset Saturday after eating at [employer]. Pt describes pain as indigestion feeling. Pt reports Shortness of breath onset last night while trying to sleep.

## 2013-06-23 ENCOUNTER — Ambulatory Visit (HOSPITAL_COMMUNITY): Payer: BC Managed Care – PPO | Attending: Internal Medicine | Admitting: Cardiology

## 2013-06-23 DIAGNOSIS — I493 Ventricular premature depolarization: Secondary | ICD-10-CM

## 2013-06-23 DIAGNOSIS — I4949 Other premature depolarization: Secondary | ICD-10-CM | POA: Insufficient documentation

## 2013-06-23 DIAGNOSIS — I4891 Unspecified atrial fibrillation: Secondary | ICD-10-CM | POA: Insufficient documentation

## 2013-06-23 NOTE — Progress Notes (Signed)
Echo performed. 

## 2013-07-29 ENCOUNTER — Ambulatory Visit (INDEPENDENT_AMBULATORY_CARE_PROVIDER_SITE_OTHER): Payer: BC Managed Care – PPO | Admitting: Physician Assistant

## 2013-07-29 ENCOUNTER — Telehealth: Payer: Self-pay | Admitting: Physician Assistant

## 2013-07-29 ENCOUNTER — Encounter: Payer: Self-pay | Admitting: Physician Assistant

## 2013-07-29 VITALS — BP 130/78 | HR 63 | Ht 74.0 in | Wt 235.0 lb

## 2013-07-29 DIAGNOSIS — I428 Other cardiomyopathies: Secondary | ICD-10-CM

## 2013-07-29 DIAGNOSIS — F1010 Alcohol abuse, uncomplicated: Secondary | ICD-10-CM

## 2013-07-29 DIAGNOSIS — I429 Cardiomyopathy, unspecified: Secondary | ICD-10-CM | POA: Insufficient documentation

## 2013-07-29 DIAGNOSIS — I423 Endomyocardial (eosinophilic) disease: Secondary | ICD-10-CM | POA: Insufficient documentation

## 2013-07-29 DIAGNOSIS — I4891 Unspecified atrial fibrillation: Secondary | ICD-10-CM

## 2013-07-29 DIAGNOSIS — I4949 Other premature depolarization: Secondary | ICD-10-CM

## 2013-07-29 DIAGNOSIS — I10 Essential (primary) hypertension: Secondary | ICD-10-CM

## 2013-07-29 DIAGNOSIS — I493 Ventricular premature depolarization: Secondary | ICD-10-CM

## 2013-07-29 MED ORDER — DABIGATRAN ETEXILATE MESYLATE 150 MG PO CAPS: 150.0000 mg | ORAL_CAPSULE | Freq: Two times a day (BID) | ORAL | Status: DC

## 2013-07-29 MED ORDER — LOSARTAN POTASSIUM 25 MG PO TABS: 25.0000 mg | ORAL_TABLET | Freq: Every day | ORAL | Status: DC

## 2013-07-29 NOTE — Telephone Encounter (Signed)
Called pt and transferred call to Tereso Newcomer, [facility].

## 2013-07-29 NOTE — Patient Instructions (Signed)
START LOSARTAN 25 MG DAILY; RX SENT IN TODAY  LAB WORK IN 1-2 WEEKS (BMET)  Your physician has requested that you have en exercise stress myoview DX CARDIOMYOPATHY, A-FIB, CHEST PAIN. For further information please visit [email or web address] Please follow instruction sheet, as given.  Your physician has recommended that you wear a 24 HOUR holter monitor. Holter monitors are medical devices that record the heart's electrical activity. Doctors most often use these monitors to diagnose arrhythmias. Arrhythmias are problems with the speed or rhythm of the heartbeat. The monitor is a small, portable device. You can wear one while you do your normal daily activities. This is usually used to diagnose what is causing palpitations/syncope (passing out).  YOU WILL NEED A FOLLOW UP IN 1 MONTH WITH DR. ALLRED PER SCOTT WEAVER, PAC

## 2013-07-29 NOTE — Progress Notes (Addendum)
I reviewed with Dr. [name]. His CHADS2-VASc=2 (HTN, cardiomyopathy). Therefore, we will start anticoagulation. I spoke with the patient and I will start him on Pradaxa 150 mg BID. D/c ASA. Signed,  Tereso Newcomer, PA-C   07/29/2013 1:11 PM

## 2013-07-29 NOTE — Addendum Note (Signed)
Addended byAlben Spittle, Lorin Picket T on: 07/29/2013 02:01 PM   Modules accepted: Orders, Medications

## 2013-07-29 NOTE — Progress Notes (Signed)
[address] [place], [place]  [address] Phone: [phone] Fax:  [phone]  Date:  07/29/2013   ID:  Donald Grant 1975/11/12, MRN [id number]  PCP:  [name], MD  Cardiologist:  Dr. [name]    History of Present Illness: Donald Grant is a 38 y.o. male with a hx of AFib/Flutter, s/p AFlutter ablation in 2012, HL, HTN, frequent PVCs, ETOH abuse.  He was on Flecainide at one point in the past but he is no longer on AAD therapy.  Patient has declined genetic testing in the past for frequent PVCs (PVCs suggestive of channelopathy).  His CHADS2-VASc=1 and he has not been anticoagulated.  Last seen by Dr. [name] 05/2013.  BP was running high.  Given long 1st degree AV block, chronotropic agents were avoided.  ARB rx was to be considered in f/u.   He was in AFib at that visit with frequent PVCs on ECG.  F/u Echo was done and demonstrated worsening EF.  He was asked to follow up with me today.  Of note, he was seen in the ED 3/23 with complaints of chest pain.  CEs and CXR were unremarkable.  He has not had a recurrence of chest pain since he went to the emergency room. He denies exertional chest symptoms. He denies significant exertional dyspnea. He denies orthopnea, PND or edema. He denies syncope. He has occasional lightheadedness.   Studies:  - Echo (06/2011):  EF 50-55%, no RWMA, mild MR, mod LAE, mild RAE, PASP mildly increased.  - Cardiac MRI (07/2011):  EF 55%, no evidence of ARVC, no delayed enhancement  - Echo (06/23/13):  Mild LVH, EF 40-45%, diff HK, trivial AI, MAC, mild MR, mod BAE, PASP 33 mmHg.  - ETT (06/2011):  No ischemic changes; AFib with exercise => NSR in recovery; PVCs at rest - resolved with exercise.   Recent Labs: 06/16/2013: ALT 34; Creatinine 0.71; Hemoglobin 16.0; Potassium 4.0   Wt Readings from Last 3 Encounters:  06/16/13 240 lb (108.863 kg)  06/05/13 242 lb (109.77 kg)  06/02/13 241 lb 12.8 oz (109.68 kg)     Past Medical  History  Diagnosis Date  . Recurrent upper respiratory infection (URI)   . GERD (gastroesophageal reflux disease)   . Dysrhythmia     atrial flutter  . Dyslipidemia   . Atrial flutter     CTI ablation by Dr [name] 02/24/11  . Atrial tachycardia     mapped to AV node by Dr [name], ablation not performed  . Atrial fibrillation   . Premature ventricular contraction   . First degree AV block   . Gout     bilateral feet  . Lymphadenopathy of right cervical region ?Mono 04/16/2013    Current Outpatient Prescriptions  Medication Sig Dispense Refill  . acyclovir (ZOVIRAX) 400 MG tablet Take 400 mg by mouth 2 (two) times daily.       [name] allopurinol (ZYLOPRIM) 300 MG tablet Take 300 mg by mouth daily.      [name] aspirin 325 MG tablet Take 325 mg by mouth daily.      [name] atorvastatin (LIPITOR) 40 MG tablet Take 20 mg by mouth daily.       [name] b complex vitamins capsule Take 1 capsule by mouth daily.       . cholecalciferol (VITAMIN D) 1000 UNITS tablet Take 1,000 Units by mouth 2 (two) times daily.      . colchicine 0.6 MG tablet  Take 0.6 mg by mouth 2 (two) times daily.      . fish oil-omega-3 fatty acids 1000 MG capsule Take 2 g by mouth daily.        . Multiple Vitamin (MULITIVITAMIN WITH MINERALS) TABS Take 1 tablet by mouth daily.         No current facility-administered medications for this visit.    Allergies:   Review of patient's allergies indicates no known allergies.   Social History:  The patient  reports that he quit smoking about 2 years ago. He does not have any smokeless tobacco history on file. He reports that he drinks alcohol. He reports that he uses illicit drugs (Marijuana).   Family History:  The patient's family history includes Achondroplasia in his mother and son; Alcohol abuse in his mother; Heart disease in his mother.   ROS:  Please see the history of present illness.      All other systems reviewed and negative.   PHYSICAL EXAM: VS:  BP 130/78  Pulse 63  Ht 6\' 2"   (1.88 m)  Wt 235 lb (106.595 kg)  BMI 30.16 kg/m2 Well nourished, well developed, in no acute distress HEENT: normal Neck: no JVD Cardiac:  normal S1, S2; Irregularly irregular rhythm; no murmur Lungs:  clear to auscultation bilaterally, no wheezing, rhonchi or rales Abd: soft, nontender, no hepatomegaly Ext: no edema Skin: warm and dry Neuro:  CNs 2-12 intact, no focal abnormalities noted  EKG:  Atrial fibrillation, HR 63, frequent PVCs      ASSESSMENT AND PLAN:  1. Cardiomyopathy: Etiology not clear. Question if this is related to continued alcohol use. He also has a history of frequent PVCs. Previously, he has not had a high burden of PVCs. He has had recent chest pain. However, his symptoms are not classic for angina. He does have a family history of heart disease. I will arrange an ETT-Myoview to rule out ischemic heart disease. I will arrange a 24-hour Holter to reassess his PVC burden. I will start him on losartan 25 mg daily. Check a basic metabolic panel in one to 2 weeks. Beta blockers cannot be used given his prior history of long first degree AV block and bradycardia. I have asked him to eliminate alcohol. 2. Atrial fibrillation: Rate controlled. Anticoagulation has not been used in the past given low thromboembolic risk factor profile. Now that his ejection fraction is reduced, we may need to consider anticoagulation therapy. I will review this with Dr. Johney FrameAllred. 3. Essential hypertension: Controlled. 4. PVC's (premature ventricular contractions): Arrange 24-hour Holter as noted. 5. ETOH abuse: As noted, I have asked him to eliminate this. 6. Disposition: Follow up with Dr. Johney FrameAllred in one month.  Signed, Tereso NewcomerScott Tyshauna Finkbiner, PA-C  07/29/2013 9:58 AM

## 2013-07-29 NOTE — Telephone Encounter (Signed)
Follow up    Pt returning our call please call him back.  To update his information.

## 2013-07-30 ENCOUNTER — Encounter (INDEPENDENT_AMBULATORY_CARE_PROVIDER_SITE_OTHER): Payer: BC Managed Care – PPO

## 2013-07-30 ENCOUNTER — Encounter: Payer: Self-pay | Admitting: *Deleted

## 2013-07-30 DIAGNOSIS — I495 Sick sinus syndrome: Secondary | ICD-10-CM

## 2013-07-30 DIAGNOSIS — I4949 Other premature depolarization: Secondary | ICD-10-CM

## 2013-07-30 DIAGNOSIS — I493 Ventricular premature depolarization: Secondary | ICD-10-CM

## 2013-07-30 DIAGNOSIS — I4891 Unspecified atrial fibrillation: Secondary | ICD-10-CM

## 2013-07-30 DIAGNOSIS — I429 Cardiomyopathy, unspecified: Secondary | ICD-10-CM

## 2013-07-30 NOTE — Progress Notes (Signed)
Patient ID: Donald Grant, male   DOB: [birth year], 38 y.o.   MRN: [id number] E-Cardio 24 hour holter monitor applied to patient.

## 2013-08-04 ENCOUNTER — Telehealth: Payer: Self-pay | Admitting: Cardiology

## 2013-08-04 NOTE — Telephone Encounter (Signed)
He was seen by Tereso Newcomer not Dr. Johney Frame.  He needs to be worked in with EP this week

## 2013-08-04 NOTE — Telephone Encounter (Signed)
LVM for pt to return call.   Will also send to [facility] tatum to see about getting pt see by EP this week.

## 2013-08-04 NOTE — Telephone Encounter (Signed)
Please let patient know that Holter showed chronic atrial fibrillation that is rate controlled but he does have some bradycardia during sleep.  He is having a lot of increased PVC's and his PVC load is 39%.  He has multiple episodes of ventricular couplets and WCT that is either nonsustained VT or afib with aberration.Please have patient seen by EP tomorrow since he has had a recent decline in EF.  He sees Dr. Johney Frame but he his out all week.

## 2013-08-04 NOTE — Telephone Encounter (Signed)
Pt was just seen by allred on 5/5 and has a f/u appt on 6/3. Ok to wait ?

## 2013-08-05 ENCOUNTER — Encounter: Payer: Self-pay | Admitting: Internal Medicine

## 2013-08-05 ENCOUNTER — Ambulatory Visit (INDEPENDENT_AMBULATORY_CARE_PROVIDER_SITE_OTHER): Payer: BC Managed Care – PPO | Admitting: Internal Medicine

## 2013-08-05 VITALS — BP 162/78 | HR 66 | Ht 74.0 in | Wt 234.0 lb

## 2013-08-05 DIAGNOSIS — I4949 Other premature depolarization: Secondary | ICD-10-CM

## 2013-08-05 DIAGNOSIS — I429 Cardiomyopathy, unspecified: Secondary | ICD-10-CM

## 2013-08-05 DIAGNOSIS — I428 Other cardiomyopathies: Secondary | ICD-10-CM

## 2013-08-05 DIAGNOSIS — I493 Ventricular premature depolarization: Secondary | ICD-10-CM

## 2013-08-05 DIAGNOSIS — I10 Essential (primary) hypertension: Secondary | ICD-10-CM

## 2013-08-05 LAB — BASIC METABOLIC PANEL
BUN: 11 mg/dL (ref 6–23)
CALCIUM: 9.3 mg/dL (ref 8.4–10.5)
CO2: 27 mEq/L (ref 19–32)
CREATININE: 0.7 mg/dL (ref 0.4–1.5)
Chloride: 104 mEq/L (ref 96–112)
GFR: 125.32 mL/min (ref >60.00)
Glucose, Bld: 93 mg/dL (ref 70–99)
Potassium: 3.9 mEq/L (ref 3.5–5.1)
Sodium: 138 mEq/L (ref 135–145)

## 2013-08-05 LAB — MAGNESIUM: MAGNESIUM: 1.8 mg/dL (ref 1.5–2.5)

## 2013-08-05 NOTE — Telephone Encounter (Signed)
To Tresa Endo, I tried contacting pt and was unsuccessful.

## 2013-08-05 NOTE — Patient Instructions (Signed)
Your physician recommends that you continue on your current medications as directed. Please refer to the Current Medication list given to you today.  No follow up needed with Dr. Graciela Husbands at this time.  Keep follow up with Dr. Johney Frame next month.  Labs today: BMET, Magnesium

## 2013-08-05 NOTE — Progress Notes (Signed)
Patient Care Team: Carolyne FiscalPeter F Blomgren, MD as PCP - General (Family Medicine) Hillis RangeJames Allred, MD as Consulting Physician (Cardiology)   HPI  Donald Grant is a 38 y.o. male Seen as on the request of Dr. Armanda Magicraci Turner after she read as Holter monitor. He is a history of PVCs he has a history of paroxysmal atrial fibrillation with a CHADS-VASc score of one. He is a history of hypertension. He has a history of heavy alcohol    use in a sure that was read by Dr Mayford Knifeurner that raised a concern regarding  PVC burden  There also nocturnal pauses and nonsustained ventricular tachycardia  Echocardiogram 3/15 demonstrated ejection fraction of 40-45%. He was recently started on losartan  he is also recently been started on dabigitran.   He continues to drink heavily although only on weekends    Past Medical History  Diagnosis Date  . Recurrent upper respiratory infection (URI)   . GERD (gastroesophageal reflux disease)   . Dysrhythmia     atrial flutter  . Dyslipidemia   . Atrial flutter     CTI ablation by Dr Ladona Ridgelaylor 02/24/11  . Atrial tachycardia     mapped to AV node by Dr Ladona Ridgelaylor, ablation not performed  . Atrial fibrillation   . Premature ventricular contraction   . First degree AV block   . Gout     bilateral feet  . Lymphadenopathy of right cervical region ?Mono 04/16/2013    Past Surgical History  Procedure Laterality Date  . Root canal    . Mouth surgery    . Atrial ablation surgery  02/24/11    CTI ablation by Dr Ladona Ridgelaylor  . Cardioversion (bedside)  03/29/2011       . Cardioversion  03/29/2011    Procedure: CARDIOVERSION;  Surgeon: Lewayne BuntingBrian S Crenshaw, MD;  [facility];  Service: Cardiovascular;  Laterality: N/A;  . Examination under anesthesia  12/19/12    Anal Fistula    Current Outpatient Prescriptions  Medication Sig Dispense Refill  . acyclovir (ZOVIRAX) 400 MG tablet Take 400 mg by mouth 2 (two) times daily.       [name]. allopurinol (ZYLOPRIM) 300 MG tablet Take 300  mg by mouth daily.      [name]. atorvastatin (LIPITOR) 20 MG tablet Take 20 mg by mouth daily.      [name]. b complex vitamins capsule Take 1 capsule by mouth daily.       . cholecalciferol (VITAMIN D) 1000 UNITS tablet Take 1,000 Units by mouth 2 (two) times daily.      . colchicine 0.6 MG tablet Take 0.6 mg by mouth 2 (two) times daily.      . dabigatran (PRADAXA) 150 MG CAPS capsule Take 1 capsule (150 mg total) by mouth 2 (two) times daily.  60 capsule  11  . fish oil-omega-3 fatty acids 1000 MG capsule Take 2 g by mouth daily.        [name]. losartan (COZAAR) 25 MG tablet Take 1 tablet (25 mg total) by mouth daily.  30 tablet  11  . Multiple Vitamin (MULITIVITAMIN WITH MINERALS) TABS Take 1 tablet by mouth daily.         No current facility-administered medications for this visit.    No Known Allergies  Review of Systems negative except from HPI and PMH  Physical Exam BP 162/78  Pulse 66  Ht 6\' 2"  (1.88 m)  Wt 234 lb (106.142 kg)  BMI 30.03 kg/m2 Well  developed and well nourished in no acute distress HENT normal E scleral and icterus clear Neck Supple JVP flat; carotids brisk and full Clear to ausculation Irregularly irregular rate and rhythm, no murmurs gallops or rub Soft with active bowel sounds No clubbing cyanosis none Edema Alert and oriented, grossly normal motor and sensory function Skin Warm and Dry     Holter monitor demonstrates atrial fibrillation. There may be wide-complex beats. I suspect many of these are aberration. There is also nonsustained ventricular tachycardia     Assessment and plan   Atrial fibrillation with aberration   Nonsustained ventricular tachycardia   Biatrial enlargement/restrictive cardiomyopathy   Hypertension   Alcohol abuse   The patient has atrial fibrillation with frequent aberration. There is also nonsustained ventricular tachycardia. I'm concerned about his cardiomyopathy is biatrial enlargement suggesting restrictive process. With his  known ventricular ectopy, a question as is therea  unifying diagnosis.  Plan as ordered and put forth by Tereso Newcomer in Dr. Johney Frame. We'll check a metabolic profile and magnesium today.  He'll undergo stress testing next week will be following up with Dr. Johney Frame the next few weeks.

## 2013-08-06 ENCOUNTER — Telehealth: Payer: Self-pay | Admitting: *Deleted

## 2013-08-06 NOTE — Telephone Encounter (Signed)
pt notified about lab results with verbal understanding  

## 2013-08-11 ENCOUNTER — Ambulatory Visit (HOSPITAL_COMMUNITY): Payer: BC Managed Care – PPO | Attending: Cardiovascular Disease | Admitting: Radiology

## 2013-08-11 VITALS — BP 110/84 | HR 60 | Ht 74.0 in | Wt 233.0 lb

## 2013-08-11 DIAGNOSIS — I429 Cardiomyopathy, unspecified: Secondary | ICD-10-CM

## 2013-08-11 DIAGNOSIS — I493 Ventricular premature depolarization: Secondary | ICD-10-CM

## 2013-08-11 DIAGNOSIS — I10 Essential (primary) hypertension: Secondary | ICD-10-CM | POA: Insufficient documentation

## 2013-08-11 DIAGNOSIS — I4891 Unspecified atrial fibrillation: Secondary | ICD-10-CM | POA: Insufficient documentation

## 2013-08-11 DIAGNOSIS — I428 Other cardiomyopathies: Secondary | ICD-10-CM | POA: Insufficient documentation

## 2013-08-11 DIAGNOSIS — I4949 Other premature depolarization: Secondary | ICD-10-CM | POA: Insufficient documentation

## 2013-08-11 MED ORDER — TECHNETIUM TC 99M SESTAMIBI GENERIC - CARDIOLITE
33.0000 | Freq: Once | INTRAVENOUS | Status: AC | PRN
Start: 2013-08-11 — End: 2013-08-11
  Administered 2013-08-11: 33 via INTRAVENOUS

## 2013-08-11 MED ORDER — TECHNETIUM TC 99M SESTAMIBI GENERIC - CARDIOLITE
11.0000 | Freq: Once | INTRAVENOUS | Status: AC | PRN
  Administered 2013-08-11: 11 via INTRAVENOUS

## 2013-08-11 MED ORDER — REGADENOSON 0.4 MG/5ML IV SOLN
0.4000 mg | Freq: Once | INTRAVENOUS | Status: AC
  Administered 2013-08-11: 0.4 mg via INTRAVENOUS

## 2013-08-11 NOTE — Progress Notes (Signed)
[facility] SITE 3 NUCLEAR MED [address] [place], [place] [address] [phone]    Cardiology Nuclear Med Study  Donald Grant is a 38 y.o. male     MRN : [id number]     DOB: March 02, 1976  Procedure Date: 08/11/2013  Nuclear Med Background Indication for Stress Test:  Evaluation for Ischemia and 3/15 ER with Chest pain History:  No known CAD, afib, Echo 2015 EF 40-45%, MR ~2 yrs ago EF 55% Cardiac Risk Factors: Family History - CAD, History of Smoking, Hypertension and Lipids  Symptoms:  Chest Pain and Palpitations   Nuclear Pre-Procedure Caffeine/Decaff Intake:  8:00pm NPO After: 9:00am   Lungs:  clear O2 Sat: 99% on room air. IV 0.9% NS with Angio Cath:  22g  IV Site: R Hand  IV Started by:  Cathlyn Parsonsynthia Hasspacher, RN  Chest Size (in):  42 Cup Size: n/a  Height: 6\' 2"  (1.88 m)  Weight:  233 lb (105.688 kg)  BMI:  Body mass index is 29.9 kg/(m^2). Tech Comments:  n/a    Nuclear Med Study 1 or 2 day study: 1 day  Stress Test Type:  Treadmill/Lexiscan  Reading MD: n/a  Order Authorizing Provider:  Ferman HammingSteven Klein,MD and Jarold SongJames Allred,MD  Resting Radionuclide: Technetium 432m Sestamibi  Resting Radionuclide Dose: 11.0 mCi   Stress Radionuclide:  Technetium 2232m Sestamibi  Stress Radionuclide Dose: 33.0 mCi           Stress Protocol Rest HR: 60 Stress HR: 115  Rest BP: 110/84 Stress BP: 140/72  Exercise Time (min): n/a METS: n/a           Dose of Adenosine (mg):  n/a Dose of Lexiscan: 0.4 mg  Dose of Atropine (mg): n/a Dose of Dobutamine: n/a mcg/kg/min (at max HR)  Stress Test Technologist: Nelson ChimesSharon Brooks, BS-ES  Nuclear Technologist:  Doyne Keelonya Yount, CNMT     Rest Procedure:  Myocardial perfusion imaging was performed at rest 45 minutes following the intravenous administration of Technetium 632m Sestamibi. Rest ECG: Atrial fibrillation with a controlled rate. Frequent PVCs.  Stress Procedure:  The patient received IV Lexiscan 0.4 mg over 15-seconds with  concurrent low level exercise and then Technetium 932m Sestamibi was injected at 30-seconds while the patient continued walking one more minute.  Quantitative spect images were obtained after a 45-minute delay. Stress ECG: No significant change from baseline ECG  QPS Raw Data Images:  Normal; no motion artifact; normal heart/lung ratio. Stress Images:  There is a medium-sized area with mild decreased uptake affecting the basis/mid/apical inferior segments, apical cap, and mid inferolateral segment and apical lateral segment. This area is fixed Rest Images:  Rest images are the same as the stress images. Subtraction (SDS):  No evidence of ischemia. Transient Ischemic Dilatation (Normal <1.22):  1.00 Lung/Heart Ratio (Normal <0.45):  0.38  Quantitative Gated Spect Images QGS EDV:  n/a QGS ESV:  n/a  Impression Exercise Capacity:  Lexiscan with low level exercise. BP Response:  Normal blood pressure response. Clinical Symptoms:  Patient had fatigue ECG Impression:  No significant ST segment change suggestive of ischemia. Comparison with Prior Nuclear Study: No previous nuclear study performed  Overall Impression:  The study is abnormal. There is mild dilatation of the ventricle. There is suggestion of mild scar affecting the inferior wall and the apical inferolateral wall. There is no ischemia. Wall motion could not be assessed because of atrial fib and PVCs. The risk is indeterminate as I cannot assess ejection fraction.  LV  Ejection Fraction: Study not gated.  LV Wall Motion:  Study not gated.  Luis Abed , MD

## 2013-08-12 ENCOUNTER — Encounter: Payer: Self-pay | Admitting: Physician Assistant

## 2013-08-14 ENCOUNTER — Telehealth: Payer: Self-pay | Admitting: Internal Medicine

## 2013-08-14 NOTE — Telephone Encounter (Signed)
Aware shows no ischemia and to keep follow up

## 2013-08-14 NOTE — Telephone Encounter (Signed)
New message ° ° ° ° °Want stress test results °

## 2013-08-27 ENCOUNTER — Ambulatory Visit (INDEPENDENT_AMBULATORY_CARE_PROVIDER_SITE_OTHER): Payer: BC Managed Care – PPO | Admitting: Internal Medicine

## 2013-08-27 ENCOUNTER — Encounter: Payer: Self-pay | Admitting: Internal Medicine

## 2013-08-27 VITALS — BP 122/70 | HR 61 | Ht 74.0 in | Wt 235.0 lb

## 2013-08-27 DIAGNOSIS — I428 Other cardiomyopathies: Secondary | ICD-10-CM

## 2013-08-27 DIAGNOSIS — I4949 Other premature depolarization: Secondary | ICD-10-CM

## 2013-08-27 DIAGNOSIS — I4891 Unspecified atrial fibrillation: Secondary | ICD-10-CM

## 2013-08-27 DIAGNOSIS — I493 Ventricular premature depolarization: Secondary | ICD-10-CM

## 2013-08-27 DIAGNOSIS — I429 Cardiomyopathy, unspecified: Secondary | ICD-10-CM

## 2013-08-27 DIAGNOSIS — I10 Essential (primary) hypertension: Secondary | ICD-10-CM

## 2013-08-27 DIAGNOSIS — F1010 Alcohol abuse, uncomplicated: Secondary | ICD-10-CM

## 2013-08-27 NOTE — Patient Instructions (Addendum)
Your physician recommends that you schedule a follow-up appointment in: 2 months with Dr Verl Bangs up echo   Your physician has requested that you have an echocardiogram. Echocardiography is a painless test that uses sound waves to create images of your heart. It provides your doctor with information about the size and shape of your heart and how well your heart's chambers and valves are working. This procedure takes approximately one hour. There are no restrictions for this procedure.---in 6 weeks

## 2013-08-31 NOTE — Progress Notes (Signed)
PCP:  [name], MD  The patient presents today for routine electrophysiology followup. He remains active.  He was recently seen by both Tereso Newcomer and Dr Graciela Husbands (their notes are reviewed).  Though his EF is decreased, he is without symptoms.  He has since quite drinking ETOH.  Today, he denies symptoms of chest pain, shortness of breath, orthopnea, PND, lower extremity edema, dizziness, presyncope, syncope, or neurologic sequela.  The patient feels that he is tolerating medications without difficulties and is otherwise without complaint today.   Past Medical History  Diagnosis Date  . Recurrent upper respiratory infection (URI)   . GERD (gastroesophageal reflux disease)   . Dysrhythmia     atrial flutter  . Dyslipidemia   . Atrial flutter     CTI ablation by Dr [name] 02/24/11  . Atrial tachycardia     mapped to AV node by Dr [name], ablation not performed  . Atrial fibrillation   . Premature ventricular contraction   . First degree AV block   . Gout     bilateral feet  . Lymphadenopathy of right cervical region ?Mono 04/16/2013  . Hx of cardiovascular stress test     Lexiscan Myoview (07/2013):  Inf and inferolateral scar, no ischemia, no gated   Past Surgical History  Procedure Laterality Date  . Root canal    . Mouth surgery    . Atrial ablation surgery  02/24/11    CTI ablation by Dr [name]  . Cardioversion (bedside)  03/29/2011       . Cardioversion  03/29/2011    Procedure: CARDIOVERSION;  Surgeon: [name], MD;  Location: [facility] OR;  Service: Cardiovascular;  Laterality: N/A;  . Examination under anesthesia  12/19/12    Anal Fistula    Current Outpatient Prescriptions  Medication Sig Dispense Refill  . acyclovir (ZOVIRAX) 400 MG tablet Take 400 mg by mouth 2 (two) times daily.       [name] allopurinol (ZYLOPRIM) 300 MG tablet Take 300 mg by mouth daily.      [name] atorvastatin (LIPITOR) 20 MG tablet Take 20 mg by mouth daily.      [name] b complex vitamins capsule Take 1  capsule by mouth daily.       . cholecalciferol (VITAMIN D) 1000 UNITS tablet Take 1,000 Units by mouth 2 (two) times daily.      . colchicine 0.6 MG tablet Take 0.6 mg by mouth 2 (two) times daily.      . dabigatran (PRADAXA) 150 MG CAPS capsule Take 1 capsule (150 mg total) by mouth 2 (two) times daily.  60 capsule  11  . fish oil-omega-3 fatty acids 1000 MG capsule Take 2 g by mouth daily.        [name] losartan (COZAAR) 25 MG tablet Take 1 tablet (25 mg total) by mouth daily.  30 tablet  11  . Multiple Vitamin (MULITIVITAMIN WITH MINERALS) TABS Take 1 tablet by mouth daily.         No current facility-administered medications for this visit.    No Known Allergies  History   Social History  . Marital Status: Married    Spouse Name: N/A    Number of Children: N/A  . Years of Education: N/A   Occupational History  . Not on file.   Social History Main Topics  . Smoking status: Former Smoker    Quit date: 08/28/2010  . Smokeless tobacco: Not on file  . Alcohol Use: Yes  Comment: drinks beer 3 to 4 times a week  . Drug Use: Yes    Special: Marijuana     Comment: still currently smoking  . Sexual Activity: Yes   Other Topics Concern  . Not on file   Social History Narrative   His additional social history is notable that the patient exercises     several times a week, jogging up to 2 miles a day.  With exercise, he     notes that he has lost over 10 pounds.     FH- sister has first degree AV block and PVCs,  The patient's mother mother died suddenly in her 40s.  She apparently had a di12lated cardiomyopathy (per pt) and did not have an autopsy obtained.  A maternal aunt died suddenly but per pt had a h/o polysubstance abuse which was felt to be the cause.  She did not have an autopsy.  The patient's maternal grandmother died suddenly  In her 30s and no autopsy was performed per pt.  Physical Exam: Filed Vitals:   08/27/13 1128  BP: 122/70  Pulse: 61  Height: 6\' 2"  (1.88  m)  Weight: 235 lb (106.595 kg)    GEN- The patient is well appearing, alert and oriented x 3 today.   Head- normocephalic, atraumatic Eyes-  Sclera clear, conjunctiva pink Ears- hearing intact Oropharynx- clear Neck- supple, no JVP Lungs- Clear to ausculation bilaterally, normal work of breathing Heart- irregular rate and rhythm, no murmurs, rubs or gallops, PMI not laterally displaced GI- soft, NT, ND, + BS Extremities- no clubbing, cyanosis, or edema  ekg today reveals afib slow ventricular rate and bigeminal PVCs vs ashman beats,  septal infarct, nonspecific ST/ T changes   Cardiac MRI 08/09/11 reviewed Recent echo, myoview, and monitor are also reviewed  Assessment and Plan:  1. Persistent atrial fibrillation asymptomatic chads2vasc score is now 2.  He is anticoagulated with pradaxa Would avoid AAD therapy at this time He reports complete abstinence from ETOH  2. htn bp is elevated today.  Repeat BP is 138/82 improved  3. PVCs Stable but concerning long term Given bradycardia, I would not advise beta blocker at this time  4. Heavy ETOH use Cessation is strongly advised  5. 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  I find his overall arrhythmia (PVC/Afib/ bradycardia with AV block) to be concerning and now his EF is reduced.  Dr Graciela HusbandsKlein and I have spoken about him at length.  Presently, I think that we have to push ETOH avoidance. If his EF does not normalize then further testing is planned.  There is presently no indication for device implantation however I will follow him closely.  Return in 2 months He will contact me if problems arise in the interim

## 2013-10-06 ENCOUNTER — Ambulatory Visit (HOSPITAL_COMMUNITY): Payer: BC Managed Care – PPO | Attending: Cardiology | Admitting: Cardiology

## 2013-10-06 DIAGNOSIS — I059 Rheumatic mitral valve disease, unspecified: Secondary | ICD-10-CM | POA: Insufficient documentation

## 2013-10-06 DIAGNOSIS — I517 Cardiomegaly: Secondary | ICD-10-CM | POA: Insufficient documentation

## 2013-10-06 DIAGNOSIS — I10 Essential (primary) hypertension: Secondary | ICD-10-CM | POA: Insufficient documentation

## 2013-10-06 DIAGNOSIS — E785 Hyperlipidemia, unspecified: Secondary | ICD-10-CM | POA: Insufficient documentation

## 2013-10-06 DIAGNOSIS — I359 Nonrheumatic aortic valve disorder, unspecified: Secondary | ICD-10-CM | POA: Insufficient documentation

## 2013-10-06 DIAGNOSIS — I429 Cardiomyopathy, unspecified: Secondary | ICD-10-CM

## 2013-10-06 DIAGNOSIS — I4891 Unspecified atrial fibrillation: Secondary | ICD-10-CM | POA: Insufficient documentation

## 2013-10-06 DIAGNOSIS — I428 Other cardiomyopathies: Secondary | ICD-10-CM | POA: Insufficient documentation

## 2013-10-06 DIAGNOSIS — Z87891 Personal history of nicotine dependence: Secondary | ICD-10-CM | POA: Insufficient documentation

## 2013-10-06 DIAGNOSIS — I079 Rheumatic tricuspid valve disease, unspecified: Secondary | ICD-10-CM | POA: Insufficient documentation

## 2013-10-06 NOTE — Progress Notes (Signed)
Echo performed. 

## 2013-10-24 ENCOUNTER — Other Ambulatory Visit: Payer: Self-pay | Admitting: Nurse Practitioner

## 2013-10-24 DIAGNOSIS — I519 Heart disease, unspecified: Secondary | ICD-10-CM

## 2013-10-28 ENCOUNTER — Ambulatory Visit (HOSPITAL_COMMUNITY)
Admission: RE | Admit: 2013-10-28 | Discharge: 2013-10-28 | Disposition: A | Discharge status: Home / Self Care | Payer: BC Managed Care – PPO | Source: Ambulatory Visit | Attending: Internal Medicine | Admitting: Internal Medicine

## 2013-10-28 DIAGNOSIS — I519 Heart disease, unspecified: Secondary | ICD-10-CM | POA: Insufficient documentation

## 2013-10-28 LAB — CREATININE, SERUM
CREATININE: 0.9 mg/dL (ref 0.50–1.35)
GFR calc Af Amer: >90 mL/min (ref >90)

## 2013-10-28 MED ORDER — GADOBENATE DIMEGLUMINE 529 MG/ML IV SOLN
37.0000 mL | Freq: Once | INTRAVENOUS | Status: AC | PRN
  Administered 2013-10-28: 37 mL via INTRAVENOUS

## 2013-11-05 ENCOUNTER — Ambulatory Visit (INDEPENDENT_AMBULATORY_CARE_PROVIDER_SITE_OTHER): Payer: BC Managed Care – PPO | Admitting: Internal Medicine

## 2013-11-05 ENCOUNTER — Encounter: Payer: Self-pay | Admitting: Internal Medicine

## 2013-11-05 VITALS — BP 126/88 | HR 62 | Ht 74.0 in | Wt 230.0 lb

## 2013-11-05 DIAGNOSIS — I10 Essential (primary) hypertension: Secondary | ICD-10-CM

## 2013-11-05 DIAGNOSIS — I4819 Other persistent atrial fibrillation: Secondary | ICD-10-CM

## 2013-11-05 DIAGNOSIS — I4949 Other premature depolarization: Secondary | ICD-10-CM

## 2013-11-05 DIAGNOSIS — I429 Cardiomyopathy, unspecified: Secondary | ICD-10-CM

## 2013-11-05 DIAGNOSIS — I4891 Unspecified atrial fibrillation: Secondary | ICD-10-CM

## 2013-11-05 DIAGNOSIS — F1010 Alcohol abuse, uncomplicated: Secondary | ICD-10-CM

## 2013-11-05 DIAGNOSIS — I493 Ventricular premature depolarization: Secondary | ICD-10-CM

## 2013-11-05 DIAGNOSIS — I428 Other cardiomyopathies: Secondary | ICD-10-CM

## 2013-11-05 NOTE — Progress Notes (Signed)
PCP:  [name], MD  The patient presents today for routine electrophysiology followup. He remains active.  Unfortunately he is now drinking ETOH again.  His MRI reveals EF 40% without scar.  He remains asymptomatic with afib and reduced EF.  Today, he denies symptoms of chest pain, shortness of breath, orthopnea, PND, lower extremity edema, dizziness, presyncope, syncope, or neurologic sequela.  The patient feels that he is tolerating medications without difficulties and is otherwise without complaint today.   Past Medical History  Diagnosis Date  . Recurrent upper respiratory infection (URI)   . GERD (gastroesophageal reflux disease)   . Dysrhythmia     atrial flutter  . Dyslipidemia   . Atrial flutter     CTI ablation by Dr [name] 02/24/11  . Atrial tachycardia     mapped to AV node by Dr [name], ablation not performed  . Atrial fibrillation   . Premature ventricular contraction   . First degree AV block   . Gout     bilateral feet  . Lymphadenopathy of right cervical region ?Mono 04/16/2013  . Hx of cardiovascular stress test     Lexiscan Myoview (07/2013):  Inf and inferolateral scar, no ischemia, no gated   Past Surgical History  Procedure Laterality Date  . Root canal    . Mouth surgery    . Atrial ablation surgery  02/24/11    CTI ablation by Dr [name]  . Cardioversion (bedside)  03/29/2011       . Cardioversion  03/29/2011    Procedure: CARDIOVERSION;  Surgeon: [name], MD;  Location: [facility] OR;  Service: Cardiovascular;  Laterality: N/A;  . Examination under anesthesia  12/19/12    Anal Fistula    Current Outpatient Prescriptions  Medication Sig Dispense Refill  . acyclovir (ZOVIRAX) 400 MG tablet Take 400 mg by mouth 2 (two) times daily.       [name] allopurinol (ZYLOPRIM) 300 MG tablet Take 300 mg by mouth daily.      [name] atorvastatin (LIPITOR) 20 MG tablet Take 20 mg by mouth daily.      [name] b complex vitamins capsule Take 1 capsule by mouth daily.       .  cholecalciferol (VITAMIN D) 1000 UNITS tablet Take 1,000 Units by mouth 2 (two) times daily.      . colchicine 0.6 MG tablet Take 0.6 mg by mouth 2 (two) times daily.      . dabigatran (PRADAXA) 150 MG CAPS capsule Take 1 capsule (150 mg total) by mouth 2 (two) times daily.  60 capsule  11  . fish oil-omega-3 fatty acids 1000 MG capsule Take 2 g by mouth daily.        [name] losartan (COZAAR) 25 MG tablet Take 1 tablet (25 mg total) by mouth daily.  30 tablet  11  . Multiple Vitamin (MULITIVITAMIN WITH MINERALS) TABS Take 1 tablet by mouth daily.         No current facility-administered medications for this visit.    No Known Allergies  History   Social History  . Marital Status: Married    Spouse Name: N/A    Number of Children: N/A  . Years of Education: N/A   Occupational History  . Not on file.   Social History Main Topics  . Smoking status: Former Smoker    Quit date: 08/28/2010  . Smokeless tobacco: Not on file  . Alcohol Use: Yes     Comment: drinks beer 3 to 4 times a  week  . Drug Use: Yes    Special: Marijuana     Comment: still currently smoking  . Sexual Activity: Yes   Other Topics Concern  . Not on file   Social History Narrative   His additional social history is notable that the patient exercises     several times a week, jogging up to 2 miles a day.  With exercise, he     notes that he has lost over 10 pounds.     FH- sister has first degree AV block and PVCs,  The patient's mother mother died suddenly in her 3840s.  She apparently had a dilated cardiomyopathy (per pt) and did not have an autopsy obtained.  A maternal aunt died suddenly but per pt had a h/o polysubstance abuse which was felt to be the cause.  She did not have an autopsy.  The patient's maternal grandmother died suddenly  In her 30s and no autopsy was performed per pt.  Physical Exam: Filed Vitals:   11/05/13 0914  BP: 126/88  Pulse: 62  Height: 6\' 2"  (1.88 m)  Weight: 230 lb (104.327 kg)     GEN- The patient is well appearing, alert and oriented x 3 today.   Head- normocephalic, atraumatic Eyes-  Sclera clear, conjunctiva pink Ears- hearing intact Oropharynx- clear Neck- supple, no JVP Lungs- Clear to ausculation bilaterally, normal work of breathing Heart- irregular rate and rhythm, no murmurs, rubs or gallops, PMI not laterally displaced GI- soft, NT, ND, + BS Extremities- no clubbing, cyanosis, or edema  ekg today reveals afib slow ventricular rate and frequent PVCs vs ashman beats,  septal infarct, nonspecific ST/ T changes   Cardiac MRI reviewed  Assessment and Plan:  1. Persistent atrial fibrillation asymptomatic chads2vasc score is now 2.  He is anticoagulated with pradaxa Would avoid AAD therapy at this time.  Chronic bradycardia (even in sinus) limit therapies. ETOH avoidance is encouraged  2. 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  4. Heavy ETOH use Cessation is strongly advised  5. 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  Cardiac MRI is reviewed. 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  This may also be playing a part in his reduced EF.  Genetic testing could be helpful.  His sister is followed by Dr Graciela HusbandsKlein and has longstanding bradycardia, prolonged first degree AV block, and ventricular ectopy. Complete ETOH avoidance is planned  I find his overall arrhythmia (PVC/Afib/ bradycardia with AV block) to be concerning and now his EF is reduced.  I am going to refer him to Dr Shirlee LatchMcLean for further management of his cardiomyopathy.  He does not presently meet criteria for an ICD.  He is considering tertiary referral to EP for additional opinions.  I have encouraged this.  I am not certain who would be the best to see him.  I think that [facility] or [facility] would be the best options.  Return in 3 months He will contact me if problems arise in the interim

## 2013-11-05 NOTE — Patient Instructions (Signed)
Your physician recommends that you schedule a follow-up appointment in: 3 months with Dr. Johney Frame.  Your physician recommends that you schedule an appointment with Dr. Shirlee Latch in the Heart Failure Clinic at [facility].  Your physician recommends that you continue on your current medications as directed. Please refer to the Current Medication list given to you today.

## 2013-12-02 ENCOUNTER — Encounter (HOSPITAL_COMMUNITY): Payer: Self-pay

## 2013-12-02 ENCOUNTER — Ambulatory Visit (HOSPITAL_COMMUNITY)
Admission: RE | Admit: 2013-12-02 | Discharge: 2013-12-02 | Disposition: A | Discharge status: Home / Self Care | Payer: BC Managed Care – PPO | Source: Ambulatory Visit | Attending: Cardiology | Admitting: Cardiology

## 2013-12-02 VITALS — BP 134/72 | HR 48 | Ht 74.0 in | Wt 235.0 lb

## 2013-12-02 DIAGNOSIS — I428 Other cardiomyopathies: Secondary | ICD-10-CM | POA: Diagnosis present

## 2013-12-02 DIAGNOSIS — F1010 Alcohol abuse, uncomplicated: Secondary | ICD-10-CM | POA: Diagnosis not present

## 2013-12-02 DIAGNOSIS — I4892 Unspecified atrial flutter: Secondary | ICD-10-CM | POA: Insufficient documentation

## 2013-12-02 DIAGNOSIS — I10 Essential (primary) hypertension: Secondary | ICD-10-CM | POA: Diagnosis not present

## 2013-12-02 DIAGNOSIS — I4949 Other premature depolarization: Secondary | ICD-10-CM | POA: Diagnosis not present

## 2013-12-02 DIAGNOSIS — I4891 Unspecified atrial fibrillation: Secondary | ICD-10-CM | POA: Diagnosis not present

## 2013-12-02 DIAGNOSIS — I482 Chronic atrial fibrillation, unspecified: Secondary | ICD-10-CM

## 2013-12-02 DIAGNOSIS — I429 Cardiomyopathy, unspecified: Secondary | ICD-10-CM

## 2013-12-02 DIAGNOSIS — I493 Ventricular premature depolarization: Secondary | ICD-10-CM

## 2013-12-02 MED ORDER — SACUBITRIL-VALSARTAN 24-26 MG PO TABS: 24.0000 mg | ORAL_TABLET | Freq: Two times a day (BID) | ORAL | Status: DC

## 2013-12-02 MED ORDER — SPIRONOLACTONE 25 MG PO TABS: 12.5000 mg | ORAL_TABLET | Freq: Every day | ORAL | Status: DC

## 2013-12-02 NOTE — Patient Instructions (Signed)
START Entresto 24/26 mg Once you receive Entresto STOP TAKING Losartan Start Spironolactone 12.5 mg daily  Labs needed in 10 days (BMET,BNP)  Your physician recommends that you schedule a follow-up appointment in: 1 month  .

## 2013-12-02 NOTE — Progress Notes (Signed)
Patient ID: Donald Grant, male   DOB: [birth year], 38 y.o.   MRN: [id number] PCP: Dr. Duaine Dredge  38 yo with history of cardiomyopathy, heavy ETOH use, chronic atrial fibrillation with bradycardia, and frequent PVCs.  Patient has a long EP history.  He has chronic atrial fibrillation with slow ventricular response.  He is not on any nodal blocking agents.  He had atrial flutter ablation in 2012 and also has atrial tachycardia with focus near the AV node that has not been ablated.  He had a holter in 5/15 showing a 3.8 second pause, predominant atrial fibrillation, and 39% PVCs (some of this was likely atrial fibrillation with aberrancy).  He had runs of NSVT. Low HR has limited treatment of PVCs/NSVT.  He is not on an antiarrhythmic.  He has a strong family history of sudden cardiac death.  Cardiac MRI in 11/26/22 did not show evidence for ARVC.    He was referred to CHF clinic for management of his cardiomyopathy.  Echo in 7/15 suggested EF 30% but difficult to quantify due to PVCs.  Cardiac MRI in Nov 26, 2022 showed EF 45% with normal RV and a nonspecific RV insertion site delayed enhancement pattern.  He stopped ETOH completely about 3 wks ago. Prior to that, he drank heavily.  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  He is not getting a lot of sleep due to work and family.  Some general fatigue.  However, he thinks that he could jog a mile without much problem.  No exertional dyspnea. No orthopnea, PND, lightheadedness, or syncope.  He has rare atypical chest pain that tends to be associated with palpitations.    ECG 11/26/22): Atrial fibrillation with PVCs versus aberrantly conducted supraventricular beats.  Labs (5/15): K 3.9, creatinine 0.7 Labs 11/26/2022): creatinine 0.9  PMH: 1. Prior heavy ETOH use.  2. Chronic atrial fibrillation: Bradycardic.  CHADSVASC = 2, he is on Pradaxa.   3. HTN 4. Atrial flutter: s/p ablation 11/12 5. Atrial tachycardia: Mapped  to AV node, not ablated.  6. Ventricular arrhythmias: NSVT, also very frequent PVCs. Some of beats that seem to be PVCs are likely aberrantly conducted atrial fibrillation.  Holter (5/15) with 3.8 second pause, atrial fibrillation predominantly, 39% PVCs (some of this likely is atrial fibrillation with aberrancy), runs of WCT (suspected NSVT).  7. Cardiomyopathy: Echo (7/15) with EF 30%, mild diffuse hypokinesis, mild LV dilation.  Cardiac MRI 11/26/22) with moderate LV dilation, EF 45%, diffuse hypokinesis, normal RV with no evidence for ARVC, delayed enhancement at the RV insertion sites which is a nonspecific pattern.  Lexiscan Cardiolite (5/15) with mild scar in the inferior and inferoseptal walls, no ischemia, not gated due to PVCs.  8. 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  No tobacco.  Occasional marijuana.  Never used cocaine.  Prior heavy ETOH but has not drunk x >3 wks.   FH: Mother with SCD in her 72s and dilated cardiomyopathy.  Maternal grandmother with SCD in her 80s, great-aunt with SCD, sister with PVCs.   ROS: All systems reviewed and negative except as per HPI.   Current Outpatient Prescriptions  Medication Sig Dispense Refill  . acyclovir (ZOVIRAX) 400 MG tablet Take 400 mg by mouth 2 (two) times daily.       [name] allopurinol (ZYLOPRIM) 300 MG tablet Take 300 mg by mouth daily.      [name]  atorvastatin (LIPITOR) 20 MG tablet Take 20 mg by mouth daily.      [name] b complex vitamins capsule Take 1 capsule by mouth daily.       . cholecalciferol (VITAMIN D) 1000 UNITS tablet Take 1,000 Units by mouth 2 (two) times daily.      . colchicine 0.6 MG tablet Take 0.6 mg by mouth 2 (two) times daily.      . dabigatran (PRADAXA) 150 MG CAPS capsule Take 1 capsule (150 mg total) by mouth 2 (two) times daily.  60 capsule  11  . fish oil-omega-3 fatty acids 1000 MG capsule Take 2 g by mouth daily.        . Multiple Vitamin (MULITIVITAMIN WITH MINERALS) TABS  Take 1 tablet by mouth daily.        . Sacubitril-Valsartan (ENTRESTO) 24-26 MG TABS Take 24-26 mg by mouth 2 (two) times daily.  60 tablet  6  . spironolactone (ALDACTONE) 25 MG tablet Take 0.5 tablets (12.5 mg total) by mouth daily.  15 tablet  3   No current facility-administered medications for this encounter.    BP 134/72  Pulse 48  Ht  (1.88 m)  Wt 235 lb (106.595 kg)  BMI 30.16 kg/m2  SpO2 99% General: NAD Neck: No JVD, no thyromegaly or thyroid nodule.  Lungs: Clear to auscultation bilaterally with normal respiratory effort. CV: Nondisplaced PMI.  Heart regular S1/S2, no S3/S4, no murmur.  No peripheral edema.  No carotid bruit.  Normal pedal pulses.  Abdomen: Soft, nontender, no hepatosplenomegaly, no distention.  Skin: Intact without lesions or rashes.  Neurologic: Alert and oriented x 3.  Psych: Normal affect. Extremities: No clubbing or cyanosis.  HEENT: Normal.   Assessment/Plan:  1. Cardiomyopathy: EF 30% by echo, 45% by cardiac MRI.  Difficult to measure EF with frequent PVCs vs aberrant beats.  Suspect nonischemic cardiomyopathy, Cardiolite in 7/15 showed mild inferior scar with no ischemia, suspect this may have been attenuation.  Heavy ETOH long-term may play a role in the cardiomyopathy.  Also much consider familial cardiomyopathy.  Finally, he has very frequent PVCs versus aberrant beats.  Very frequent PVCs are known to cause cardiomyopathy.  NYHA class I-II symptoms, he is not volume overloaded.  - Address underlying causes.  He has stopped drinking and I encouraged him to stay off ETOH.  I will need to talk to Dr. Johney Frame about PVCs.  I am not sure that all of the 39% burden of wide complex beats on holter were PVCs.  Much of this may have been atrial fibrillation with aberrancy.  Given significant bradycardia, I cannot safely treat him with beta blockers or antiarrhythmics.  I will talk to Dr Johney Frame about role (if any) for PVC ablation here.   - I will have him  start spironolactone 12.5 mg daily - I will have him stop losartan and start Entresto 24/26 bid.  He will call if he has any problems with hypotension/dizziness, but he appears to have plenty of BP room.  - BMET/BNP in 10 days.  - Down the road AV nodal ablation with BiV pacing would be an option to allow treatment of arrhythmias though would like to avoid if possible.  - Repeat echo in about 6 months.  2. Atrial fibrillation: Chronic atrial fibrillation with bradycardic ventricular response.  Avoiding beta blocker, antiarrhythmics therefore.  He is on Pradaxa with no bleeding sequelae.  3. Atrial flutter: s/p ablation.  4. Atrial tachycardia: Prior history, mapped to area  near AV node.  5. Family history of SCD: Patient may have a channelopathy with atrial fibrillation, bradycardia, PVCs/NSVT, and cardiomyopathy.  Genetic testing has been discussed during his EP visits and he is considering.  I think this would be reasonable given his siblings and young children.  Will defer to Drs Klein/Allred.  6. PVCs/NSVT: As per above discussion,  He has frequent wide complex beats, PVCs versus atrial fibrillation with aberrancy. Bradycardia limits treatment options.  Will discuss with Dr Johney Frame, is PVC ablation an option?   Marca Ancona 12/02/2013

## 2013-12-05 ENCOUNTER — Telehealth (HOSPITAL_COMMUNITY): Payer: Self-pay | Admitting: Vascular Surgery

## 2013-12-05 NOTE — Telephone Encounter (Signed)
Pt is having problem with getting his medications... Entrusto prior [place].[name]

## 2013-12-08 NOTE — Telephone Encounter (Signed)
Spoke with pt and pts pharmacy Pt needed to drop off savings card to get meds

## 2013-12-19 ENCOUNTER — Other Ambulatory Visit: Payer: BC Managed Care – PPO

## 2013-12-19 DIAGNOSIS — I429 Cardiomyopathy, unspecified: Secondary | ICD-10-CM

## 2013-12-19 LAB — BASIC METABOLIC PANEL
BUN: 12 mg/dL (ref 6–23)
CO2: 27 mEq/L (ref 19–32)
CREATININE: 0.83 mg/dL (ref 0.50–1.35)
Calcium: 9.3 mg/dL (ref 8.4–10.5)
Chloride: 103 mEq/L (ref 96–112)
Glucose, Bld: 90 mg/dL (ref 70–99)
Potassium: 4.3 mEq/L (ref 3.5–5.3)
Sodium: 141 mEq/L (ref 135–145)

## 2013-12-20 LAB — PRO B NATRIURETIC PEPTIDE: Pro B Natriuretic peptide (BNP): 699.9 pg/mL — ABNORMAL HIGH (ref <126)

## 2013-12-29 ENCOUNTER — Telehealth (HOSPITAL_COMMUNITY): Payer: Self-pay | Admitting: *Deleted

## 2013-12-29 NOTE — Telephone Encounter (Signed)
Received fax from [facility] today pt's Donald Grant has been approved from [birth year], reference # [id number]

## 2014-01-02 ENCOUNTER — Ambulatory Visit (HOSPITAL_COMMUNITY)
Admission: RE | Admit: 2014-01-02 | Discharge: 2014-01-02 | Disposition: A | Discharge status: Home / Self Care | Payer: BC Managed Care – PPO | Source: Ambulatory Visit | Attending: Cardiology | Admitting: Cardiology

## 2014-01-02 ENCOUNTER — Encounter (HOSPITAL_COMMUNITY): Payer: Self-pay

## 2014-01-02 ENCOUNTER — Telehealth (HOSPITAL_COMMUNITY): Payer: Self-pay | Admitting: Vascular Surgery

## 2014-01-02 VITALS — BP 110/80 | HR 44 | Wt 231.8 lb

## 2014-01-02 DIAGNOSIS — I429 Cardiomyopathy, unspecified: Secondary | ICD-10-CM | POA: Diagnosis not present

## 2014-01-02 DIAGNOSIS — Z79899 Other long term (current) drug therapy: Secondary | ICD-10-CM | POA: Insufficient documentation

## 2014-01-02 DIAGNOSIS — I482 Chronic atrial fibrillation, unspecified: Secondary | ICD-10-CM

## 2014-01-02 DIAGNOSIS — I10 Essential (primary) hypertension: Secondary | ICD-10-CM | POA: Insufficient documentation

## 2014-01-02 DIAGNOSIS — I471 Supraventricular tachycardia: Secondary | ICD-10-CM | POA: Diagnosis not present

## 2014-01-02 DIAGNOSIS — M109 Gout, unspecified: Secondary | ICD-10-CM | POA: Diagnosis not present

## 2014-01-02 MED ORDER — SACUBITRIL-VALSARTAN 49-51 MG PO TABS: 49.0000 mg | ORAL_TABLET | Freq: Two times a day (BID) | ORAL | Status: DC

## 2014-01-02 MED ORDER — SACUBITRIL-VALSARTAN 24-26 MG PO TABS: 49.0000 mg | ORAL_TABLET | Freq: Two times a day (BID) | ORAL | Status: DC

## 2014-01-02 NOTE — Telephone Encounter (Signed)
Pharmacy called they need to verify dosage for Entrusto.. Please advise

## 2014-01-02 NOTE — Patient Instructions (Signed)
INCREASE Entresto to 49/51 mg twice a day  Labs needed in 10 days.  Your physician recommends that you schedule a follow-up appointment in: 2 months

## 2014-01-02 NOTE — Telephone Encounter (Signed)
rx resent into pharmacy Discontinued previous rx and re entered correct dosage

## 2014-01-04 NOTE — Progress Notes (Signed)
Patient ID: Donald Grant, male   DOB: 12-19-1975, 38 y.o.   MRN: [id number] PCP: Dr. Duaine Dredge  38 yo with history of cardiomyopathy, heavy ETOH use, chronic atrial fibrillation with bradycardia, and frequent PVCs.  Patient has a long EP history.  He has chronic atrial fibrillation with slow ventricular response.  He is not on any nodal blocking agents.  He had atrial flutter ablation in 2012 and also has atrial tachycardia with focus near the AV node that has not been ablated.  He had a holter in 5/15 showing a 3.8 second pause, predominant atrial fibrillation, and 39% PVCs (some of this was likely atrial fibrillation with aberrancy).  He had runs of NSVT. Low HR has limited treatment of PVCs/NSVT.  He is not on an antiarrhythmic.  He has a strong family history of sudden cardiac death.  Cardiac MRI in 2022/12/05 did not show evidence for ARVC.    He was referred to CHF clinic for management of his cardiomyopathy.  Echo in 7/15 suggested EF 30% but difficult to quantify due to PVCs.  Cardiac MRI in 12-05-22 showed EF 45% with normal RV and a nonspecific RV insertion site delayed enhancement pattern.  He stopped ETOH completely > 50 days ago. Prior to that, he drank heavily.  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  He is not getting a lot of sleep due to work and family.  Some general fatigue.  However, he thinks that he could jog a mile without much problem.  No exertional dyspnea. No orthopnea, PND, lightheadedness, or syncope.  He has rare atypical chest pain that tends to be associated with palpitations.  He tolerated initiation of Entresto at last appointment without any problems. HR remains slow.  He feels occasional palpitations.   ECG 12-05-2022): Atrial fibrillation with PVCs versus aberrantly conducted supraventricular beats.  Labs (5/15): K 3.9, creatinine 0.7 Labs 12-05-2022): creatinine 0.9 Labs (9/15): K 4.3, creatinine 0.83, pBNP 700  PMH: 1. Prior  heavy ETOH use.  2. Chronic atrial fibrillation: Bradycardic.  CHADSVASC = 2, he is on Pradaxa.   3. HTN 4. Atrial flutter: s/p ablation 11/12 5. Atrial tachycardia: Mapped to AV node, not ablated.  6. Ventricular arrhythmias: NSVT, also very frequent PVCs. Some of beats that seem to be PVCs are likely aberrantly conducted atrial fibrillation.  Holter (5/15) with 3.8 second pause, atrial fibrillation predominantly, 39% PVCs (some of this likely is atrial fibrillation with aberrancy), runs of WCT (suspected NSVT).  7. Cardiomyopathy: Echo (7/15) with EF 30%, mild diffuse hypokinesis, mild LV dilation.  Cardiac MRI December 05, 2022) with moderate LV dilation, EF 45%, diffuse hypokinesis, normal RV with no evidence for ARVC, delayed enhancement at the RV insertion sites which is a nonspecific pattern.  Lexiscan Cardiolite (5/15) with mild scar in the inferior and inferoseptal walls, no ischemia, not gated due to PVCs.  8. 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  No tobacco.  Occasional marijuana.  Never used cocaine.  Prior heavy ETOH but has not drunk x >3 wks.   FH: Mother with SCD in her 71s and dilated cardiomyopathy.  Maternal grandmother with SCD in her 63s, great-aunt with SCD, sister with PVCs.   ROS: All systems reviewed and negative except as per HPI.   Current Outpatient Prescriptions  Medication Sig Dispense Refill  . allopurinol (ZYLOPRIM) 300 MG tablet Take  300 mg by mouth daily.      [name] atorvastatin (LIPITOR) 20 MG tablet Take 20 mg by mouth daily.      [name] b complex vitamins capsule Take 1 capsule by mouth daily.       . cholecalciferol (VITAMIN D) 1000 UNITS tablet Take 1,000 Units by mouth 2 (two) times daily.      . colchicine 0.6 MG tablet Take 0.6 mg by mouth 2 (two) times daily.      . dabigatran (PRADAXA) 150 MG CAPS capsule Take 1 capsule (150 mg total) by mouth 2 (two) times daily.  60 capsule  11  . fish oil-omega-3 fatty acids 1000 MG  capsule Take 2 g by mouth daily.        . Multiple Vitamin (MULITIVITAMIN WITH MINERALS) TABS Take 1 tablet by mouth daily.        [name] spironolactone (ALDACTONE) 25 MG tablet Take 0.5 tablets (12.5 mg total) by mouth daily.  15 tablet  3  . acyclovir (ZOVIRAX) 400 MG tablet Take 400 mg by mouth 2 (two) times daily.       . Sacubitril-Valsartan (ENTRESTO) 49-51 MG TABS Take 49-51 mg by mouth 2 (two) times daily.  60 tablet  6   No current facility-administered medications for this encounter.    BP 110/80  Pulse 44  Wt 231 lb 12.8 oz (105.144 kg)  SpO2 99% General: NAD Neck: No JVD, no thyromegaly or thyroid nodule.  Lungs: Clear to auscultation bilaterally with normal respiratory effort. CV: Nondisplaced PMI.  Heart regular S1/S2, no S3/S4, no murmur.  No peripheral edema.  No carotid bruit.  Normal pedal pulses.  Abdomen: Soft, nontender, no hepatosplenomegaly, no distention.  Skin: Intact without lesions or rashes.  Neurologic: Alert and oriented x 3.  Psych: Normal affect. Extremities: No clubbing or cyanosis.  HEENT: Normal.   Assessment/Plan:  1. Cardiomyopathy: EF 30% by echo, 45% by cardiac MRI.  Difficult to measure EF with frequent PVCs vs aberrant beats.  Suspect nonischemic cardiomyopathy, Cardiolite in 7/15 showed mild inferior scar with no ischemia, suspect this may have been attenuation.  Heavy ETOH long-term may play a role in the cardiomyopathy.  Also much consider familial cardiomyopathy.  Finally, he has very frequent PVCs versus aberrant beats.  Very frequent PVCs are known to cause cardiomyopathy.  NYHA class I-II symptoms, he is not volume overloaded.  - Address underlying causes.  He has stopped drinking and I encouraged him to stay off ETOH => he asks if he can drink occasionally but I think it might be best to stay totally abstinent for now.  I am not sure that all of the 39% burden of wide complex beats on holter were PVCs.  Much of this may have been atrial  fibrillation with aberrancy.  However, there are certainly a lot of PVCs present.  Given significant bradycardia, I cannot safely treat him with beta blockers or antiarrhythmics.  He is going to see Dr Johney Frame again in 11/15, role for PVC ablation will need to be addressed. - Continue spironolactone. - Increase Entresto to 49/51 mg bid.  He will call if he has any problems with hypotension/dizziness.  - BMET in 10 days.  - Try to follow 2 g Na diet.  - Down the road AV nodal ablation with BiV pacing would be an option to allow treatment of arrhythmias and intiation of beta blockade though would like to avoid if possible.  - Repeat echo in 2/16. 2. Atrial fibrillation:  Chronic atrial fibrillation with bradycardic ventricular response.  Avoiding beta blocker, antiarrhythmics therefore.  He is on Pradaxa with no bleeding sequelae.  3. Atrial flutter: s/p ablation.  4. Atrial tachycardia: Prior history, mapped to area near AV node.  5. Family history of SCD: Patient may have a channelopathy with atrial fibrillation, bradycardia, PVCs/NSVT, and cardiomyopathy.  Genetic testing has been discussed during his EP visits and he is considering.  I think this would be reasonable given his siblings and young children.  Will defer to Drs Klein/Allred.  6. PVCs/NSVT: As per above discussion,  He has frequent wide complex beats, PVCs versus atrial fibrillation with aberrancy. Bradycardia limits treatment options.  He will see Dr. Johney FrameAllred again in 11/5 => is PVC ablation an option? At this point, I do not think he has a definite ICD indication but I am concerned about his risk for malignant arrhythmias.   Marca AnconaDalton Keltin Baird 01/04/2014

## 2014-02-02 ENCOUNTER — Other Ambulatory Visit (INDEPENDENT_AMBULATORY_CARE_PROVIDER_SITE_OTHER): Payer: Self-pay | Admitting: Surgery

## 2014-02-02 NOTE — H&P (Signed)
Donald Grant. Donald Grant 02/02/2014 10:38 AM Location: [facility] Patient #: [id number] DOB: 07-26-1975 Married / Language: English / Race: White Male History of Present Illness Ardeth Sportsman MD; 02/02/2014 1:03 PM) Patient words: re-check fissure.  The patient is a 38 year old male who presents with anal itching. Pleasant active male with history of cardiac disease. He had perianal wound. He underwent examination under anesthesia. No proof of fistula. Sinus tract removed. Pathology benign. Wound gradually closed down over 6 months. When I saw him last in March, everything had epithelialized over with minimal sensitivity. He comes today 8 months later. He notes the wound never really closed in his mind. Persistent drainage. Some pain. Wished to be reevaluated. Having daily bowel movements. No fevers or chills. No nausea or vomiting. Chronic atrial fibrillation. Placed on anticoagulation since the Spring. Claims she is otherwise active and healthy. No readmissions. Other Problems Rowe Clack, RN, BSN; 02/02/2014 10:38 AM) Alcohol Abuse Anxiety Disorder Atrial Fibrillation Congestive Heart Failure Gastroesophageal Reflux Disease Hypercholesterolemia  Past Surgical History Rowe Clack, RN, BSN; 02/02/2014 10:38 AM) Joesph Fillers Fissure Repair  Diagnostic Studies History Rowe Clack, RN, BSN; 02/02/2014 10:38 AM) Colonoscopy never  Allergies Rowe Clack, RN, BSN; 02/02/2014 10:40 AM) No Known Drug Allergies 02/02/2014  Medication History (Rowe Clack, RN, BSN; 02/02/2014 10:43 AM) Zovirax (400MG  Tablet, Oral as needed) Active. Allopurinol (300MG  Tablet, Oral daily) Active. Lipitor (20MG  Tablet, Oral daily) Active. Colchicine (0.6MG  Capsule, Oral daily) Active. Pradaxa (150MG  Capsule, Oral daily) Active. Fish Oil + D3 (1000-1000MG -UNIT Capsule, Oral daily) Active. Multiple Vitamin (Oral daily) Active. Entresto (49-51MG  Tablet, Oral  daily) Active. Aldactone (25MG  Tablet, Oral daily) Active. Medications Reconciled  Social History Personnel officer, RN, BSN; 02/02/2014 10:38 AM) Alcohol use Heavy alcohol use. Illicit drug use Uses socially only. No caffeine use Tobacco use Former smoker.  Family History (Rowe Clack, RN, BSN; 02/02/2014 10:38 AM) Heart Disease Mother, Sister. Heart disease in male family member before age 2 Hypertension Father.     Review of Systems Glass blower/designer, BSN; 02/02/2014 10:38 AM) General Not Present- Appetite Loss, Chills, Fatigue, Fever, Night Sweats, Weight Gain and Weight Loss. Skin Present- Non-Healing Wounds. Not Present- Change in Wart/Mole, Dryness, Hives, Jaundice, New Lesions, Rash and Ulcer. HEENT Not Present- Earache, Hearing Loss, Hoarseness, Nose Bleed, Oral Ulcers, Ringing in the Ears, Seasonal Allergies, Sinus Pain, Sore Throat, Visual Disturbances, Wears glasses/contact lenses and Yellow Eyes. Respiratory Not Present- Bloody sputum, Chronic Cough, Difficulty Breathing, Snoring and Wheezing. Cardiovascular Present- Palpitations. Not Present- Chest Pain, Difficulty Breathing Lying Down, Leg Cramps, Rapid Heart Rate, Shortness of Breath and Swelling of Extremities. Gastrointestinal Present- Change in Bowel Habits. Not Present- Abdominal Pain, Bloating, Bloody Stool, Chronic diarrhea, Constipation, Difficulty Swallowing, Excessive gas, Gets full quickly at meals, Hemorrhoids, Indigestion, Nausea, Rectal Pain and Vomiting. Male Genitourinary Not Present- Blood in Urine, Change in Urinary Stream, Frequency, Impotence, Nocturia, Painful Urination, Urgency and Urine Leakage. Musculoskeletal Not Present- Back Pain, Joint Pain, Joint Stiffness, Muscle Pain, Muscle Weakness and Swelling of Extremities.  Vitals Glass blower/designer, BSN; 02/02/2014 10:40 AM) 02/02/2014 10:39 AM Weight: 239 lb Height: 74in Body Surface Area: 2.38 m Body Mass Index: 30.69  kg/m Temp.: 98.21F(Oral)  Pulse: 58 (Regular)  Resp.: 18 (Unlabored)  BP: 100/70 (Sitting, Left Arm, Standard)     Physical Exam Ardeth Sportsman MD; 02/02/2014 11:11 AM)  General Mental Status-Alert. General Appearance-Not in acute distress. Voice-Normal.  Integumentary Global Assessment Upon inspection and palpation of skin surfaces of the -  Distribution of scalp and body hair is normal. General Characteristics Overall examination of the patient's skin reveals - no rashes and no suspicious lesions.  Head and Neck Head-normocephalic, atraumatic with no lesions or palpable masses. Face Global Assessment - atraumatic, no absence of expression. Neck Global Assessment - no abnormal movements, no decreased range of motion. Trachea-midline. Thyroid Gland Characteristics - non-tender.  Eye Eyeball - Left-Extraocular movements intact, No Nystagmus. Eyeball - Right-Extraocular movements intact, No Nystagmus. Upper Eyelid - Left-No Cyanotic. Upper Eyelid - Right-No Cyanotic.  Chest and Lung Exam Inspection Accessory muscles - No use of accessory muscles in breathing.  Rectal   Peripheral Vascular Upper Extremity Inspection - Left - Not Gangrenous, No Petechiae. Right - Not Gangrenous, No Petechiae.  Neurologic Neurologic evaluation reveals -normal attention span and ability to concentrate, able to name objects and repeat phrases. Appropriate fund of knowledge and normal coordination.  Neuropsychiatric Mental status exam performed with findings of-able to articulate well with normal speech/language, rate, volume and coherence and no evidence of hallucinations, delusions, obsessions or homicidal/suicidal ideation. Orientation-oriented X3.  Musculoskeletal Global Assessment Gait and Station - normal gait and station.  Lymphatic General Lymphatics Description - No Generalized lymphadenopathy.    Assessment & Plan Ardeth Sportsman(Lamarion Mcevers C. 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    I recommended examination Exam under anesthesia. Superficial fistulotomy versus repair.  He will require cardiac clearance given the fact that he is on chronic anticoagulation now for his atrial fibrillation. He has good exercise tolerance. No new events since last seen earlier this year. Hopefully OK to hold Pradaxa a few days before surgery if Cardiology (Drs Allred/McClean) agree. Restart soon after.  The anatomy & physiology of the anorectal region was discussed. We discussed the pathophysiology of anorectal abscess and fistula. Differential diagnosis was discussed. Natural history progression was discussed. I stressed the importance of a bowel regimen to have daily soft bowel movements to minimize progression of disease.  The patient's condition is not adequately controlled. Non-operative treatment has not healed the fistula. Therefore, I recommended examination under anaesthesia to confirm the diagnosis and treat the fistula. I discussed techniques that may be required such as fistulotomy, ligation by LIFT technique, and/or seton placement. Benefits & alternatives discussed. I noted a good likelihood this will help address the problem, but sometimes repeat operations and prolonged healing times may occur. Risks such as bleeding, pain, recurrence, reoperation, incontinence, heart attack, stroke, death, and other risks were discussed.  Educational handouts further explaining the pathology, treatment options, and bowel regimen were given. The patient expressed understanding & wishes to proceed. We will work to coordinate surgery for a mutually convenient time.  Current Plans Schedule for Surgery Referred to Cardiology, for evaluation and follow up (Cardiology). Refer patient back for follow-up and treatment: suggest medication or treatment. Pt Education - [facility]  Abscess/Fistula (AT) Pt Education - [facility] Good Bowel Health (Almendra Loria) Pt Education - [facility] Rectal Surgery HCI (Milla Wahlberg): discussed with patient and provided information.   Ardeth SportsmanSteven C. Taraann Olthoff, M.D., F.A.C.S. Gastrointestinal and Minimally Invasive Surgery [facility] [address] [place], [place] [id number] [phone] Main / Paging

## 2014-02-09 ENCOUNTER — Telehealth: Payer: Self-pay | Admitting: Internal Medicine

## 2014-02-09 ENCOUNTER — Encounter: Payer: Self-pay | Admitting: Internal Medicine

## 2014-02-09 ENCOUNTER — Ambulatory Visit (INDEPENDENT_AMBULATORY_CARE_PROVIDER_SITE_OTHER): Payer: BC Managed Care – PPO | Admitting: Internal Medicine

## 2014-02-09 VITALS — BP 124/76 | HR 51 | Ht 74.0 in | Wt 237.6 lb

## 2014-02-09 DIAGNOSIS — I481 Persistent atrial fibrillation: Secondary | ICD-10-CM

## 2014-02-09 DIAGNOSIS — F1010 Alcohol abuse, uncomplicated: Secondary | ICD-10-CM

## 2014-02-09 DIAGNOSIS — I429 Cardiomyopathy, unspecified: Secondary | ICD-10-CM

## 2014-02-09 DIAGNOSIS — I10 Essential (primary) hypertension: Secondary | ICD-10-CM

## 2014-02-09 DIAGNOSIS — I4819 Other persistent atrial fibrillation: Secondary | ICD-10-CM

## 2014-02-09 DIAGNOSIS — I493 Ventricular premature depolarization: Secondary | ICD-10-CM

## 2014-02-09 NOTE — Progress Notes (Signed)
PCP:  [name], MD  The patient presents today for routine electrophysiology followup. He remains active.  He has not drank ETOH for > 90 days.  His MRI reveals EF 40% without scar.  He has developed some fatigue and SOB with moderate activity since I saw him last.  Today, he denies symptoms of chest pain, orthopnea, PND, lower extremity edema,  presyncope, syncope, or neurologic sequela.  He has mild dizziness.  The patient feels that he is tolerating medications without difficulties and is otherwise without complaint today.   Past Medical History  Diagnosis Date  . Recurrent upper respiratory infection (URI)   . GERD (gastroesophageal reflux disease)   . Dysrhythmia     atrial flutter  . Dyslipidemia   . Atrial flutter     CTI ablation by Dr [name] 02/24/11  . Atrial tachycardia     mapped to AV node by Dr [name], ablation not performed  . Atrial fibrillation   . Premature ventricular contraction   . First degree AV block   . Gout     bilateral feet  . Lymphadenopathy of right cervical region ?Mono 04/16/2013  . Hx of cardiovascular stress test     Lexiscan Myoview (07/2013):  Inf and inferolateral scar, no ischemia, no gated   Past Surgical History  Procedure Laterality Date  . Root canal    . Mouth surgery    . Atrial ablation surgery  02/24/11    CTI ablation by Dr [name]  . Cardioversion (bedside)  03/29/2011       . Cardioversion  03/29/2011    Procedure: CARDIOVERSION;  Surgeon: [name], MD;  Location: [facility] OR;  Service: Cardiovascular;  Laterality: N/A;  . Examination under anesthesia  12/19/12    Anal Fistula    Current Outpatient Prescriptions  Medication Sig Dispense Refill  . acyclovir (ZOVIRAX) 400 MG tablet Take 400 mg by mouth 2 (two) times daily as needed.     [name] allopurinol (ZYLOPRIM) 300 MG tablet Take 300 mg by mouth daily.    [name] atorvastatin (LIPITOR) 20 MG tablet Take 20 mg by mouth daily.    [name] b complex vitamins capsule Take 1 capsule by mouth  daily.     . cholecalciferol (VITAMIN D) 1000 UNITS tablet Take 1,000 Units by mouth 2 (two) times daily.    . colchicine 0.6 MG tablet Take 0.6 mg by mouth 2 (two) times daily.    . dabigatran (PRADAXA) 150 MG CAPS capsule Take 1 capsule (150 mg total) by mouth 2 (two) times daily. 60 capsule 11  . fish oil-omega-3 fatty acids 1000 MG capsule Take 2 g by mouth daily.      . Multiple Vitamin (MULITIVITAMIN WITH MINERALS) TABS Take 1 tablet by mouth daily.      . Sacubitril-Valsartan (ENTRESTO) 49-51 MG TABS Take 49-51 mg by mouth 2 (two) times daily. 60 tablet 6  . spironolactone (ALDACTONE) 25 MG tablet Take 0.5 tablets (12.5 mg total) by mouth daily. 15 tablet 3   No current facility-administered medications for this visit.    No Known Allergies  History   Social History  . Marital Status: Married    Spouse Name: N/A    Number of Children: N/A  . Years of Education: N/A   Occupational History  . Not on file.   Social History Main Topics  . Smoking status: Former Smoker    Quit date: 08/28/2010  . Smokeless tobacco: Not on file  . Alcohol Use: Yes  Comment: drinks beer 3 to 4 times a week  . Drug Use: Yes    Special: Marijuana     Comment: still currently smoking  . Sexual Activity: Yes   Other Topics Concern  . Not on file   Social History Narrative   His additional social history is notable that the patient exercises     several times a week, jogging up to 2 miles a day.  With exercise, he     notes that he has lost over 10 pounds.     FH- sister has first degree AV block and PVCs,  The patient's mother mother died suddenly in her 5940s.  She apparently had a dilated cardiomyopathy (per pt) and did not have an autopsy obtained.  A maternal aunt died suddenly but per pt had a h/o polysubstance abuse which was felt to be the cause.  She did not have an autopsy.  The patient's maternal grandmother died suddenly  In her 30s and no autopsy was performed per  pt.  Physical Exam: Filed Vitals:   02/09/14 1029  BP: 124/76  Pulse: 51  Height: 6\' 2"  (1.88 m)  Weight: 237 lb 9.6 oz (107.775 kg)    GEN- The patient is well appearing, alert and oriented x 3 today.   Head- normocephalic, atraumatic Eyes-  Sclera clear, conjunctiva pink Ears- hearing intact Oropharynx- clear Neck- supple, no JVP Lungs- Clear to ausculation bilaterally, normal work of breathing Heart- irregular and bradycardic rhythm, no murmurs, rubs or gallops, PMI not laterally displaced GI- soft, NT, ND, + BS Extremities- no clubbing, cyanosis, or edema  ekg today reveals afib slow ventricular rate and frequent PVCs,  septal infarct, nonspecific ST/ T changes  Cardiac MRI reviewed  Assessment and Plan:  1. 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  His sister has a very abnormal ekg also and his mother died suddenly.  I am very suspicious of a channelopathy/ inherited arrhythmia.  Given EF 40%, he does not presently meet criteria for an ICD though I suspect that he is at increased risks of sudden death. I think that ideally an ablation of his afib and PVCs may be beneficial however given his prior sinus bradycardia and marked first degree AV block, I think that his requirment of a pacemaker in sinus would be very high.  Ultimately, he may benefit from CRT if a device is required.  At this time, I would like to acquire an additional opinion regarding his complex conduction system disease and risks of sudden death. I will therefore refer the patient [facility].  No changes are made today  chads2vasc score is now 2.  He is anticoagulated with pradaxa Would avoid AAD therapy at this time.  Chronic bradycardia (even in sinus) limit therapies. ETOH avoidance is again encouraged  2. htn Stable No change required  today  3. PVCs Stable but concerning long term (as above) Given bradycardia, he is not a candidate for beta blockers at this time.  4. Heavy ETOH use He has quite for greater than 90 days  5. Nonischemic CM Now followed in the advanced CHF clinic  Return in 2 months He will contact me if problems arise in the interim

## 2014-02-09 NOTE — Patient Instructions (Signed)
.  Your physician recommends that you schedule a follow-up appointment in: 2 months with Dr. Johney Frame  You have been referred to Dr. [name] at [facility] for Cardiomyopathy

## 2014-02-09 NOTE — Telephone Encounter (Signed)
Received request from Nurse fax box:   To: [facility] Fax number: [phone] Phone number: [phone]

## 2014-02-10 ENCOUNTER — Other Ambulatory Visit (INDEPENDENT_AMBULATORY_CARE_PROVIDER_SITE_OTHER): Payer: Self-pay | Admitting: Surgery

## 2014-02-10 NOTE — H&P (Signed)
Donald BeaversPatrick M. Grant 02/02/2014 10:38 AM Location: [facility] Patient #: [id number] DOB: [birth year] Married / Language: English / Race: White Male  History of Present Illness Donald Grant(Jiayi Lengacher C. Davine Coba MD; 02/02/2014 1:03 PM) Patient words: re-check fissure.  The patient is a 38 year old male who presents with anal itching. Pleasant active male with history of cardiac disease. He had perianal wound. He underwent examination under anesthesia. No proof of fistula. Sinus tract removed. Pathology benign. Wound gradually closed down over 6 months. When I saw him last in March, everything had epithelialized over with minimal sensitivity. He comes today 8 months later. He notes the wound never really closed in his mind. Persistent drainage. Some pain. Wished to be reevaluated. Having daily bowel movements. No fevers or chills. No nausea or vomiting. Chronic atrial fibrillation. Placed on anticoagulation since the Spring. Claims she is otherwise active and healthy. No readmissions.   Other Problems Rowe Clack(Crystal Dollard, RN, BSN; 02/02/2014 10:38 AM) Alcohol Abuse Anxiety Disorder Atrial Fibrillation Congestive Heart Failure Gastroesophageal Reflux Disease Hypercholesterolemia  Past Surgical History Rowe Clack(Crystal Dollard, RN, BSN; 02/02/2014 10:38 AM) Joesph FillersAnal Fissure Repair  Diagnostic Studies History Rowe Clack(Crystal Dollard, RN, BSN; 02/02/2014 10:38 AM) Colonoscopy never  Allergies Rowe Clack(Crystal Dollard, RN, BSN; 02/02/2014 10:40 AM) No Known Drug Allergies11/11/2013  Medication History Rowe Clack(Crystal Dollard, RN, BSN; 02/02/2014 10:43 AM) Zovirax (400MG  Tablet, Oral as needed) Active. Allopurinol (300MG  Tablet, Oral daily) Active. Lipitor (20MG  Tablet, Oral daily) Active. Colchicine (0.6MG  Capsule, Oral daily) Active. Pradaxa (150MG  Capsule, Oral daily) Active. Fish Oil + D3 (1000-1000MG -UNIT Capsule, Oral daily) Active. Multiple Vitamin (Oral daily) Active. Entresto (49-51MG  Tablet, Oral  daily) Active. Aldactone (25MG  Tablet, Oral daily) Active. Medications Reconciled  Social History Personnel officer(Crystal Dollard, RN, BSN; 02/02/2014 10:38 AM) Alcohol use Heavy alcohol use. Illicit drug use Uses socially only. No caffeine use Tobacco use Former smoker.  Family History (Rowe Clackrystal Dollard, RN, BSN; 02/02/2014 10:38 AM) Heart Disease Mother, Sister. Heart disease in male family member before age 39 Hypertension Father.  Review of Systems Glass blower/designer(Crystal Dollard RN, BSN; 02/02/2014 10:38 AM) General Not Present- Appetite Loss, Chills, Fatigue, Fever, Night Sweats, Weight Gain and Weight Loss. Skin Present- Non-Healing Wounds. Not Present- Change in Wart/Mole, Dryness, Hives, Jaundice, New Lesions, Rash and Ulcer. HEENT Not Present- Earache, Hearing Loss, Hoarseness, Nose Bleed, Oral Ulcers, Ringing in the Ears, Seasonal Allergies, Sinus Pain, Sore Throat, Visual Disturbances, Wears glasses/contact lenses and Yellow Eyes. Respiratory Not Present- Bloody sputum, Chronic Cough, Difficulty Breathing, Snoring and Wheezing. Cardiovascular Present- Palpitations. Not Present- Chest Pain, Difficulty Breathing Lying Down, Leg Cramps, Rapid Heart Rate, Shortness of Breath and Swelling of Extremities. Gastrointestinal Present- Change in Bowel Habits. Not Present- Abdominal Pain, Bloating, Bloody Stool, Chronic diarrhea, Constipation, Difficulty Swallowing, Excessive gas, Gets full quickly at meals, Hemorrhoids, Indigestion, Nausea, Rectal Pain and Vomiting. Male Genitourinary Not Present- Blood in Urine, Change in Urinary Stream, Frequency, Impotence, Nocturia, Painful Urination, Urgency and Urine Leakage. Musculoskeletal Not Present- Back Pain, Joint Pain, Joint Stiffness, Muscle Pain, Muscle Weakness and Swelling of Extremities.   Vitals Glass blower/designer(Crystal Dollard RN, BSN; 02/02/2014 10:40 AM) 02/02/2014 10:39 AM Weight: 239 lb Height: 74in Body Surface Area: 2.38 m Body Mass Index: 30.69 kg/m Temp.:  98.24F(Oral)  Pulse: 58 (Regular)  Resp.: 18 (Unlabored)  BP: 100/70 (Sitting, Left Arm, Standard)    Physical Exam Donald Grant(Donald Grant C. Ceclia Koker MD; 02/02/2014 11:11 AM) General Mental Status-Alert. General Appearance-Not in acute distress. Voice-Normal.  Integumentary Global Assessment Upon inspection and palpation of skin surfaces of the - Distribution of  scalp and body hair is normal. General Characteristics Overall examination of the patient's skin reveals - no rashes and no suspicious lesions.  Head and Neck Head-normocephalic, atraumatic with no lesions or palpable masses. Face Global Assessment - atraumatic, no absence of expression. Neck Global Assessment - no abnormal movements, no decreased range of motion. Trachea-midline. Thyroid Gland Characteristics - non-tender.  Eye Eyeball - Left-Extraocular movements intact, No Nystagmus. Eyeball - Right-Extraocular movements intact, No Nystagmus. Upper Eyelid - Left-No Cyanotic. Upper Eyelid - Right-No Cyanotic.  Chest and Lung Exam Inspection Accessory muscles - No use of accessory muscles in breathing.  Rectal   Peripheral Vascular Upper Extremity Inspection - Left - Not Gangrenous, No Petechiae. Right - Not Gangrenous, No Petechiae.  Neurologic Neurologic evaluation reveals -normal attention span and ability to concentrate, able to name objects and repeat phrases. Appropriate fund of knowledge and normal coordination.  Neuropsychiatric Mental status exam performed with findings of-able to articulate well with normal speech/language, rate, volume and coherence and no evidence of hallucinations, delusions, obsessions or homicidal/suicidal ideation. Orientation-oriented X3.  Musculoskeletal Global Assessment Gait and Station - normal gait and station.  Lymphatic General Lymphatics Description - No Generalized lymphadenopathy.    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    I recommended examination Exam under anesthesia. Superficial fistulotomy versus repair.  He will require cardiac clearance given the fact that he is on chronic anticoagulation now for his atrial fibrillation. He has good exercise tolerance. No new events since last seen earlier this year. Hopefully OK to hold Pradaxa a few days before surgery if Cardiology (Drs Allred/McClean) agree. Restart soon after.  The anatomy & physiology of the anorectal region was discussed. We discussed the pathophysiology of anorectal abscess and fistula. Differential diagnosis was discussed. Natural history progression was discussed. I stressed the importance of a bowel regimen to have daily soft bowel movements to minimize progression of disease.  The patient's condition is not adequately controlled. Non-operative treatment has not healed the fistula. Therefore, I recommended examination under anaesthesia to confirm the diagnosis and treat the fistula. I discussed techniques that may be required such as fistulotomy, ligation by LIFT technique, and/or seton placement. Benefits & alternatives discussed. I noted a good likelihood this will help address the problem, but sometimes repeat operations and prolonged healing times may occur. Risks such as bleeding, pain, recurrence, reoperation, incontinence, heart attack, stroke, death, and other risks were discussed.  Educational handouts further explaining the pathology, treatment options, and bowel regimen were given. The patient expressed understanding & wishes to proceed. We will work to coordinate surgery for a mutually convenient time. Current Plans  Schedule for Surgery Referred to Cardiology, for evaluation and follow up (Cardiology). Refer patient back for follow-up and treatment: suggest medication or treatment. Pt Education - [facility] Abscess/Fistula  (AT) Pt Education - [facility] Good Bowel Health (Tyleigh Mahn) Pt Education - [facility] Rectal Surgery HCI (Syanne Looney): discussed with patient and provided information.   Signed by Donald Sportsman, MD (02/02/2014 1:07 PM)

## 2014-02-24 DIAGNOSIS — I440 Atrioventricular block, first degree: Secondary | ICD-10-CM | POA: Insufficient documentation

## 2014-02-24 DIAGNOSIS — I493 Ventricular premature depolarization: Secondary | ICD-10-CM | POA: Insufficient documentation

## 2014-02-24 DIAGNOSIS — I471 Supraventricular tachycardia: Secondary | ICD-10-CM | POA: Insufficient documentation

## 2014-02-24 DIAGNOSIS — Z8489 Family history of other specified conditions: Secondary | ICD-10-CM | POA: Insufficient documentation

## 2014-02-24 DIAGNOSIS — I483 Typical atrial flutter: Secondary | ICD-10-CM | POA: Insufficient documentation

## 2014-03-04 ENCOUNTER — Encounter (HOSPITAL_COMMUNITY): Payer: Self-pay | Admitting: Internal Medicine

## 2014-03-04 ENCOUNTER — Encounter (HOSPITAL_COMMUNITY): Payer: BC Managed Care – PPO

## 2014-03-09 ENCOUNTER — Telehealth (HOSPITAL_COMMUNITY): Payer: Self-pay | Admitting: Vascular Surgery

## 2014-03-09 NOTE — Telephone Encounter (Signed)
Dr. Maple Hudson office called pt may have some dental surgery and they want to know if he needs to be pre medicated or clearance due to his HF.[name] PLEASE ADVISE

## 2014-03-10 NOTE — Telephone Encounter (Signed)
No premedication necessary

## 2014-03-10 NOTE — Telephone Encounter (Signed)
No hx of valve replacement, will send to Dr Shirlee Latch to review

## 2014-03-11 DIAGNOSIS — I428 Other cardiomyopathies: Secondary | ICD-10-CM | POA: Insufficient documentation

## 2014-03-11 NOTE — Telephone Encounter (Signed)
Kim is aware

## 2014-03-17 ENCOUNTER — Other Ambulatory Visit (HOSPITAL_COMMUNITY): Payer: Self-pay | Admitting: Cardiology

## 2014-03-17 DIAGNOSIS — I5022 Chronic systolic (congestive) heart failure: Secondary | ICD-10-CM

## 2014-03-26 ENCOUNTER — Encounter (HOSPITAL_COMMUNITY): Payer: BC Managed Care – PPO

## 2014-04-07 ENCOUNTER — Other Ambulatory Visit (HOSPITAL_COMMUNITY): Payer: Self-pay | Admitting: Cardiology

## 2014-04-17 ENCOUNTER — Ambulatory Visit: Payer: BC Managed Care – PPO | Admitting: Internal Medicine

## 2014-05-01 ENCOUNTER — Emergency Department (HOSPITAL_COMMUNITY)
Admission: EM | Admit: 2014-05-01 | Discharge: 2014-05-01 | Disposition: A | Discharge status: Home / Self Care | Payer: BLUE CROSS/BLUE SHIELD | Attending: Emergency Medicine | Admitting: Emergency Medicine

## 2014-05-01 ENCOUNTER — Encounter (HOSPITAL_COMMUNITY): Payer: Self-pay | Admitting: Emergency Medicine

## 2014-05-01 ENCOUNTER — Emergency Department (HOSPITAL_COMMUNITY): Payer: BLUE CROSS/BLUE SHIELD

## 2014-05-01 DIAGNOSIS — Z79899 Other long term (current) drug therapy: Secondary | ICD-10-CM | POA: Diagnosis not present

## 2014-05-01 DIAGNOSIS — I4891 Unspecified atrial fibrillation: Secondary | ICD-10-CM | POA: Insufficient documentation

## 2014-05-01 DIAGNOSIS — R079 Chest pain, unspecified: Secondary | ICD-10-CM | POA: Insufficient documentation

## 2014-05-01 DIAGNOSIS — Z87891 Personal history of nicotine dependence: Secondary | ICD-10-CM | POA: Diagnosis not present

## 2014-05-01 DIAGNOSIS — I4892 Unspecified atrial flutter: Secondary | ICD-10-CM | POA: Diagnosis not present

## 2014-05-01 DIAGNOSIS — K219 Gastro-esophageal reflux disease without esophagitis: Secondary | ICD-10-CM | POA: Insufficient documentation

## 2014-05-01 DIAGNOSIS — Z9889 Other specified postprocedural states: Secondary | ICD-10-CM | POA: Insufficient documentation

## 2014-05-01 DIAGNOSIS — E785 Hyperlipidemia, unspecified: Secondary | ICD-10-CM | POA: Diagnosis not present

## 2014-05-01 DIAGNOSIS — R42 Dizziness and giddiness: Secondary | ICD-10-CM | POA: Insufficient documentation

## 2014-05-01 DIAGNOSIS — M109 Gout, unspecified: Secondary | ICD-10-CM | POA: Diagnosis not present

## 2014-05-01 DIAGNOSIS — Z8709 Personal history of other diseases of the respiratory system: Secondary | ICD-10-CM | POA: Insufficient documentation

## 2014-05-01 DIAGNOSIS — Z87898 Personal history of other specified conditions: Secondary | ICD-10-CM

## 2014-05-01 LAB — CBC
HCT: 44.3 % (ref 39.0–52.0)
Hemoglobin: 15.7 g/dL (ref 13.0–17.0)
MCH: 31.7 pg (ref 26.0–34.0)
MCHC: 35.4 g/dL (ref 30.0–36.0)
MCV: 89.5 fL (ref 78.0–100.0)
Platelets: 220 10*3/uL (ref 150–400)
RBC: 4.95 MIL/uL (ref 4.22–5.81)
RDW: 12.8 % (ref 11.5–15.5)
WBC: 8.7 10*3/uL (ref 4.0–10.5)

## 2014-05-01 LAB — BASIC METABOLIC PANEL
Anion gap: 7 (ref 5–15)
BUN: 15 mg/dL (ref 6–23)
CHLORIDE: 102 mmol/L (ref 96–112)
CO2: 30 mmol/L (ref 19–32)
CREATININE: 0.69 mg/dL (ref 0.50–1.35)
Calcium: 9.5 mg/dL (ref 8.4–10.5)
GFR calc Af Amer: >90 mL/min (ref >90)
GFR calc non Af Amer: >90 mL/min (ref >90)
Glucose, Bld: 83 mg/dL (ref 70–99)
Potassium: 3.7 mmol/L (ref 3.5–5.1)
Sodium: 139 mmol/L (ref 135–145)

## 2014-05-01 LAB — PROTIME-INR
INR: 1.07 (ref 0.00–1.49)
Prothrombin Time: 14.1 seconds (ref 11.6–15.2)

## 2014-05-01 LAB — I-STAT TROPONIN, ED: Troponin i, poc: 0.02 ng/mL (ref 0.00–0.08)

## 2014-05-01 NOTE — ED Notes (Addendum)
EKG given to EDP,Pfeiffer,MD., for review. 

## 2014-05-01 NOTE — ED Notes (Signed)
Pt states that he had an ablation on 1/26 and today at 1801 had a sudden onset of central chest pain that radiated to his abdominal area. Pt states that he was SOB and dizziness earlier in the day. Pt states that his pain has subsided at this time.

## 2014-05-01 NOTE — ED Notes (Signed)
Patient transported to X-ray 

## 2014-05-01 NOTE — ED Provider Notes (Signed)
CSN: [id number]     Arrival date & time 05/01/14  1938 History   First MD Initiated Contact with Patient 05/01/14 2000     Chief Complaint  Patient presents with  . Chest Pain     (Consider location/radiation/quality/duration/timing/severity/associated sxs/prior Treatment) HPI Comments: Patient is a 39 year old male with a past medical history of atrial flutter/atrial fibrillation who is 2 weeks s/p cardic ablation who presents after an episode of chest pain that occurred earlier today. Patient reports chest pain in his central chest that radiated to his epigastric area. The pain was sharp and severe and lasted a few seconds and spontaneously resolved. No associated symptoms with the chest pain. He reports brief SOB and lightheadedness that occurred earlier in the day which was separate from his chest pain. Patient is symptom free at this time. He is concerned due to his history and recent ablation. No aggravating/alleviating factors.    Past Medical History  Diagnosis Date  . Recurrent upper respiratory infection (URI)   . GERD (gastroesophageal reflux disease)   . Dysrhythmia     atrial flutter  . Dyslipidemia   . Atrial flutter     CTI ablation by Dr [name] 02/24/11  . Atrial tachycardia     mapped to AV node by Dr [name], ablation not performed  . Atrial fibrillation   . Premature ventricular contraction   . First degree AV block   . Gout     bilateral feet  . Lymphadenopathy of right cervical region ?Mono 04/16/2013  . Hx of cardiovascular stress test     Lexiscan Myoview (07/2013):  Inf and inferolateral scar, no ischemia, no gated   Past Surgical History  Procedure Laterality Date  . Root canal    . Mouth surgery    . Atrial ablation surgery  02/24/11    CTI ablation by Dr [name]  . Cardioversion (bedside)  03/29/2011       . Cardioversion  03/29/2011    Procedure: CARDIOVERSION;  Surgeon: [name], MD;  Location: [facility] OR;  Service: Cardiovascular;  Laterality: N/A;   . Examination under anesthesia  12/19/12    Anal Fistula  . Atrial flutter ablation N/A 02/24/2011    Procedure: ATRIAL FLUTTER ABLATION;  Surgeon: [name], MD;  Location: [facility] CATH LAB;  Service: Cardiovascular;  Laterality: N/A;   Family History  Problem Relation Age of Onset  . Heart disease Mother   . Achondroplasia Mother   . Alcohol abuse Mother   . Achondroplasia Son    History  Substance Use Topics  . Smoking status: Former Smoker    Quit date: 08/28/2010  . Smokeless tobacco: Never Used  . Alcohol Use: No     Comment: quit 6 months ago    Review of Systems  Cardiovascular: Positive for chest pain.  Neurological: Positive for light-headedness.  All other systems reviewed and are negative.     Allergies  Review of patient's allergies indicates no known allergies.  Home Medications   Prior to Admission medications   Medication Sig Start Date End Date Taking? Authorizing Provider  acyclovir (ZOVIRAX) 400 MG tablet Take 400 mg by mouth 2 (two) times daily as needed.  09/12/12  Yes Historical Provider, MD  allopurinol (ZYLOPRIM) 300 MG tablet Take 300 mg by mouth every morning.    Yes Historical Provider, MD  atorvastatin (LIPITOR) 20 MG tablet Take 20 mg by mouth daily.   Yes Historical Provider, MD  b complex vitamins capsule Take  1 capsule by mouth daily.    Yes Historical Provider, MD  cholecalciferol (VITAMIN D) 1000 UNITS tablet Take 1,000 Units by mouth 2 (two) times daily.   Yes Historical Provider, MD  dabigatran (PRADAXA) 150 MG CAPS capsule Take 1 capsule (150 mg total) by mouth 2 (two) times daily. 07/29/13  Yes Beatrice Lecher, PA-C  fish oil-omega-3 fatty acids 1000 MG capsule Take 2 g by mouth daily.     Yes Historical Provider, MD  Multiple Vitamin (MULITIVITAMIN WITH MINERALS) TABS Take 1 tablet by mouth daily.     Yes Historical Provider, MD  Sacubitril-Valsartan (ENTRESTO) 49-51 MG TABS Take 49-51 mg by mouth 2 (two) times daily. 01/02/14  Yes Laurey Morale, MD  spironolactone (ALDACTONE) 25 MG tablet TAKE 1/2 TABLET BY MOUTH ONCE DAILY 03/17/14  Yes Dolores Patty, MD  spironolactone (ALDACTONE) 25 MG tablet TAKE 1/2 TABLET BY MOUTH ONCE DAILY 04/07/14  Yes Bevelyn Buckles Bensimhon, MD   Ht  (1.88 m)  Wt 243 lb (110.224 kg)  BMI 31.19 kg/m2 Physical Exam  Constitutional: He is oriented to person, place, and time. He appears well-developed and well-nourished. No distress.  HENT:  Head: Normocephalic and atraumatic.  Eyes: Conjunctivae and EOM are normal.  Neck: Normal range of motion.  Cardiovascular: Normal rate and regular rhythm.  Exam reveals no gallop and no friction rub.   No murmur heard. Pulmonary/Chest: Effort normal and breath sounds normal. He has no wheezes. He has no rales. He exhibits no tenderness.  Abdominal: Soft. He exhibits no distension. There is no tenderness. There is no rebound.  Musculoskeletal: Normal range of motion.  Neurological: He is alert and oriented to person, place, and time. Coordination normal.  Speech is goal-oriented. Moves limbs without ataxia.   Skin: Skin is warm and dry.  Psychiatric: He has a normal mood and affect. His behavior is normal.  Nursing note and vitals reviewed.   ED Course  Procedures (including critical care time) Labs Review Labs Reviewed  CBC  BASIC METABOLIC PANEL  BRAIN NATRIURETIC PEPTIDE  PROTIME-INR  I-STAT TROPOININ, ED    Imaging Review Dg Chest 2 View  05/01/2014   CLINICAL DATA:  Initial encounter for Chest pain with radiation into abdomen. Recent atrial ablation. Shortness of breath. Ex-smoker.  EXAM: CHEST  2 VIEW  COMPARISON:  06/16/2013  FINDINGS: Numerous leads and wires project over the chest. Midline trachea. Borderline cardiomegaly. Mediastinal contours otherwise within normal limits. No pleural effusion or pneumothorax. No congestive failure. Clear lungs.  IMPRESSION: No acute cardiopulmonary disease.   Electronically Signed   By: Jeronimo Greaves  M.D.   On: 05/01/2014 21:07     EKG Interpretation   Date/Time:  Friday May 01 2014 20:00:33 EST Ventricular Rate:  59 PR Interval:    QRS Duration: 117 QT Interval:  433 QTC Calculation: 429 R Axis:   -23 Text Interpretation:  Atrial fibrillation Nonspecific intraventricular  conduction delay Low voltage, precordial leads Borderline repolarization  abnormality since last tracing no significant change Confirmed by Effie Shy   MD, Mechele Collin ([id number]) on 05/01/2014 10:10:12 PM      MDM   Final diagnoses:  H/O chest pain    11:15 PM Patient's labs unremarkable for acute changes. EKG unremarkable. Chest xray unremarkable. Vitals stable and patient afebrile. Patient asymptomatic at this time.    Emilia Beck, PA-C 05/03/14 0046  Flint Melter, MD 05/03/14 [phone]

## 2014-05-02 LAB — BRAIN NATRIURETIC PEPTIDE: B Natriuretic Peptide: 38.8 pg/mL (ref 0.0–100.0)

## 2014-05-04 ENCOUNTER — Other Ambulatory Visit (HOSPITAL_COMMUNITY): Payer: Self-pay | Admitting: Cardiology

## 2014-05-06 ENCOUNTER — Encounter: Payer: Self-pay | Admitting: Internal Medicine

## 2014-05-06 ENCOUNTER — Ambulatory Visit (INDEPENDENT_AMBULATORY_CARE_PROVIDER_SITE_OTHER): Payer: BLUE CROSS/BLUE SHIELD | Admitting: Internal Medicine

## 2014-05-06 VITALS — BP 130/70 | HR 59 | Ht 74.0 in | Wt 247.0 lb

## 2014-05-06 DIAGNOSIS — I429 Cardiomyopathy, unspecified: Secondary | ICD-10-CM

## 2014-05-06 DIAGNOSIS — I482 Chronic atrial fibrillation, unspecified: Secondary | ICD-10-CM

## 2014-05-06 DIAGNOSIS — F1010 Alcohol abuse, uncomplicated: Secondary | ICD-10-CM

## 2014-05-06 DIAGNOSIS — I493 Ventricular premature depolarization: Secondary | ICD-10-CM

## 2014-05-06 NOTE — Patient Instructions (Signed)
Your physician recommends that you schedule a follow-up appointment in: 4 weeks with Dr Allred.  

## 2014-05-07 ENCOUNTER — Encounter: Payer: Self-pay | Admitting: Internal Medicine

## 2014-05-07 NOTE — Progress Notes (Signed)
Date:  05/07/2014   ID:  Donald Grant, DOB [birth year], MRN [id number]  PCP:  [name], MD  Primary Electrophysiologist: [name], MD    Chief Complaint  Patient presents with  . Follow-up    Cardiomyopathy & Persistent AFIB     History of Present Illness: Donald Grant is a 39 y.o. male who presents today for electrophysiology evaluation.   He returns for follow-up after PVI at [facility] 04/24/14.  He is recovering nicely from the procedure.  He has had several episodes of sharp epigastric pain which were short lived.  He denies odynophagia, fevers, chills, GI bleeding, hemoptysis, or other concerns.  He did return to afib shortly after his procedure and feels that he has been persistently in afib since.  He did feel better with sinus rhythm post ablation.  Today, he denies symptoms of palpitations, chest pain, shortness of breath, orthopnea, PND, lower extremity edema, claudication, dizziness, presyncope, syncope, bleeding, or neurologic sequela. The patient is tolerating medications without difficulties and is otherwise without complaint today.    Past Medical History  Diagnosis Date  . Recurrent upper respiratory infection (URI)   . GERD (gastroesophageal reflux disease)   . Dysrhythmia     atrial flutter  . Dyslipidemia   . Atrial flutter     CTI ablation by Dr [name] 02/24/11  . Atrial tachycardia     mapped to AV node by Dr [name], ablation not performed  . Atrial fibrillation   . Premature ventricular contraction   . First degree AV block   . Gout     bilateral feet  . Lymphadenopathy of right cervical region ?Mono 04/16/2013  . Hx of cardiovascular stress test     Lexiscan Myoview (07/2013):  Inf and inferolateral scar, no ischemia, no gated   Past Surgical History  Procedure Laterality Date  . Root canal    . Mouth surgery    . Atrial ablation surgery  02/24/11    CTI ablation by Dr [name]  . Cardioversion (bedside)  03/29/2011       .  Cardioversion  03/29/2011    Procedure: CARDIOVERSION;  Surgeon: [name], MD;  Location: [facility] OR;  Service: Cardiovascular;  Laterality: N/A;  . Examination under anesthesia  12/19/12    Anal Fistula  . Atrial flutter ablation N/A 02/24/2011    Procedure: ATRIAL FLUTTER ABLATION;  Surgeon: [name], MD;  Location: [facility] CATH LAB;  Service: Cardiovascular;  Laterality: N/A;  . Atrial fibrillation ablation  04/24/14    PVI at [facility] by Dr [name]     Current Outpatient Prescriptions  Medication Sig Dispense Refill  . acyclovir (ZOVIRAX) 400 MG tablet Take 400 mg by mouth 2 (two) times daily as needed (outbreak).     [name] allopurinol (ZYLOPRIM) 300 MG tablet Take 300 mg by mouth every morning.     [name] ALPRAZolam (XANAX) 0.5 MG tablet Take 0.5 tablets by mouth daily as needed. anxiety  1  . atorvastatin (LIPITOR) 20 MG tablet Take 20 mg by mouth daily.    [name] b complex vitamins capsule Take 1 capsule by mouth daily.     . cholecalciferol (VITAMIN D) 1000 UNITS tablet Take 1,000 Units by mouth 2 (two) times daily.    . dabigatran (PRADAXA) 150 MG CAPS capsule Take 1 capsule (150 mg total) by mouth 2 (two) times daily. 60 capsule 11  . fish oil-omega-3 fatty acids 1000 MG capsule Take 2 g by  mouth daily.      . indomethacin (INDOCIN) 25 MG capsule Take 25 mg by mouth 3 (three) times daily as needed. gout    . Multiple Vitamin (MULITIVITAMIN WITH MINERALS) TABS Take 1 tablet by mouth daily.      [name] omeprazole (PRILOSEC) 20 MG capsule Take 1 capsule by mouth 2 (two) times daily.    . Sacubitril-Valsartan (ENTRESTO) 49-51 MG TABS Take 49-51 mg by mouth 2 (two) times daily. 60 tablet 6  . spironolactone (ALDACTONE) 25 MG tablet TAKE 1/2 TABLET BY MOUTH ONCE DAILY 15 tablet 3   No current facility-administered medications for this visit.    Allergies:   Review of patient's allergies indicates no known allergies.   Social History:  The patient  reports that he quit smoking about 3 years ago. He has  never used smokeless tobacco. He reports that he uses illicit drugs (Marijuana). He reports that he does not drink alcohol.   Family History:  The patient's  family history includes Achondroplasia in his mother and son; Alcohol abuse in his mother; Heart disease in his mother.    ROS:  Please see the history of present illness.   All other systems are reviewed and negative.    PHYSICAL EXAM: VS:  BP 130/70 mmHg  Pulse 59  Ht 6\' 2"  (1.88 m)  Wt 247 lb (112.038 kg)  BMI 31.70 kg/m2 , BMI Body mass index is 31.7 kg/(m^2). GEN: Well nourished, well developed, in no acute distress HEENT: normal Neck: no JVD, carotid bruits, or masses Cardiac: iRRR  Respiratory:  clear to auscultation bilaterally, normal work of breathing GI: soft, nontender, nondistended, + BS MS: no deformity or atrophy Skin: warm and dry  Neuro:  Strength and sensation are intact Psych: euthymic mood, full affect  EKG:  EKG is ordered today. The ekg ordered today shows afib, V rate 59 bpm, PVCs, septal infarct   Recent Labs: 06/16/2013: ALT 34 08/05/2013: Magnesium 1.8 12/19/2013: Pro B Natriuretic peptide (BNP) 699.90* 05/01/2014: B Natriuretic Peptide 38.8; BUN 15; Creatinine 0.69; Hemoglobin 15.7; Platelets 220; Potassium 3.7; Sodium 139    Lipid Panel  No results found for: CHOL, TRIG, HDL, CHOLHDL, VLDL, LDLCALC, LDLDIRECT   Wt Readings from Last 3 Encounters:  05/06/14 247 lb (112.038 kg)  05/01/14 243 lb (110.224 kg)  02/09/14 237 lb 9.6 oz (107.775 kg)      ASSESSMENT AND PLAN:  1.  Persistent atrial fibrillation He has had ERAF post ablation but is clinically stable Continue anticoagulation He has scheduled follow-up next week at [facility].  If he remains in afib, I would anticipate cardioversion at that time. Given first degree AV block and bradycardia, our AAD options would be limited.  I would like consider tikosyn if he does not maintain sinus rhythm while recovering over the next 3 months post  ablation.  2. Nonischemic CM euvolemic today Hopefully, his EF will recover if we can achieve/ maintain sinus  3. First degree AV block/ sick sinus syndrome No indication for pacing presently  4. ETOH He has reduced his drinking substantially    Current medicines are reviewed at length with the patient today.   The patient does not have concerns regarding his medicines.  The following changes were made today:  none  Follow-up with [facility] next week.  Return to the AF clinic in 4 weeks   Signed, [name], MD  05/07/2014 9:23 PM     [facility] [address] [place]  [address] [phone] (office) [phone] (fax)

## 2014-05-26 DIAGNOSIS — R898 Other abnormal findings in specimens from other organs, systems and tissues: Secondary | ICD-10-CM | POA: Insufficient documentation

## 2014-06-08 ENCOUNTER — Ambulatory Visit (INDEPENDENT_AMBULATORY_CARE_PROVIDER_SITE_OTHER): Payer: BLUE CROSS/BLUE SHIELD | Admitting: Internal Medicine

## 2014-06-08 ENCOUNTER — Other Ambulatory Visit: Payer: Self-pay

## 2014-06-08 ENCOUNTER — Encounter: Payer: Self-pay | Admitting: Internal Medicine

## 2014-06-08 VITALS — BP 130/82 | HR 56 | Ht 73.5 in | Wt 244.4 lb

## 2014-06-08 DIAGNOSIS — I482 Chronic atrial fibrillation, unspecified: Secondary | ICD-10-CM

## 2014-06-08 DIAGNOSIS — I429 Cardiomyopathy, unspecified: Secondary | ICD-10-CM

## 2014-06-08 DIAGNOSIS — I10 Essential (primary) hypertension: Secondary | ICD-10-CM

## 2014-06-08 DIAGNOSIS — F1010 Alcohol abuse, uncomplicated: Secondary | ICD-10-CM

## 2014-06-08 NOTE — Patient Instructions (Signed)
Your physician recommends that you schedule a follow-up appointment in 3 months with Dr Allred    

## 2014-06-09 NOTE — Progress Notes (Signed)
Date:  06/09/2014   ID:  Donald Grant, DOB Apr 29, 1975, MRN [id number]  PCP:  [name], MD  Primary Electrophysiologist: [name], MD    Chief Complaint  Patient presents with  . Follow-up    Chronic AFIB     History of Present Illness: Donald Grant is a 39 y.o. male who presents today for electrophysiology evaluation.   He returns for follow-up after PVI at [facility] 04/24/14 and subsequent cardioversion 2/16.   Unfortunately, he is back in afib today.  He thinks that he may have been in sinus for only 2-3days.  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 He is scheduled to have a BiV ICD in April.  Today, he denies symptoms of palpitations, chest pain, shortness of breath, orthopnea, PND, lower extremity edema, claudication, dizziness, presyncope, syncope, bleeding, or neurologic sequela. The patient is tolerating medications without difficulties and is otherwise without complaint today.    Past Medical History  Diagnosis Date  . Recurrent upper respiratory infection (URI)   . GERD (gastroesophageal reflux disease)   . Dysrhythmia     atrial flutter  . Dyslipidemia   . Atrial flutter     CTI ablation by Dr [name] 02/24/11  . Atrial tachycardia     mapped to AV node by Dr [name], ablation not performed  . Atrial fibrillation   . Premature ventricular contraction   . First degree AV block   . Gout     bilateral feet  . Lymphadenopathy of right cervical region ?Mono 04/16/2013  . Hx of cardiovascular stress test     Lexiscan Myoview (07/2013):  Inf and inferolateral scar, no ischemia, no gated   Past Surgical History  Procedure Laterality Date  . Root canal    . Mouth surgery    . Atrial ablation surgery  02/24/11    CTI ablation by Dr [name]  . Cardioversion (bedside)  03/29/2011       . Cardioversion  03/29/2011    Procedure: CARDIOVERSION;  Surgeon: [name], MD;   Location: [facility] OR;  Service: Cardiovascular;  Laterality: N/A;  . Examination under anesthesia  12/19/12    Anal Fistula  . Atrial flutter ablation N/A 02/24/2011    Procedure: ATRIAL FLUTTER ABLATION;  Surgeon: [name], MD;  Location: [facility] CATH LAB;  Service: Cardiovascular;  Laterality: N/A;  . Atrial fibrillation ablation  04/24/14    PVI at [facility] by Dr [name]     Current Outpatient Prescriptions  Medication Sig Dispense Refill  . acyclovir (ZOVIRAX) 400 MG tablet Take 400 mg by mouth 2 (two) times daily as needed (outbreak).     [name] allopurinol (ZYLOPRIM) 300 MG tablet Take 300 mg by mouth every morning.     [name] ALPRAZolam (XANAX) 0.5 MG tablet Take 0.5 tablets by mouth daily as needed. anxiety  1  . atorvastatin (LIPITOR) 20 MG tablet Take 20 mg by mouth daily.    [name] b complex vitamins capsule Take 1 capsule by mouth daily.     . cholecalciferol (VITAMIN D) 1000 UNITS tablet Take 1,000 Units by mouth 2 (two) times daily.    . dabigatran (PRADAXA) 150 MG CAPS capsule Take 1 capsule (150 mg total) by mouth 2 (two) times daily. 60 capsule 11  . indomethacin (INDOCIN) 25 MG capsule Take 25 mg by mouth 3 (three) times daily as needed. gout    .  magnesium oxide (MAG-OX) 400 MG tablet Take 1 tablet by mouth daily.    . Multiple Vitamin (MULITIVITAMIN WITH MINERALS) TABS Take 1 tablet by mouth daily.      . Sacubitril-Valsartan (ENTRESTO) 49-51 MG TABS Take 49-51 mg by mouth 2 (two) times daily. (Patient taking differently: Take 1 tablet by mouth 2 (two) times daily. ) 60 tablet 6  . spironolactone (ALDACTONE) 25 MG tablet TAKE 1/2 TABLET BY MOUTH ONCE DAILY 15 tablet 3  . fish oil-omega-3 fatty acids 1000 MG capsule Take 2 g by mouth daily.       No current facility-administered medications for this visit.    Allergies:   Review of patient's allergies indicates no known allergies.   Social History:  The patient  reports that he quit smoking about 3 years ago. He has never used smokeless  tobacco. He reports that he uses illicit drugs (Marijuana). He reports that he does not drink alcohol.   Family History:  The patient's  family history includes Achondroplasia in his mother and son; Alcohol abuse in his mother; Heart disease in his mother.    ROS:  Please see the history of present illness.   All other systems are reviewed and negative.    PHYSICAL EXAM: VS:  BP 130/82 mmHg  Pulse 56  Ht 6' 1.5" (1.867 m)  Wt 244 lb 6.4 oz (110.859 kg)  BMI 31.80 kg/m2 , BMI Body mass index is 31.8 kg/(m^2). GEN: Well nourished, well developed, in no acute distress HEENT: normal Neck: no JVD, carotid bruits, or masses Cardiac: iRRR  Respiratory:  clear to auscultation bilaterally, normal work of breathing GI: soft, nontender, nondistended, + BS MS: no deformity or atrophy Skin: warm and dry  Neuro:  Strength and sensation are intact Psych: euthymic mood, full affect  EKG:  EKG is ordered today. The ekg ordered today shows afib with controlled V response   Recent Labs: 06/16/2013: ALT 34 08/05/2013: Magnesium 1.8 12/19/2013: Pro B Natriuretic peptide (BNP) 699.90* 05/01/2014: B Natriuretic Peptide 38.8; BUN 15; Creatinine 0.69; Hemoglobin 15.7; Platelets 220; Potassium 3.7; Sodium 139    Lipid Panel  No results found for: CHOL, TRIG, HDL, CHOLHDL, VLDL, LDLCALC, LDLDIRECT   Wt Readings from Last 3 Encounters:  06/08/14 244 lb 6.4 oz (110.859 kg)  05/06/14 247 lb (112.038 kg)  05/01/14 243 lb (110.224 kg)      ASSESSMENT AND PLAN:  1.  Persistent atrial fibrillation He has had ERAF post ablation but is clinically stable.  Given sinus bradycardia and AV block, I am reluctant to consider AAD therapy. Continue anticoagulation He has scheduled follow-up next week at [facility]. To discuss BiV ICD.  Once the device is in place, I think that we could more safely start AADs at that time.  2. Nonischemic CM euvolemic today Genetic mutation discovered. I have encouraged genetic  counseling. BiV ICD plans at [facility] are noted.  3. First degree AV block/ sick sinus syndrome Would likely benefit from BiV device at time of implant.  4. ETOH He has reduced his drinking substantially    Current medicines are reviewed at length with the patient today.   The patient does not have concerns regarding his medicines.  The following changes were made today:  none  Return to see me in 2 months  This is a very complex patient with multiple uncontrolled medical issues.  He is at high risk of decompensation/ hospitalization.  A high level of decision making is required for the visit  today.   Signed, [name], MD  06/09/2014 11:14 PM     [facility] [address] [place] [address] [phone] (office) [phone] (fax)

## 2014-07-16 ENCOUNTER — Other Ambulatory Visit: Payer: Self-pay | Admitting: Physician Assistant

## 2014-08-01 ENCOUNTER — Other Ambulatory Visit (HOSPITAL_COMMUNITY): Payer: Self-pay | Admitting: Cardiology

## 2014-08-25 ENCOUNTER — Other Ambulatory Visit: Payer: Self-pay

## 2014-08-25 ENCOUNTER — Encounter: Payer: Self-pay | Admitting: *Deleted

## 2014-08-27 ENCOUNTER — Ambulatory Visit: Payer: BLUE CROSS/BLUE SHIELD | Admitting: Internal Medicine

## 2014-08-28 ENCOUNTER — Ambulatory Visit (INDEPENDENT_AMBULATORY_CARE_PROVIDER_SITE_OTHER): Payer: BLUE CROSS/BLUE SHIELD | Admitting: Internal Medicine

## 2014-08-28 ENCOUNTER — Other Ambulatory Visit: Payer: Self-pay

## 2014-08-28 ENCOUNTER — Encounter: Payer: Self-pay | Admitting: Internal Medicine

## 2014-08-28 VITALS — BP 108/70 | HR 65 | Ht 74.0 in | Wt 243.8 lb

## 2014-08-28 DIAGNOSIS — I482 Chronic atrial fibrillation, unspecified: Secondary | ICD-10-CM

## 2014-08-28 DIAGNOSIS — I429 Cardiomyopathy, unspecified: Secondary | ICD-10-CM | POA: Diagnosis not present

## 2014-08-28 DIAGNOSIS — I10 Essential (primary) hypertension: Secondary | ICD-10-CM | POA: Diagnosis not present

## 2014-08-28 NOTE — Progress Notes (Signed)
Date:  08/28/2014   ID:  Donald Grant, DOB June 06, 1975, MRN [id number]  PCP:  [name], MD  Primary Electrophysiologist: [name], MD    Chief Complaint  Patient presents with  . Chronic AFIB     History of Present Illness: Donald Grant is a 39 y.o. male who presents today for electrophysiology evaluation.   He returns for follow-up after PVI at [facility] 04/24/14 and subsequent cardioversion 2/16.  He underwent repeat PVI 08/13/14 with PVI, posterior box isolation, and FRIM (Topera) performed at [facility]   Unfortunately, he is back in afib today.  He is feeling ok.  He is scheduled to have a BiV ICD June 28th.  His EF has improved with medical therapy by MRI.  Today, he denies symptoms of palpitations, chest pain, shortness of breath, orthopnea, PND, lower extremity edema, claudication, dizziness, presyncope, syncope, bleeding, or neurologic sequela. The patient is tolerating medications without difficulties and is otherwise without complaint today.    Past Medical History  Diagnosis Date  . Recurrent upper respiratory infection (URI)   . GERD (gastroesophageal reflux disease)   . Dysrhythmia     atrial flutter  . Dyslipidemia   . Atrial flutter     CTI ablation by Dr [name] 02/24/11  . Atrial tachycardia     mapped to AV node by Dr [name], ablation not performed  . Atrial fibrillation   . Premature ventricular contraction   . First degree AV block   . Gout     bilateral feet  . Lymphadenopathy of right cervical region ?Mono 04/16/2013  . Hx of cardiovascular stress test     Lexiscan Myoview (07/2013):  Inf and inferolateral scar, no ischemia, no gated   Past Surgical History  Procedure Laterality Date  . Root canal    . Mouth surgery    . Atrial ablation surgery  02/24/11    CTI ablation by Dr [name]  . Cardioversion (bedside)  03/29/2011       . Cardioversion  03/29/2011    Procedure: CARDIOVERSION;  Surgeon: [name], MD;  Location: [facility] OR;  Service:  Cardiovascular;  Laterality: N/A;  . Examination under anesthesia  12/19/12    Anal Fistula  . Atrial flutter ablation N/A 02/24/2011    Procedure: ATRIAL FLUTTER ABLATION;  Surgeon: [name], MD;  Location: [facility] CATH LAB;  Service: Cardiovascular;  Laterality: N/A;  . Atrial fibrillation ablation  04/24/14, 08/13/14    PVI at [facility] by Dr [name], PVI with posterior wall Box and FIRM ablation performed     Current Outpatient Prescriptions  Medication Sig Dispense Refill  . acyclovir (ZOVIRAX) 400 MG tablet Take 400 mg by mouth 2 (two) times daily as needed (outbreak).     [name] allopurinol (ZYLOPRIM) 300 MG tablet Take 300 mg by mouth every morning.     [name] ALPRAZolam (XANAX) 0.5 MG tablet Take 0.5 tablets by mouth daily as needed. anxiety  1  . atorvastatin (LIPITOR) 20 MG tablet Take 20 mg by mouth daily.    [name] b complex vitamins capsule Take 1 capsule by mouth daily.     . cholecalciferol (VITAMIN D) 1000 UNITS tablet Take 1,000 Units by mouth 2 (two) times daily.    [name] ENTRESTO 49-51 MG TAKE 1 TABLET BY MOUTH TWICE A DAY 60 tablet 6  . fish oil-omega-3 fatty acids 1000 MG capsule Take 2 g by mouth daily.      . indomethacin (INDOCIN) 25  MG capsule Take 25 mg by mouth 3 (three) times daily as needed. gout    . magnesium oxide (MAG-OX) 400 MG tablet Take 1 tablet by mouth daily.    . Multiple Vitamin (MULITIVITAMIN WITH MINERALS) TABS Take 1 tablet by mouth daily.      [name] omeprazole (PRILOSEC) 20 MG capsule Take 20 mg by mouth 2 (two) times daily.  0  . PRADAXA 150 MG CAPS capsule TAKE ONE CAPSULE BY MOUTH TWICE A DAY 60 capsule 6  . spironolactone (ALDACTONE) 25 MG tablet TAKE 1/2 TABLET BY MOUTH ONCE DAILY 15 tablet 3   No current facility-administered medications for this visit.    Allergies:   Review of patient's allergies indicates no known allergies.   Social History:  The patient  reports that he quit smoking about 4 years ago. He has never used smokeless tobacco. He reports that he  uses illicit drugs (Marijuana). He reports that he does not drink alcohol.   Family History:  The patient's  family history includes Achondroplasia in his mother and son; Alcohol abuse in his mother; Dilated cardiomyopathy in his mother; Heart disease in his mother; Other in his maternal aunt; Sudden death in his maternal aunt, maternal grandmother, and mother.    ROS:  Please see the history of present illness.   All other systems are reviewed and negative.    PHYSICAL EXAM: VS:  BP 108/70 mmHg  Pulse 65  Ht 6\' 2"  (1.88 m)  Wt 110.587 kg (243 lb 12.8 oz)  BMI 31.29 kg/m2 , BMI Body mass index is 31.29 kg/(m^2). GEN: Well nourished, well developed, in no acute distress HEENT: normal Neck: no JVD, carotid bruits, or masses Cardiac: iRRR  Respiratory:  clear to auscultation bilaterally, normal work of breathing GI: soft, nontender, nondistended, + BS MS: no deformity or atrophy Skin: warm and dry  Neuro:  Strength and sensation are intact Psych: euthymic mood, full affect  EKG:  EKG is ordered today. The ekg ordered today shows afib with controlled V response   Recent Labs: 12/19/2013: Pro B Natriuretic peptide (BNP) 699.90* 05/01/2014: B Natriuretic Peptide 38.8; BUN 15; Creatinine 0.69; Hemoglobin 15.7; Platelets 220; Potassium 3.7; Sodium 139    Lipid Panel  No results found for: CHOL, TRIG, HDL, CHOLHDL, VLDL, LDLCALC, LDLDIRECT   Wt Readings from Last 3 Encounters:  08/28/14 110.587 kg (243 lb 12.8 oz)  06/08/14 110.859 kg (244 lb 6.4 oz)  05/06/14 112.038 kg (247 lb)      ASSESSMENT AND PLAN:  1.  Persistent atrial fibrillation He has had ERAF post ablation but is clinically stable.  Given sinus bradycardia and AV block, I am reluctant to consider AAD therapy. Continue anticoagulation He has scheduled follow-up next week at [facility] to discuss BiV ICD.  Once the device is in place, I think that we could more safely start AADs at that time.  2. Nonischemic  CM euvolemic today Genetic mutation discovered. I have encouraged genetic counseling. BiV ICD plans at [facility] are noted.  3. First degree AV block/ sick sinus syndrome Would likely benefit from BiV device at time of implant.  4. ETOH He has reduced his drinking substantially  Current medicines are reviewed at length with the patient today.   The patient does not have concerns regarding his medicines.  The following changes were made today:  none  Return to see me in 4 months (would be 3 months post implant).  We can assist with wound check if necessary.  This is a very complex patient with multiple uncontrolled medical issues.  He is at high risk of decompensation/ hospitalization.  A high level of decision making is required for the visit today.   Randolm Idol, MD  08/28/2014 10:20 AM     [facility] [address] [place] [address] [phone] (office) [phone] (fax)

## 2014-08-28 NOTE — Patient Instructions (Signed)
Medication Instructions: - no changes  Labwork: - none  Procedures/Testing: - none  Follow-Up: - Your physician wants you to follow-up in: 4 months ( October 2016) with Dr. Johney Frame You will receive a reminder letter in the mail two months in advance. If you don't receive a letter, please call our office to schedule the follow-up appointment.  Any Additional Special Instructions Will Be Listed Below (If Applicable).

## 2014-11-15 ENCOUNTER — Other Ambulatory Visit (HOSPITAL_COMMUNITY): Payer: Self-pay | Admitting: Internal Medicine

## 2014-11-15 ENCOUNTER — Other Ambulatory Visit (HOSPITAL_COMMUNITY): Payer: Self-pay | Admitting: Cardiology

## 2014-12-09 DIAGNOSIS — Z4502 Encounter for adjustment and management of automatic implantable cardiac defibrillator: Secondary | ICD-10-CM | POA: Insufficient documentation

## 2014-12-22 ENCOUNTER — Telehealth: Payer: Self-pay | Admitting: Internal Medicine

## 2014-12-22 NOTE — Telephone Encounter (Signed)
New message      Pt is having a cardioversion at [facility] on 03-31-14.  Will he still need to keep his appt with Dr Johney Frame on mon, oct 3rd?

## 2014-12-22 NOTE — Telephone Encounter (Signed)
Donald Grant to call and reschedule

## 2014-12-28 ENCOUNTER — Ambulatory Visit: Payer: BLUE CROSS/BLUE SHIELD | Admitting: Internal Medicine

## 2015-01-18 ENCOUNTER — Ambulatory Visit: Payer: BLUE CROSS/BLUE SHIELD | Admitting: Internal Medicine

## 2015-01-20 ENCOUNTER — Ambulatory Visit (INDEPENDENT_AMBULATORY_CARE_PROVIDER_SITE_OTHER): Payer: 59 | Admitting: Internal Medicine

## 2015-01-20 ENCOUNTER — Encounter: Payer: Self-pay | Admitting: Internal Medicine

## 2015-01-20 VITALS — BP 122/76 | HR 88 | Ht 74.0 in | Wt 254.4 lb

## 2015-01-20 DIAGNOSIS — I429 Cardiomyopathy, unspecified: Secondary | ICD-10-CM | POA: Diagnosis not present

## 2015-01-20 DIAGNOSIS — I10 Essential (primary) hypertension: Secondary | ICD-10-CM | POA: Diagnosis not present

## 2015-01-20 DIAGNOSIS — F1010 Alcohol abuse, uncomplicated: Secondary | ICD-10-CM

## 2015-01-20 DIAGNOSIS — I482 Chronic atrial fibrillation, unspecified: Secondary | ICD-10-CM

## 2015-01-20 LAB — CUP PACEART INCLINIC DEVICE CHECK
Battery Voltage: 2.99 V
Brady Statistic AP VP Percent: 1.29 %
Brady Statistic AP VS Percent: 0 %
Brady Statistic AS VP Percent: 93.05 %
Brady Statistic RA Percent Paced: 1.3 %
Brady Statistic RV Percent Paced: 94.46 %
Date Time Interrogation Session: 20161026160627
HIGH POWER IMPEDANCE MEASURED VALUE: 73 Ohm
Implantable Lead Location: 753859
Implantable Lead Location: 753860
Implantable Lead Location: 753862
Implantable Lead Model: 3830
Lead Channel Impedance Value: 285 Ohm
Lead Channel Impedance Value: 475 Ohm
Lead Channel Impedance Value: 513 Ohm
Lead Channel Impedance Value: 608 Ohm
Lead Channel Pacing Threshold Amplitude: 0.75 V
Lead Channel Pacing Threshold Amplitude: 1.625 V
Lead Channel Pacing Threshold Pulse Width: 0.4 ms
Lead Channel Sensing Intrinsic Amplitude: 11.875 mV
Lead Channel Sensing Intrinsic Amplitude: 3.125 mV
Lead Channel Setting Sensing Sensitivity: 0.3 mV
MDC IDC LEAD IMPLANT DT: 20160628
MDC IDC LEAD IMPLANT DT: 20160628
MDC IDC LEAD IMPLANT DT: 20160628
MDC IDC MSMT BATTERY REMAINING LONGEVITY: 93 mo
MDC IDC MSMT LEADCHNL LV IMPEDANCE VALUE: 304 Ohm
MDC IDC MSMT LEADCHNL LV PACING THRESHOLD PULSEWIDTH: 0.4 ms
MDC IDC MSMT LEADCHNL RA SENSING INTR AMPL: 4.5 mV
MDC IDC MSMT LEADCHNL RV IMPEDANCE VALUE: 418 Ohm
MDC IDC SET LEADCHNL LV PACING AMPLITUDE: 2.25 V
MDC IDC SET LEADCHNL LV PACING PULSEWIDTH: 0.4 ms
MDC IDC SET LEADCHNL RA PACING AMPLITUDE: 3.5 V
MDC IDC SET LEADCHNL RV PACING AMPLITUDE: 2 V
MDC IDC SET LEADCHNL RV PACING PULSEWIDTH: 0.4 ms
MDC IDC STAT BRADY AS VS PERCENT: 5.66 %

## 2015-01-20 MED ORDER — CARVEDILOL 6.25 MG PO TABS: 6.2500 mg | ORAL_TABLET | Freq: Two times a day (BID) | ORAL | Status: DC

## 2015-01-20 NOTE — Progress Notes (Signed)
Date:  01/20/2015   ID:  Donald Grant, DOB 1976-02-23, MRN [id number]  PCP:  [name], MD  Primary Electrophysiologist: [name] MD   CC: fatigue   History of Present Illness: Donald Grant is a 39 y.o. male who presents today for a routine electrophysiology office visit.   He remains at about his baseline exertional capacity, since his BIV implant hasn't noted any clear improvement but mentions he has started to exercise on the treadmill.  No rest SOB, he has some occasional lightheadedness that is not new for him over the years, no syncope.  He has not been shocked by his device.  He reports at his last visit by [facility] they mentioned potentially another ablation in the future.  He is s/p PVI at [facility] 04/24/14 and subsequent cardioversion 2/16.  He underwent repeat PVI 08/13/14 with PVI, posterior box isolation, and FRIM (Topera) performed at [facility].   A BIVE ICD June 2016 at [facility] and a DCCV a week ago also at [facility].  He is scheduled to have a BiV ICD June 28th.     Past Medical History  Diagnosis Date  . Recurrent upper respiratory infection (URI)   . GERD (gastroesophageal reflux disease)   . Dyslipidemia   . Atrial flutter [facility])     CTI ablation by Dr [name] 02/24/11  . Atrial tachycardia (HCC)     mapped to AV node by Dr [name], ablation not performed  . Atrial fibrillation (HCC)     PVI ablatioin at [facility] 04/21/14 and May 2016 Dr. [name]  . Premature ventricular contraction   . First degree AV block   . Gout     bilateral feet  . Lymphadenopathy of right cervical region ?Mono 04/16/2013  . Hx of cardiovascular stress test     Lexiscan Myoview (07/2013):  Inf and inferolateral scar, no ischemia, no gated  . Cardiomyopathy, nonischemic (HCC)     Medtronic BIVE ICD    Past Surgical History  Procedure Laterality Date  . Root canal    . Mouth surgery    . Atrial ablation surgery  02/24/11    CTI ablation by Dr [name]  . Cardioversion (bedside)  03/29/2011    . Cardioversion  03/29/2011    Procedure: CARDIOVERSION;  Surgeon: [name], MD;  Location: [facility] OR;  Service: Cardiovascular;  Laterality: N/A;  . Examination under anesthesia  12/19/12    Anal Fistula  . Atrial flutter ablation N/A 02/24/2011    Procedure: ATRIAL FLUTTER ABLATION;  Surgeon: [name], MD;  Location: [facility] CATH LAB;  Service: Cardiovascular;  Laterality: N/A;  . Atrial fibrillation ablation  04/24/14, 08/13/14    PVI at [facility] by Dr [name], PVI with posterior wall Box and FIRM ablation performed     Current Outpatient Prescriptions  Medication Sig Dispense Refill  . acyclovir (ZOVIRAX) 400 MG tablet Take 400 mg by mouth 2 (two) times daily as needed (outbreak).     [name] allopurinol (ZYLOPRIM) 300 MG tablet Take 300 mg by mouth every morning.     [name] ALPRAZolam (XANAX) 0.5 MG tablet Take 0.5 tablets by mouth daily as needed. anxiety  1  . atorvastatin (LIPITOR) 20 MG tablet Take 20 mg by mouth daily.    [name] b complex vitamins capsule Take 1 capsule by mouth daily.     . carvedilol (COREG) 6.25 MG tablet Take 1 tablet (6.25 mg total) by mouth 2 (two) times daily. 180 tablet 3  .  cholecalciferol (VITAMIN D) 1000 UNITS tablet Take 1,000 Units by mouth 2 (two) times daily.    [name] ENTRESTO 49-51 MG TAKE 1 TABLET BY MOUTH TWICE A DAY 60 tablet 6  . fish oil-omega-3 fatty acids 1000 MG capsule Take 2 g by mouth daily.      . indomethacin (INDOCIN) 25 MG capsule Take 25 mg by mouth 3 (three) times daily as needed. gout    . magnesium oxide (MAG-OX) 400 MG tablet Take 1 tablet by mouth daily.    . Multiple Vitamin (MULITIVITAMIN WITH MINERALS) TABS Take 1 tablet by mouth daily.      [name] omeprazole (PRILOSEC) 20 MG capsule Take 20 mg by mouth 2 (two) times daily.  0  . PRADAXA 150 MG CAPS capsule TAKE ONE CAPSULE BY MOUTH TWICE A DAY 60 capsule 6  . spironolactone (ALDACTONE) 25 MG tablet TAKE 1/2 TABLET BY MOUTH ONCE DAILY 15 tablet 3   No current facility-administered medications for  this visit.    Allergies:   Review of patient's allergies indicates no known allergies.   Social History:  The patient  reports that he quit smoking about 4 years ago. He has never used smokeless tobacco. He reports that he uses illicit drugs (Marijuana). He reports that he does not drink alcohol.   Family History:  The patient's  family history includes Achondroplasia in his mother and son; Alcohol abuse in his mother; Dilated cardiomyopathy in his mother; Heart disease in his mother; Other in his maternal aunt; Sudden death in his maternal aunt, maternal grandmother, and mother.    ROS:  Please see the history of present illness.   All other systems are reviewed and negative.   122/76, 88bpm  PHYSICAL EXAM:  GEN: Over wieght male in no acute distress HEENT: normal Neck: no JVD, carotid bruits, or masses Cardiac: RRR (paced)  Respiratory:  clear to auscultation bilaterally, normal work of breathing GI: soft, nontender, nondistended, + BS MS: no deformity or atrophy Skin: warm and dry  Neuro:  Strength and sensation are intact Psych: euthymic mood, full affect ICD site is stable  EKG:  EKG is ordered today. The ekg ordered today shows afib with V pacing  Recent Labs: ([facility]) 09/21/14: BUN/Creat 13/0.7, H/H 14.2/40.4 05/01/2014: B Natriuretic Peptide 38.8; BUN 15; Creatinine, Ser 0.69; Hemoglobin 15.7; Platelets 220; Potassium 3.7; Sodium 139    Wt Readings from Last 3 Encounters:  01/20/15 254 lb 6.4 oz (115.395 kg)  08/28/14 243 lb 12.8 oz (110.587 kg)  06/08/14 244 lb 6.4 oz (110.859 kg)      ASSESSMENT AND PLAN:  1.  Persistent atrial fibrillation PVI ablation at [facility] January and May 2016 CTI ablation by Dr. [name] in 2012 DCCV Feb and Oct 2016 CHADS2Vasc is 1 On Pradaxa, he has not missed any doses I would advise that we consider AAD therapy (likely tikosyn at this time).  I will discuss with Dr [name] at [facility] to see if he has any additional thoughts.  2.  Nonischemic CM euvolemic today Genetic mutation discovered. I have encouraged genetic counseling. Now is s/p MDT BIV ICD Normal device function, he is in AFlutter Will add Coreg today now with a device Carelink   3. ETOH Re-counseled, he reports doing better with this lately  Current medicines are reviewed at length with the patient today.   The patient does not have concerns regarding his medicines.  The following changes were made today:  Add Coreg 6.25mg  BID  I will call  Dr. [name] to discuss the plan of care possibly AAD since he is in aflutter today. This is a very complex patient with multiple uncontrolled medical issues.  He is at high risk of decompensation/ hospitalization.  A high level of decision making is required for the visit today. Return to see me in 3 months,   Randolm Idol MD 01/20/2015 2:12 PM     [facility] [address] [place] [address] [phone] (office) [phone] (fax)

## 2015-01-20 NOTE — Patient Instructions (Signed)
Medication Instructions:  Your physician has recommended you make the following change in your medication:  1) Start Carvedilol 6.25mg  twice daily    Labwork: None ordered   Testing/Procedures: None ordered   Follow-Up: Your physician recommends that you schedule a follow-up appointment in: 3 months with Dr Johney Frame   Any Other Special Instructions Will Be Listed Below (If Applicable).     If you need a refill on your cardiac medications before your next appointment, please call your pharmacy.

## 2015-01-26 NOTE — Progress Notes (Signed)
See note for details.

## 2015-01-27 NOTE — Addendum Note (Signed)
Addended by: Reesa Chew on: 01/27/2015 05:38 PM   Modules accepted: Orders

## 2015-02-03 ENCOUNTER — Telehealth (HOSPITAL_COMMUNITY): Payer: Self-pay

## 2015-02-03 MED ORDER — SACUBITRIL-VALSARTAN 49-51 MG PO TABS: 1.0000 | ORAL_TABLET | Freq: Two times a day (BID) | ORAL | Status: DC

## 2015-02-03 NOTE — Telephone Encounter (Signed)
RX refill for Entresto sent to [facility] pharmacy

## 2015-02-04 ENCOUNTER — Telehealth (HOSPITAL_COMMUNITY): Payer: Self-pay

## 2015-02-04 NOTE — Telephone Encounter (Signed)
[employer] PA for [employer] approved. [facility] verified copay $60/month.   Tyler Deis. Bonnye Fava, PharmD, BCPS, CPP Clinical Pharmacist Pager: [phone] Phone: [phone] 02/04/2015 4:14 PM

## 2015-02-04 NOTE — Telephone Encounter (Signed)
Medication Samples have been provided to the patient.  Drug name: Kathyrn Lass: 2 bottles #56 tablets  LOT: [id number]  Exp.Date: Geb 2017  The patient has been instructed regarding the correct time, dose, and frequency of taking this medication, including desired effects and most common side effects.   Eustace Pen 9:58 AM 02/04/2015   Since last refill he has switched his health care insurance to [facility] and his pharmacy said that the insurance company needs an authorization letter  Routed to [employer]

## 2015-02-13 ENCOUNTER — Other Ambulatory Visit: Payer: Self-pay | Admitting: Internal Medicine

## 2015-02-23 ENCOUNTER — Encounter (HOSPITAL_COMMUNITY): Payer: Self-pay | Admitting: Physician Assistant

## 2015-02-23 ENCOUNTER — Ambulatory Visit (INDEPENDENT_AMBULATORY_CARE_PROVIDER_SITE_OTHER): Payer: 59 | Admitting: Pharmacist

## 2015-02-23 ENCOUNTER — Inpatient Hospital Stay (HOSPITAL_COMMUNITY)
Admission: AD | Admit: 2015-02-23 | Discharge: 2015-02-26 | DRG: 310 | Disposition: A | Discharge status: Home / Self Care | Payer: 59 | Source: Ambulatory Visit | Attending: Internal Medicine | Admitting: Internal Medicine

## 2015-02-23 DIAGNOSIS — Z87891 Personal history of nicotine dependence: Secondary | ICD-10-CM | POA: Diagnosis not present

## 2015-02-23 DIAGNOSIS — I4891 Unspecified atrial fibrillation: Secondary | ICD-10-CM | POA: Diagnosis present

## 2015-02-23 DIAGNOSIS — I482 Chronic atrial fibrillation, unspecified: Secondary | ICD-10-CM

## 2015-02-23 DIAGNOSIS — K219 Gastro-esophageal reflux disease without esophagitis: Secondary | ICD-10-CM | POA: Diagnosis present

## 2015-02-23 DIAGNOSIS — I480 Paroxysmal atrial fibrillation: Secondary | ICD-10-CM | POA: Diagnosis not present

## 2015-02-23 DIAGNOSIS — I429 Cardiomyopathy, unspecified: Secondary | ICD-10-CM | POA: Diagnosis not present

## 2015-02-23 DIAGNOSIS — I481 Persistent atrial fibrillation: Secondary | ICD-10-CM

## 2015-02-23 DIAGNOSIS — Z95810 Presence of automatic (implantable) cardiac defibrillator: Secondary | ICD-10-CM

## 2015-02-23 DIAGNOSIS — I4819 Other persistent atrial fibrillation: Secondary | ICD-10-CM | POA: Diagnosis present

## 2015-02-23 DIAGNOSIS — F1010 Alcohol abuse, uncomplicated: Secondary | ICD-10-CM | POA: Diagnosis present

## 2015-02-23 DIAGNOSIS — I484 Atypical atrial flutter: Secondary | ICD-10-CM | POA: Diagnosis not present

## 2015-02-23 DIAGNOSIS — Z79899 Other long term (current) drug therapy: Secondary | ICD-10-CM

## 2015-02-23 DIAGNOSIS — M109 Gout, unspecified: Secondary | ICD-10-CM | POA: Diagnosis present

## 2015-02-23 DIAGNOSIS — E785 Hyperlipidemia, unspecified: Secondary | ICD-10-CM | POA: Diagnosis present

## 2015-02-23 LAB — BASIC METABOLIC PANEL
Anion gap: 9 (ref 5–15)
BUN: 11 mg/dL (ref 7–25)
BUN: 9 mg/dL (ref 6–20)
CALCIUM: 9.1 mg/dL (ref 8.6–10.3)
CALCIUM: 9.2 mg/dL (ref 8.9–10.3)
CHLORIDE: 104 mmol/L (ref 101–111)
CO2: 25 mmol/L (ref 20–31)
CO2: 28 mmol/L (ref 22–32)
CREATININE: 0.69 mg/dL (ref 0.61–1.24)
Chloride: 102 mmol/L (ref 98–110)
Creat: 0.74 mg/dL (ref 0.60–1.35)
GFR calc non Af Amer: >60 mL/min (ref >60)
Glucose, Bld: 105 mg/dL — ABNORMAL HIGH (ref 65–99)
Glucose, Bld: 117 mg/dL — ABNORMAL HIGH (ref 65–99)
POTASSIUM: 4.2 mmol/L (ref 3.5–5.3)
Potassium: 3.9 mmol/L (ref 3.5–5.1)
SODIUM: 134 mmol/L — AB (ref 135–146)
SODIUM: 141 mmol/L (ref 135–145)

## 2015-02-23 LAB — MAGNESIUM
MAGNESIUM: 2.1 mg/dL (ref 1.7–2.4)
Magnesium: 2 mg/dL (ref 1.5–2.5)

## 2015-02-23 MED ORDER — MAGNESIUM OXIDE 400 (241.3 MG) MG PO TABS
400.0000 mg | ORAL_TABLET | Freq: Every day | ORAL | Status: DC
  Administered 2015-02-24 – 2015-02-26 (×3): 400 mg via ORAL
  Filled 2015-02-23 (×3): qty 1

## 2015-02-23 MED ORDER — SACUBITRIL-VALSARTAN 49-51 MG PO TABS
1.0000 | ORAL_TABLET | Freq: Two times a day (BID) | ORAL | Status: DC
  Administered 2015-02-23 – 2015-02-26 (×6): 1 via ORAL
  Filled 2015-02-23 (×7): qty 1

## 2015-02-23 MED ORDER — DABIGATRAN ETEXILATE MESYLATE 150 MG PO CAPS
150.0000 mg | ORAL_CAPSULE | Freq: Two times a day (BID) | ORAL | Status: DC
  Administered 2015-02-23 – 2015-02-26 (×6): 150 mg via ORAL
  Filled 2015-02-23 (×7): qty 1

## 2015-02-23 MED ORDER — ALLOPURINOL 300 MG PO TABS
300.0000 mg | ORAL_TABLET | Freq: Every day | ORAL | Status: DC
  Administered 2015-02-24 – 2015-02-26 (×3): 300 mg via ORAL
  Filled 2015-02-23 (×4): qty 1

## 2015-02-23 MED ORDER — DOFETILIDE 250 MCG PO CAPS
500.0000 ug | ORAL_CAPSULE | Freq: Two times a day (BID) | ORAL | Status: DC
  Administered 2015-02-23 – 2015-02-26 (×6): 500 ug via ORAL
  Filled 2015-02-23 (×7): qty 2

## 2015-02-23 MED ORDER — ACYCLOVIR 400 MG PO TABS
400.0000 mg | ORAL_TABLET | Freq: Two times a day (BID) | ORAL | Status: DC | PRN
  Filled 2015-02-23: qty 1

## 2015-02-23 MED ORDER — ALPRAZOLAM 0.25 MG PO TABS: 0.2500 mg | ORAL_TABLET | Freq: Every day | ORAL | Status: DC | PRN

## 2015-02-23 MED ORDER — ATORVASTATIN CALCIUM 20 MG PO TABS
20.0000 mg | ORAL_TABLET | Freq: Every day | ORAL | Status: DC
  Administered 2015-02-23 – 2015-02-25 (×3): 20 mg via ORAL
  Filled 2015-02-23 (×3): qty 1

## 2015-02-23 MED ORDER — POTASSIUM CHLORIDE CRYS ER 20 MEQ PO TBCR
20.0000 meq | EXTENDED_RELEASE_TABLET | Freq: Once | ORAL | Status: AC
  Administered 2015-02-23: 20 meq via ORAL
  Filled 2015-02-23: qty 1

## 2015-02-23 MED ORDER — ADULT MULTIVITAMIN W/MINERALS CH
1.0000 | ORAL_TABLET | Freq: Every day | ORAL | Status: DC
  Administered 2015-02-24 – 2015-02-26 (×3): 1 via ORAL
  Filled 2015-02-23 (×3): qty 1

## 2015-02-23 MED ORDER — MAGNESIUM OXIDE 400 MG PO TABS: 400.0000 mg | ORAL_TABLET | Freq: Every day | ORAL | Status: DC

## 2015-02-23 MED ORDER — SPIRONOLACTONE 25 MG PO TABS
12.5000 mg | ORAL_TABLET | Freq: Every day | ORAL | Status: DC
  Administered 2015-02-24 – 2015-02-26 (×3): 12.5 mg via ORAL
  Filled 2015-02-23 (×4): qty 1

## 2015-02-23 MED ORDER — SODIUM CHLORIDE 0.9 % IJ SOLN
3.0000 mL | Freq: Two times a day (BID) | INTRAMUSCULAR | Status: DC
Start: 2015-02-23 — End: 2015-02-26
  Administered 2015-02-23 – 2015-02-26 (×6): 3 mL via INTRAVENOUS

## 2015-02-23 MED ORDER — CARVEDILOL 6.25 MG PO TABS
6.2500 mg | ORAL_TABLET | Freq: Two times a day (BID) | ORAL | Status: DC
  Administered 2015-02-23 – 2015-02-26 (×6): 6.25 mg via ORAL
  Filled 2015-02-23: qty 2
  Filled 2015-02-23: qty 1
  Filled 2015-02-23: qty 2
  Filled 2015-02-23 (×4): qty 1

## 2015-02-23 MED ORDER — SODIUM CHLORIDE 0.9 % IJ SOLN: 3.0000 mL | INTRAMUSCULAR | Status: DC | PRN

## 2015-02-23 MED ORDER — SODIUM CHLORIDE 0.9 % IV SOLN: 250.0000 mL | INTRAVENOUS | Status: DC | PRN

## 2015-02-23 MED ORDER — VITAMIN D 1000 UNITS PO TABS
1000.0000 [IU] | ORAL_TABLET | Freq: Two times a day (BID) | ORAL | Status: DC
  Administered 2015-02-23 – 2015-02-26 (×6): 1000 [IU] via ORAL
  Filled 2015-02-23 (×6): qty 1

## 2015-02-23 MED ORDER — INDOMETHACIN 25 MG PO CAPS
25.0000 mg | ORAL_CAPSULE | Freq: Three times a day (TID) | ORAL | Status: DC | PRN
Start: 2015-02-23 — End: 2015-02-26
  Filled 2015-02-23: qty 1

## 2015-02-23 MED ORDER — PANTOPRAZOLE SODIUM 40 MG PO TBEC
40.0000 mg | DELAYED_RELEASE_TABLET | Freq: Every day | ORAL | Status: DC
  Administered 2015-02-24 – 2015-02-26 (×3): 40 mg via ORAL
  Filled 2015-02-23 (×3): qty 1

## 2015-02-23 NOTE — Progress Notes (Signed)
Pharmacy Review for Dofetilide (Tikosyn) Initiation  Admit Complaint: 39 y.o. male admitted 02/23/2015 with atrial fibrillation to be initiated on dofetilide.   Assessment:  Patient Exclusion Criteria: If any screening criteria checked as "Yes", then  patient  should NOT receive dofetilide until criteria item is corrected. If "Yes" please indicate correction plan.  YES  NO Patient  Exclusion Criteria Correction Plan    Baseline QTc interval is greater than or equal to 440 msec. IF above YES box checked dofetilide contraindicated unless patient has ICD; then may proceed if QTc 500-550 msec or with known ventricular conduction abnormalities may proceed with QTc 550-600 msec. QTc =    Has ICD     Magnesium level is less than 1.8 mEq/l : Last magnesium:  Lab Results  Component Value Date   MG 2.0 02/23/2015           Potassium level is less than 4 mEq/l : Last potassium:  Lab Results  Component Value Date   K 4.2 02/23/2015           Patient is known or suspected to have a digoxin level greater than 2 ng/ml: No results found for: DIGOXIN      Creatinine clearance less than 20 ml/min (calculated using Cockcroft-Gault, actual body weight and serum creatinine): Estimated Creatinine Clearance: 164.9 mL/min (by C-G formula based on Cr of 0.74).      Patient has received drugs known to prolong the QT intervals within the last 48 hours (phenothiazines, tricyclics or tetracyclic antidepressants, erythromycin, H-1 antihistamines, cisapride, fluoroquinolones, azithromycin). Drugs not listed above may have an, as yet, undetected potential to prolong the QT interval, updated information on QT prolonging agents is available at this website:QT prolonging agents     Patient received a dose of hydrochlorothiazide (Oretic) alone or in any combination including triamterene (Dyazide, Maxzide) in the last 48 hours.     Patient received a medication known to increase  dofetilide plasma concentrations prior to initial dofetilide dose:  . Trimethoprim (Primsol, Proloprim) in the last 36 hours . Verapamil (Calan, Verelan) in the last 36 hours or a sustained release dose in the last 72 hours . Megestrol (Megace) in the last 5 days  . Cimetidine (Tagamet) in the last 6 hours . Ketoconazole (Nizoral) in the last 24 hours . Itraconazole (Sporanox) in the last 48 hours  . Prochlorperazine (Compazine) in the last 36 hours      Patient is known to have a history of torsades de pointes; congenital or acquired long QT syndromes.     Patient has received a Class 1 antiarrhythmic with less than 2 half-lives since last dose. (Disopyramide, Quinidine, Procainamide, Lidocaine, Mexiletine, Flecainide, Propafenone)     Patient has received amiodarone therapy in the past 3 months or amiodarone level is greater than 0.3 ng/ml.    Patient has been appropriately anticoagulated with Pradaxa.  Ordering provider was confirmed at [email or web address] if they are not listed on the [facility] Authorized Prescribers list.  Goal of Therapy: Follow renal function, electrolytes, potential drug interactions, and dose adjustment. Provide education and 1 week supply at discharge.  Plan:    Physician selected initial dose within range recommended for patients level of renal function - will monitor for response.    Physician selected initial dose outside of range recommended for patients level of renal function - will discuss if the dose should be altered at this time.   Select One Calculated CrCl  Dose q12h   > 60 ml/min 500  mcg  []  40-60 ml/min 250 mcg  []  20-40 ml/min 125 mcg   2. Follow up QTc after the first 5 doses, renal function, electrolytes (K & Mg) daily x 3     days, dose adjustment, success of initiation and facilitate 1 week discharge supply as     clinically indicated.  3. Initiate Tikosyn education video (Call [phone] and ask for video # 116).  4.  Place Enrollment Form on the chart for discharge supply of dofetilide.  Kamisha Ell D. Aashritha Miedema, PharmD, BCPS Clinical Pharmacist Pager: [phone] 02/23/2015 4:56 PM

## 2015-02-23 NOTE — Progress Notes (Signed)
Patient ID: Donald Grant DOB: 1975-09-02, 39 yo MRN: [id number]     HPI Donald Grant is a 39 yo male patient of Dr. Johney Frame who presents for Tikosyn initiation. He is s/p PVI at [facility] 04/24/14 and subsequent cardioversion 2/16. He underwent repeat PVI 08/13/14 with PVI, posterior box isolation, and FRIM ([place]) performed at [facility].A BIVE ICD June 2016 at [facility] and a DCCV in October 2016 also at [facility].   Pt is here with his wife. We reviewed potential side effects of Tikosyn including QTc prolongation. He is aware of the importance of not missing doses and will call the office if he misses more than 2 doses in a row. Reviewed medication list.Patient is not currently taking any QTc prolonging drugs. He does take magnesium supplements  daily. He has been compliant with Pradaxa and not missed any doses within the past 30 days. He is aware to inform all of his other providers about Tikosyn and to call the office if there is ever a question about taking a new medication.   EKG reviewed by Dr. Elberta Fortis. Atrial Fibrillation with vent rate of 69 bpm. QTc 447 msec, patient has an ICD.  Labs: K 4.2, Mg 2, SCr , Wt 252 lbs, CrCl >100 mL/min   Past Medical History  Diagnosis Date  . Recurrent upper respiratory infection (URI)   . GERD (gastroesophageal reflux disease)   . Dyslipidemia   . Atrial flutter [facility])     CTI ablation by Dr [name] 02/24/11  . Atrial tachycardia (HCC)     mapped to AV node by Dr [name], ablation not performed  . Atrial fibrillation (HCC)     PVI ablatioin at [facility] 04/21/14 and May 2016 Dr. [name]  . Premature ventricular contraction   . First degree AV block   . Gout     bilateral feet  . Lymphadenopathy of right cervical region ?Mono 04/16/2013  . Hx of cardiovascular stress test     Lexiscan Myoview (07/2013):  Inf and inferolateral scar, no ischemia, no gated  . Cardiomyopathy, nonischemic (HCC)     Medtronic BIVE ICD     Current Outpatient Prescriptions on File Prior to Visit  Medication Sig Dispense Refill  . acyclovir (ZOVIRAX) 400 MG tablet Take 400 mg by mouth 2 (two) times daily as needed (outbreak).     [name] allopurinol (ZYLOPRIM) 300 MG tablet Take 300 mg by mouth every morning.     [name] ALPRAZolam (XANAX) 0.5 MG tablet Take 0.5 tablets by mouth daily as needed. anxiety  1  . atorvastatin (LIPITOR) 20 MG tablet Take 20 mg by mouth daily.    [name] b complex vitamins capsule Take 1 capsule by mouth daily.     . carvedilol (COREG) 6.25 MG tablet Take 1 tablet (6.25 mg total) by mouth 2 (two) times daily. 180 tablet 3  . cholecalciferol (VITAMIN D) 1000 UNITS tablet Take 1,000 Units by mouth 2 (two) times daily.    . fish oil-omega-3 fatty acids 1000 MG capsule Take 2 g by mouth daily.      . indomethacin (INDOCIN) 25 MG capsule Take 25 mg by mouth 3 (three) times daily as needed. gout    . magnesium oxide (MAG-OX) 400 MG tablet Take 1 tablet by mouth daily.    . Multiple Vitamin (MULITIVITAMIN WITH MINERALS) TABS Take 1 tablet by mouth daily.      [name] omeprazole (PRILOSEC) 20 MG capsule Take 20 mg by mouth 2 (two) times daily.  0  .  PRADAXA 150 MG CAPS capsule TAKE ONE CAPSULE BY MOUTH TWICE A DAY 60 capsule 11  . sacubitril-valsartan (ENTRESTO) 49-51 MG Take 1 tablet by mouth 2 (two) times daily. 60 tablet 6  . spironolactone (ALDACTONE) 25 MG tablet TAKE 1/2 TABLET BY MOUTH ONCE DAILY 15 tablet 3   No current facility-administered medications on file prior to visit.   No Known Allergies   Assessment/Plan:  1. Atrial fibrillation - Labs and EKG acceptable for Tikosyn initiation. Anticipated starting dose = BID. Patient aware to report to admitting.   Miguelina Fore E. Wana Mount, PharmD [facility] [address] [place], [place] [address] Phone: [phone]; Fax: [phone] 02/23/2015 12:45 PM

## 2015-02-23 NOTE — H&P (Signed)
ELECTROPHYSIOLOGY HISTORY AND PHYSICAL    Patient ID: Donald Grant MRN: [id number], DOB/AGE: 12/17/1975 39 y.o.  Admit date: 02/23/2015 Date of Consult: 02/23/2015  Primary Physician: [name], MD Electrophysiologist: [name]/Daubert ([facility])  CC: Tikosyn load  HPI:  Donald Grant is a 39 y.o. male with a past medical history significant for dyslipidemia, NICM (s/p MDT CRTD), atrial fibrillation and atypical atrial flutter s/p ablation x3 at [facility] who presents toady for Tikosyn admission.  He has had recurrent symptomatic atrial arrhythmias.  Discussion was had with Dr Johney Frame and Dr [name] at [facility] and it was decided to pursue Tikosyn.    He states that he has been doing well recently without chest pain or shortness of breath.  He has not had LE edema, recent fevers, chills, nausea or vomiting.   Past Medical History  Diagnosis Date  . Recurrent upper respiratory infection (URI)   . GERD (gastroesophageal reflux disease)   . Dyslipidemia   . Atrial flutter [facility])     CTI ablation by Dr [name] 02/24/11  . Atrial tachycardia (HCC)     mapped to AV node by Dr [name], ablation not performed  . Atrial fibrillation (HCC)     PVI ablatioin at [facility] 04/21/14 and May 2016 Dr. [name]  . Premature ventricular contraction   . First degree AV block   . Gout     bilateral feet  . Lymphadenopathy of right cervical region ?Mono 04/16/2013  . Hx of cardiovascular stress test     Lexiscan Myoview (07/2013):  Inf and inferolateral scar, no ischemia, no gated  . Cardiomyopathy, nonischemic (HCC)     Medtronic BIVE ICD      Surgical History:  Past Surgical History  Procedure Laterality Date  . Root canal    . Mouth surgery    . Atrial ablation surgery  02/24/11    CTI ablation by Dr [name]  . Cardioversion (bedside)  03/29/2011       . Cardioversion  03/29/2011    Procedure: CARDIOVERSION;  Surgeon: [name], MD;  Location: [facility] OR;  Service: Cardiovascular;  Laterality: N/A;  .  Examination under anesthesia  12/19/12    Anal Fistula  . Atrial flutter ablation N/A 02/24/2011    Procedure: ATRIAL FLUTTER ABLATION;  Surgeon: [name], MD;  Location: [facility] CATH LAB;  Service: Cardiovascular;  Laterality: N/A;  . Atrial fibrillation ablation  04/24/14, 08/13/14    PVI at [facility] by Dr [name], PVI with posterior wall Box and FIRM ablation performed     Prescriptions prior to admission  Medication Sig Dispense Refill Last Dose  . acyclovir (ZOVIRAX) 400 MG tablet Take 400 mg by mouth 2 (two) times daily as needed (outbreak).    Taking  . allopurinol (ZYLOPRIM) 300 MG tablet Take 300 mg by mouth every morning.    Taking  . ALPRAZolam (XANAX) 0.5 MG tablet Take 0.5 tablets by mouth daily as needed. anxiety  1 Taking  . atorvastatin (LIPITOR) 20 MG tablet Take 20 mg by mouth daily.   Taking  . b complex vitamins capsule Take 1 capsule by mouth daily.    Taking  . carvedilol (COREG) 6.25 MG tablet Take 1 tablet (6.25 mg total) by mouth 2 (two) times daily. 180 tablet 3   . cholecalciferol (VITAMIN D) 1000 UNITS tablet Take 1,000 Units by mouth 2 (two) times daily.   Taking  . fish oil-omega-3 fatty acids 1000 MG capsule Take 2 g by mouth daily.  Taking  . indomethacin (INDOCIN) 25 MG capsule Take 25 mg by mouth 3 (three) times daily as needed. gout   Taking  . magnesium oxide (MAG-OX) 400 MG tablet Take 1 tablet by mouth daily.   Taking  . Multiple Vitamin (MULITIVITAMIN WITH MINERALS) TABS Take 1 tablet by mouth daily.     Taking  . omeprazole (PRILOSEC) 20 MG capsule Take 20 mg by mouth 2 (two) times daily.  0 Taking  . PRADAXA 150 MG CAPS capsule TAKE ONE CAPSULE BY MOUTH TWICE A DAY 60 capsule 11   . sacubitril-valsartan (ENTRESTO) 49-51 MG Take 1 tablet by mouth 2 (two) times daily. 60 tablet 6   . spironolactone (ALDACTONE) 25 MG tablet TAKE 1/2 TABLET BY MOUTH ONCE DAILY 15 tablet 3 Taking    Inpatient Medications:   Allergies: No Known Allergies  Social History     Social History  . Marital Status: Married    Spouse Name: N/A  . Number of Children: N/A  . Years of Education: N/A   Occupational History  . Not on file.   Social History Main Topics  . Smoking status: Former Smoker    Quit date: 08/28/2010  . Smokeless tobacco: Never Used  . Alcohol Use: No     Comment: quit 6 months ago  . Drug Use: Yes    Special: Marijuana     Comment: still currently smoking  . Sexual Activity: Yes   Other Topics Concern  . Not on file   Social History Narrative   His additional social history is notable that the patient exercises     several times a week, jogging up to 2 miles a day.  With exercise, he     notes that he has lost over 10 pounds.      Family History  Problem Relation Age of Onset  . Heart disease Mother   . Achondroplasia Mother   . Alcohol abuse Mother   . Achondroplasia Son   . Sudden death Mother   . Sudden death Maternal Aunt   . Sudden death Maternal Grandmother   . Dilated cardiomyopathy Mother   . Other Maternal Aunt     substance abuse     Review of Systems: All other systems reviewed and are otherwise negative except as noted above.  Physical Exam: Filed Vitals:   02/23/15 1505  BP: 119/78  Pulse: 71  Temp: 98.3 F (36.8 C)  TempSrc: Oral  Resp: 18  Height:  (1.88 m)  Weight: 252 lb (114.306 kg)  SpO2: 98%    GEN- The patient is well appearing, alert and oriented x 3 today.   HEENT: normocephalic, atraumatic; sclera clear, conjunctiva pink; hearing intact; oropharynx clear; neck supple  Lungs- Clear to ausculation bilaterally, normal work of breathing.  No wheezes, rales, rhonchi Heart- Regular rate and rhythm (paced) GI- soft, non-tender, non-distended, bowel sounds present  Extremities- no clubbing, cyanosis, or edema; DP/PT/radial pulses 2+ bilaterally MS- no significant deformity or atrophy Skin- warm and dry, no rash or lesion Psych- euthymic mood, full affect Neuro- strength and  sensation are intact  Labs:   Lab Results  Component Value Date   WBC 8.7 05/01/2014   HGB 15.7 05/01/2014   HCT 44.3 05/01/2014   MCV 89.5 05/01/2014   PLT 220 05/01/2014   No results for input(s): NA, K, CL, CO2, BUN, CREATININE, CALCIUM, PROT, BILITOT, ALKPHOS, ALT, AST, GLUCOSE in the last 168 hours.  Invalid input(s): LABALBU   WUJ:WJXBJY fibrillation  with ventricular pacing, QTc with paced QRS  DEVICE HISTORY: MDT CRTD implanted at [facility] with LV lead at His  Assessment/Plan: 1.  Symptomatic atrial arrhythmias (fibrillation and atypical flutter) Admitted for Tikosyn load today Labs pending, will plan to start Tikosyn today once labs back QTc stable for patient when taking into account paced QRS (he also has ICD in place) Continue Pradaxa for CHADS2VASC of at least 1 - he reports compliance without interruption for the past 4 weeks  2.  NICM Euvolemic on exam Continue current medications  3.  ETOH abuse Improved recently Cessation advised   Signed, Gypsy Balsam, NP 02/23/2015 3:27 PM   I have seen, examined the patient, and reviewed the above assessment and plan.  On exam, comfortable appearing. Changes to above are made where necessary.  Will follow QTc closely with initiation of tikosyn.  Co Sign: [name], MD 02/23/2015 4:54 PM

## 2015-02-24 ENCOUNTER — Inpatient Hospital Stay (HOSPITAL_COMMUNITY): Payer: 59

## 2015-02-24 LAB — BASIC METABOLIC PANEL
Anion gap: 7 (ref 5–15)
BUN: 9 mg/dL (ref 6–20)
CALCIUM: 8.8 mg/dL — AB (ref 8.9–10.3)
CO2: 28 mmol/L (ref 22–32)
Chloride: 105 mmol/L (ref 101–111)
Creatinine, Ser: 0.69 mg/dL (ref 0.61–1.24)
GFR calc Af Amer: >60 mL/min (ref >60)
GLUCOSE: 106 mg/dL — AB (ref 65–99)
POTASSIUM: 4 mmol/L (ref 3.5–5.1)
Sodium: 140 mmol/L (ref 135–145)

## 2015-02-24 LAB — MAGNESIUM: MAGNESIUM: 2 mg/dL (ref 1.7–2.4)

## 2015-02-24 NOTE — Progress Notes (Signed)
UR Completed. Shaunessy Dobratz, RN, BSN.  [phone] 

## 2015-02-24 NOTE — Care Management Note (Addendum)
Case Management Note  Patient Details  Name: Donald Grant MRN: [id number] Date of Birth: 11-17-1975  Subjective/Objective:  Pt admitted for Tikosyn Load                  Action/Plan:   Pt is from home with wife.  P[facility] [address].  CM contacted pharmacy and was informed that pharmacy can fill prescription, if inventory is unavailable at time of discharge;  medication can be available next day.  CM informed pt that he should take script to pharmacy day of discharge to be able to pick up next day.  Pt also informed pt that he will be discharge home with 7 day supply.     Expected Discharge Date:                  Expected Discharge Plan:  Home/Self Care (Pt is independent from home with wife)  In-House Referral:     Discharge planning Services  CM Consult  Post Acute Care Choice:    Choice offered to:  Patient  DME Arranged:    DME Agency:     HH Arranged:    HH Agency:     Status of Service:  In process, will continue to follow  Medicare Important Message Given:    Date Medicare IM Given:    Medicare IM give by:    Date Additional Medicare IM Given:    Additional Medicare Important Message give by:     If discussed at Long Length of Stay Meetings, dates discussed:    Additional Comments: CM submitted benefit check S/W IRENE @ [employer] # [phone]   TIKOSYN 250 MCG    AND      500 MCG   BID 30 DAY SUPPLY   COVER- YES                  SAME  CO-PAY- $ 60.00               SAME  TIER- E [facility]                 SAME  PRIOR APPROVAL - NO         SAME  PHARMACY- [facility]               SAME   MAIL/ORDER-- $150.00   CM provided info to pt Cherylann Parr, RN 02/24/2015, 11:04 AM

## 2015-02-24 NOTE — Progress Notes (Signed)
Three hours post Tikosyn administration, QT/QTc was 472 ms and 512 ms respectively.

## 2015-02-24 NOTE — Progress Notes (Addendum)
    SUBJECTIVE: The patient is doing well today.  At this time, he denies chest pain, shortness of breath, or any new concerns.  [name] allopurinol  300 mg Oral Daily  . atorvastatin  20 mg Oral q1800  . carvedilol  6.25 mg Oral BID WC  . cholecalciferol  1,000 Units Oral BID  . dabigatran  150 mg Oral BID  . dofetilide  500 mcg Oral BID  . magnesium oxide  400 mg Oral Daily  . multivitamin with minerals  1 tablet Oral Daily  . pantoprazole  40 mg Oral Daily  . sacubitril-valsartan  1 tablet Oral BID  . sodium chloride  3 mL Intravenous Q12H  . spironolactone  12.5 mg Oral Daily      OBJECTIVE: Physical Exam: Filed Vitals:   02/23/15 1505 02/23/15 2002 02/24/15 0507  BP: 119/78 133/93 98/56  Pulse: 71 75 69  Temp: 98.3 F (36.8 C) 98 F (36.7 C) 97.9 F (36.6 C)  TempSrc: Oral Oral Oral  Resp: 18 18 18   Height: 6\' 2"  (1.88 m)    Weight: 252 lb (114.306 kg)    SpO2: 98% 100% 99%   No intake or output data in the 24 hours ending 02/24/15 2542  Telemetry reveals V paced rhythm, underlying rhythm is difficult to establish  GEN- The patient is well appearing, alert and oriented x 3 today.   Head- normocephalic, atraumatic Eyes-  Sclera clear, conjunctiva pink Ears- hearing intact Neck- supple, no JVP Lungs- Clear to ausculation bilaterally, normal work of breathing Heart- Regular rate and rhythm (paced), no significant murmurs, no rubs or gallops GI- soft, NT, ND, + BS Extremities- no clubbing, cyanosis, or edema Skin- no rash or lesion Psych- euthymic mood, full affect Neuro- no gross deficits appreciated  LABS: Basic Metabolic Panel:  Recent Labs  70/62/37 1705 02/24/15 0240  NA 141 140  K 3.9 4.0  CL 104 105  CO2 28 28  GLUCOSE 117* 106*  BUN 9 9  CREATININE 0.69 0.69  CALCIUM 9.2 8.8*  MG 2.1 2.0     ASSESSMENT AND PLAN:   1. Symptomatic atrial arrhythmias (fibrillation and atypical flutter) Admitted for Tikosyn load yesterday, one dose given so  far K+ 4.0, Mag 2.0, renal function stable EKG is reviewed by Dr. Johney Frame, QTc stable to continue same dose/load Continue Pradaxa for CHADS2VASC of at least 1 - he reports compliance without interruption for the past 4 weeks  2. NICM/MDT CRT ICD Euvolemic on exam Continue current medications CXR today to evaluate lead placement, check device  3. ETOH abuse Improved recently Cessation advised   Francis Dowse, PA-C 02/24/2015 6:42 AM  I have seen, examined the patient, and reviewed the above assessment and plan.  On exam, comfortable appearing.  Changes to above are made where necessary.  BIV ICD interrogation is reviewed and normal.  Reveals that he is in afib.  Co Sign: [name], MD 02/24/2015 12:59 PM

## 2015-02-24 NOTE — Discharge Instructions (Addendum)
Information on my medicine - Pradaxa (dabigatran)  This medication education was reviewed with me or my healthcare representative as part of my discharge preparation.  The pharmacist that spoke with me during my hospital stay was:  Almon Hercules, [facility]  Why was Pradaxa prescribed for you? Pradaxa was prescribed for you to reduce the risk of forming blood clots that cause a stroke if you have a medical condition called atrial fibrillation (a type of irregular heartbeat).    What do you Need to know about PradAXa? Take your Pradaxa TWICE DAILY - one capsule in the morning and one tablet in the evening with or without food.  It would be best to take the doses about the same time each day.  The capsules should not be broken, chewed or opened - they must be swallowed whole.  Do not store Pradaxa in other medication containers - once the bottle is opened the Pradaxa should be used within FOUR months; throw away any capsules that havent been by that time.  Take Pradaxa exactly as prescribed by your doctor.  DO NOT stop taking Pradaxa without talking to the doctor who prescribed the medication.  Stopping without other stroke prevention medication to take the place of Pradaxa may increase your risk of developing a clot that causes a stroke.  Refill your prescription before you run out.  After discharge, you should have regular check-up appointments with your healthcare provider that is prescribing your Pradaxa.  In the future your dose may need to be changed if your kidney function or weight changes by a significant amount.  What do you do if you miss a dose? If you miss a dose, take it as soon as you remember on the same day.  If your next dose is less than 6 hours away, skip the missed dose.  Do not take two doses of PRADAXA at the same time.  Important Safety Information A possible side effect of Pradaxa is bleeding. You should call your healthcare provider right away if you experience any  of the following: ? Bleeding from an injury or your nose that does not stop. ? Unusual colored urine (red or dark brown) or unusual colored stools (red or black). ? Unusual bruising for unknown reasons. ? A serious fall or if you hit your head (even if there is no bleeding).  Some medicines may interact with Pradaxa and might increase your risk of bleeding or clotting while on Pradaxa. To help avoid this, consult your healthcare provider or pharmacist prior to using any new prescription or non-prescription medications, including herbals, vitamins, non-steroidal anti-inflammatory drugs (NSAIDs) and supplements.  This website has more information on Pradaxa (dabigatran): https://www.pradaxa.com    You have an appointment set up with the Atrial Fibrillation Clinic.  Multiple studies have shown that being followed by a dedicated atrial fibrillation clinic in addition to the standard care you receive from your other physicians improves health. We believe that enrollment in the atrial fibrillation clinic will allow [place] to better care for you.   The phone number to the Atrial Fibrillation Clinic is [phone]. The clinic is staffed Monday through Friday from 8:30am to 5pm.  Parking Directions: The clinic is located in the Heart and Vascular Building connected to [facility]. 1)From [address] turn on to [address] and go to the 3rd entrance  (Heart and Vascular entrance) on the right. 2)Look to the right for Heart &Vascular Parking Garage. 3)A code for the entrance is required please call  the clinic to receive this.   4)Take the elevators to the 1st floor. Registration is in the room with the glass walls at the end of the hallway.  If you have any trouble parking or locating the clinic, please dont hesitate to call [phone].

## 2015-02-25 ENCOUNTER — Inpatient Hospital Stay (HOSPITAL_COMMUNITY): Payer: 59 | Admitting: Anesthesiology

## 2015-02-25 ENCOUNTER — Encounter (HOSPITAL_COMMUNITY)
Admission: AD | Disposition: A | Discharge status: Home / Self Care | Payer: Self-pay | Source: Ambulatory Visit | Attending: Internal Medicine

## 2015-02-25 ENCOUNTER — Encounter (HOSPITAL_COMMUNITY): Payer: Self-pay | Admitting: Internal Medicine

## 2015-02-25 DIAGNOSIS — Z95810 Presence of automatic (implantable) cardiac defibrillator: Secondary | ICD-10-CM | POA: Insufficient documentation

## 2015-02-25 HISTORY — PX: CARDIOVERSION: SHX1299

## 2015-02-25 LAB — BASIC METABOLIC PANEL
ANION GAP: 8 (ref 5–15)
BUN: 11 mg/dL (ref 6–20)
CHLORIDE: 105 mmol/L (ref 101–111)
CO2: 27 mmol/L (ref 22–32)
Calcium: 8.9 mg/dL (ref 8.9–10.3)
Creatinine, Ser: 0.65 mg/dL (ref 0.61–1.24)
GFR calc Af Amer: >60 mL/min (ref >60)
GFR calc non Af Amer: >60 mL/min (ref >60)
Glucose, Bld: 104 mg/dL — ABNORMAL HIGH (ref 65–99)
POTASSIUM: 4 mmol/L (ref 3.5–5.1)
SODIUM: 140 mmol/L (ref 135–145)

## 2015-02-25 LAB — MAGNESIUM: Magnesium: 2 mg/dL (ref 1.7–2.4)

## 2015-02-25 SURGERY — CARDIOVERSION
Anesthesia: Monitor Anesthesia Care

## 2015-02-25 MED ORDER — SODIUM CHLORIDE 0.9 % IV SOLN: INTRAVENOUS | Status: DC

## 2015-02-25 MED ORDER — LIDOCAINE HCL (CARDIAC) 20 MG/ML IV SOLN
INTRAVENOUS | Status: DC | PRN
  Administered 2015-02-25: 100 mg via INTRAVENOUS

## 2015-02-25 MED ORDER — HYDROCORTISONE 1 % EX CREA
TOPICAL_CREAM | CUTANEOUS | Status: DC | PRN
  Filled 2015-02-25: qty 28

## 2015-02-25 MED ORDER — PROPOFOL 10 MG/ML IV BOLUS
INTRAVENOUS | Status: DC | PRN
  Administered 2015-02-25: 130 mg via INTRAVENOUS

## 2015-02-25 NOTE — Anesthesia Postprocedure Evaluation (Signed)
Anesthesia Post Note  Patient: Donald Grant  Procedure(s) Performed: Procedure(s) (LRB): CARDIOVERSION (N/A)  Patient location during evaluation: Endoscopy Anesthesia Type: MAC Level of consciousness: awake and alert and oriented Pain management: pain level controlled Vital Signs Assessment: post-procedure vital signs reviewed and stable Respiratory status: spontaneous breathing Cardiovascular status: stable Postop Assessment: no signs of nausea or vomiting Anesthetic complications: no    Last Vitals:  Filed Vitals:   02/25/15 1346 02/25/15 1348  BP:    Pulse: 70 65  Temp:    Resp: 22 16    Last Pain: There were no vitals filed for this visit.               De Nurse

## 2015-02-25 NOTE — CV Procedure (Signed)
    CARDIOVERSION NOTE  Procedure: Electrical Cardioversion Indications:  Atrial Fibrillation  Procedure Details:  Consent: Risks of procedure as well as the alternatives and risks of each were explained to the (patient/caregiver).  Consent for procedure obtained.  Time Out: Verified patient identification, verified procedure, site/side was marked, verified correct patient position, special equipment/implants available, medications/allergies/relevent history reviewed, required imaging and test results available.  Performed  Patient placed on cardiac monitor, pulse oximetry, supplemental oxygen as necessary.  Sedation given: Propofol per anesthesia Pacer pads placed anterior and posterior chest.  Cardioverted 1 time(s).  Cardioverted at 120J biphasic.  Impression: Findings: Post procedure EKG shows: AV sequential pacing Complications: None Patient did tolerate procedure well.  Plan: 1. Successful DCCV to AV sequential paced rhythm with a single 120J biphasic shock.  Time Spent Directly with the Patient:  45 minutes   [name], MD, [facility] Attending Cardiologist [facility]  [name] 02/25/2015, 1:52 PM

## 2015-02-25 NOTE — Anesthesia Preprocedure Evaluation (Addendum)
Anesthesia Evaluation  Patient identified by MRN, date of birth, ID band Patient awake    Reviewed: Allergy & Precautions, NPO status , Patient's Chart, lab work & pertinent test results  Airway Mallampati: II  TM Distance: >3 FB     Dental  (+) Teeth Intact, Dental Advisory Given   Pulmonary former smoker,    Pulmonary exam normal        Cardiovascular hypertension, Pt. on medications +CHF  Normal cardiovascular exam+ dysrhythmias Atrial Fibrillation + Cardiac Defibrillator      Neuro/Psych    GI/Hepatic PUD, GERD  Medicated and Controlled,  Endo/Other    Renal/GU      Musculoskeletal   Abdominal   Peds  Hematology   Anesthesia Other Findings   Reproductive/Obstetrics                            Anesthesia Physical Anesthesia Plan  ASA: III  Anesthesia Plan: General   Post-op Pain Management:    Induction: Intravenous  Airway Management Planned: Mask  Additional Equipment:   Intra-op Plan:   Post-operative Plan:   Informed Consent: I have reviewed the patients History and Physical, chart, labs and discussed the procedure including the risks, benefits and alternatives for the proposed anesthesia with the patient or authorized representative who has indicated his/her understanding and acceptance.     Plan Discussed with: CRNA and Surgeon  Anesthesia Plan Comments:         Anesthesia Quick Evaluation

## 2015-02-25 NOTE — H&P (Signed)
    INTERVAL PROCEDURE H&P  History and Physical Interval Note:  02/25/2015 12:47 PM  Donald Grant has presented today for their planned procedure. The various methods of treatment have been discussed with the patient and family. After consideration of risks, benefits and other options for treatment, the patient has consented to the procedure.  The patients' outpatient history has been reviewed, patient examined, and no change in status from most recent office note within the past 30 days. I have reviewed the patients' chart and labs and will proceed as planned. Questions were answered to the patient's satisfaction.   [name], MD, [facility] Attending Cardiologist [facility]  [name] 02/25/2015, 12:47 PM

## 2015-02-25 NOTE — Progress Notes (Signed)
    SUBJECTIVE: The patient is doing well today.  At this time, he denies chest pain, shortness of breath, or any new concerns.  [name] allopurinol  300 mg Oral Daily  . atorvastatin  20 mg Oral q1800  . carvedilol  6.25 mg Oral BID WC  . cholecalciferol  1,000 Units Oral BID  . dabigatran  150 mg Oral BID  . dofetilide  500 mcg Oral BID  . magnesium oxide  400 mg Oral Daily  . multivitamin with minerals  1 tablet Oral Daily  . pantoprazole  40 mg Oral Daily  . sacubitril-valsartan  1 tablet Oral BID  . sodium chloride  3 mL Intravenous Q12H  . spironolactone  12.5 mg Oral Daily   . sodium chloride      OBJECTIVE: Physical Exam: Filed Vitals:   02/24/15 0800 02/24/15 1409 02/24/15 1944 02/25/15 0647  BP: 105/62 113/77 118/75 119/77  Pulse:  72 70 76  Temp:  97.7 F (36.5 C) 98.2 F (36.8 C) 97.6 F (36.4 C)  TempSrc:  Oral Oral Oral  Resp:  18 18 18   Height:      Weight:      SpO2:  99% 97% 97%    Intake/Output Summary (Last 24 hours) at 02/25/15 1019 Last data filed at 02/25/15 0902  Gross per 24 hour  Intake    240 ml  Output      0 ml  Net    240 ml    Telemetry reveals V paced rhythm, underlying rhythm is atrial tachycardia  GEN- The patient is well appearing, alert and oriented x 3 today.   Head- normocephalic, atraumatic Eyes-  Sclera clear, conjunctiva pink Ears- hearing intact Neck- supple, no JVP Lungs- Clear to ausculation bilaterally, normal work of breathing Heart- Regular rate and rhythm (paced), no significant murmurs, no rubs or gallops GI- soft, NT, ND, + BS Extremities- no clubbing, cyanosis, or edema Skin- no rash or lesion Psych- euthymic mood, full affect Neuro- no gross deficits appreciated  LABS: Basic Metabolic Panel:  Recent Labs  50/35/46 0240 02/25/15 0252  NA 140 140  K 4.0 4.0  CL 105 105  CO2 28 27  GLUCOSE 106* 104*  BUN 9 11  CREATININE 0.69 0.65  CALCIUM 8.8* 8.9  MG 2.0 2.0     ASSESSMENT AND PLAN:   1.  Symptomatic atrial arrhythmias (fibrillation and atypical flutter) Admitted for Tikosyn load Tuesday  maintaing K >3.9 and Mg >1.9 Continue Pradaxa for CHADS2VASC of at least 1 QT is stable Planned for cardioversion later today.  Risks of procedure discussed with patient who wishes to proceed.  2. NICM/MDT CRT ICD Euvolemic on exam Continue current medications Device interrogation reviewed yesterday.  His-Bundle pacing.  3. ETOH abuse Improved recently Cessation advised  Hope to discharge tomorrow.  [name], MD 02/25/2015 10:19 AM

## 2015-02-25 NOTE — Transfer of Care (Signed)
Immediate Anesthesia Transfer of Care Note  Patient: Donald Grant  Procedure(s) Performed: Procedure(s): CARDIOVERSION (N/A)  Patient Location: Endoscopy Unit  Anesthesia Type:MAC  Level of Consciousness: awake, alert  and oriented  Airway & Oxygen Therapy: Patient Spontanous Breathing  Post-op Assessment: Report given to RN  Post vital signs: Reviewed and stable  Last Vitals:  Filed Vitals:   02/25/15 1346 02/25/15 1348  BP:    Pulse: 70 65  Temp:    Resp: 22 16    Complications: No apparent anesthesia complications

## 2015-02-26 ENCOUNTER — Encounter (HOSPITAL_COMMUNITY): Payer: Self-pay | Admitting: Internal Medicine

## 2015-02-26 DIAGNOSIS — I480 Paroxysmal atrial fibrillation: Principal | ICD-10-CM

## 2015-02-26 LAB — BASIC METABOLIC PANEL
ANION GAP: 9 (ref 5–15)
BUN: 9 mg/dL (ref 6–20)
CHLORIDE: 103 mmol/L (ref 101–111)
CO2: 29 mmol/L (ref 22–32)
Calcium: 8.9 mg/dL (ref 8.9–10.3)
Creatinine, Ser: 0.71 mg/dL (ref 0.61–1.24)
GFR calc non Af Amer: >60 mL/min (ref >60)
GLUCOSE: 109 mg/dL — AB (ref 65–99)
POTASSIUM: 4.3 mmol/L (ref 3.5–5.1)
SODIUM: 141 mmol/L (ref 135–145)

## 2015-02-26 LAB — MAGNESIUM: Magnesium: 1.9 mg/dL (ref 1.7–2.4)

## 2015-02-26 MED ORDER — DOFETILIDE 500 MCG PO CAPS: 500.0000 ug | ORAL_CAPSULE | Freq: Two times a day (BID) | ORAL | Status: DC

## 2015-02-26 MED ORDER — OMEPRAZOLE 20 MG PO CPDR: 20.0000 mg | DELAYED_RELEASE_CAPSULE | Freq: Two times a day (BID) | ORAL | Status: DC

## 2015-02-26 NOTE — Discharge Summary (Signed)
ELECTROPHYSIOLOGY PROCEDURE DISCHARGE SUMMARY    Patient ID: Donald Grant,  MRN: [id number], DOB/AGE: 40/40/1977 40 y.o.  Admit date: 02/23/2015 Discharge date: 02/26/2015  Primary Care Physician: [name], MD Electrophysiologist: Dr. Johney Frame, Dr. [name] ([facility])  Primary Discharge Diagnosis:  1.  Paroxysmal atrial fibrillation status post Tikosyn loading this admission      CHADS2vasc = 1, on Pradaxa  Secondary Discharge Diagnosis:  1. HLD 2. NICM 3. AFlutter s/p ablation  No Known Allergies   Procedures This Admission:  1.  Tikosyn loading 2.  Direct current cardioversion on 02/25/15 by Dr Rennis Golden which successfully restored rhythm to AV paced.  There were no early apparent complications.   Brief HPI: Donald Grant is a 39 y.o. male with a past medical history as noted above.  They were referred to EP in the outpatient setting for treatment options of atrial fibrillation.  Risks, benefits, and alternatives to Tikosyn were reviewed with the patient who wished to proceed.    Hospital Course:  The patient was admitted and Tikosyn was initiated.  Renal function and electrolytes were followed during the hospitalization.  Their QTc remained stable.  On 02/25/15 they underwent direct current cardioversion which restored his rhythm to AV paced.  They were monitored until discharge on telemetry which demonstrated AV paced rhythm.  On the day of discharge, they were examined by Dr [name] who considered him stable for discharge to home.  Follow-up has been arranged with AFib clinic in 1 week and in 4 weeks, Dr. Johney Frame in 2 months.   Physical Exam: Filed Vitals:   02/25/15 1410 02/25/15 1511 02/25/15 2033 02/26/15 0537  BP: 115/86 121/82 133/83 105/60  Pulse: 70 70 69 70  Temp:  98.3 F (36.8 C) 98.2 F (36.8 C) 97.5 F (36.4 C)  TempSrc:  Oral Oral Oral  Resp: Height:      Weight:      SpO2: 94% 97% 97% 99%    GEN- The patient is well appearing, alert  and oriented x 3 today.   HEENT: normocephalic, atraumatic; sclera clear, conjunctiva pink; hearing intact; oropharynx clear; neck supple, no JVP Lymph- no cervical lymphadenopathy Lungs- Clear to ausculation bilaterally, normal work of breathing.  No wheezes, rales, rhonchi Heart- Regular rate and rhythm, no murmurs, rubs or gallops GI- soft, non-tender, non-distended Extremities- no clubbing, cyanosis, or edema MS- no significant deformity or atrophy Skin- warm and dry, no rash or lesion Psych- euthymic mood, full affect Neuro- strength and sensation are intact   Labs:   Lab Results  Component Value Date   WBC 8.7 05/01/2014   HGB 15.7 05/01/2014   HCT 44.3 05/01/2014   MCV 89.5 05/01/2014   PLT 220 05/01/2014     Recent Labs Lab 02/26/15 0501  NA 141  K 4.3  CL 103  CO2 29  BUN 9  CREATININE 0.71  CALCIUM 8.9  GLUCOSE 109*     Discharge Medications:    Medication List    TAKE these medications        acyclovir 400 MG tablet  Commonly known as:  ZOVIRAX  Take 400 mg by mouth 2 (two) times daily as needed (outbreak).     allopurinol 300 MG tablet  Commonly known as:  ZYLOPRIM  Take 300 mg by mouth every morning.     ALPRAZolam 0.5 MG tablet  Commonly known as:  XANAX  Take 0.5 tablets by mouth daily as needed. anxiety  atorvastatin 20 MG tablet  Commonly known as:  LIPITOR  Take 20 mg by mouth every evening.     b complex vitamins capsule  Take 1 capsule by mouth every evening.     carvedilol 6.25 MG tablet  Commonly known as:  COREG  Take 1 tablet (6.25 mg total) by mouth 2 (two) times daily.     cholecalciferol 1000 UNITS tablet  Commonly known as:  VITAMIN D  Take 1,000 Units by mouth every evening.     dofetilide 500 MCG capsule  Commonly known as:  TIKOSYN  Take 1 capsule (500 mcg total) by mouth 2 (two) times daily.     fish oil-omega-3 fatty acids 1000 MG capsule  Take 2 g by mouth every evening.     indomethacin 25 MG capsule    Commonly known as:  INDOCIN  Take 25 mg by mouth 3 (three) times daily as needed. gout     magnesium oxide 400 MG tablet  Commonly known as:  MAG-OX  Take 1 tablet by mouth daily.     multivitamin with minerals Tabs tablet  Take 1 tablet by mouth every evening.     omeprazole 20 MG capsule  Commonly known as:  PRILOSEC  Take 1 capsule (20 mg total) by mouth 2 (two) times daily.     PRADAXA 150 MG Caps capsule  Generic drug:  dabigatran  TAKE ONE CAPSULE BY MOUTH TWICE A DAY     psyllium 58.6 % packet  Commonly known as:  METAMUCIL  Take 1 packet by mouth daily.     sacubitril-valsartan 49-51 MG  Commonly known as:  ENTRESTO  Take 1 tablet by mouth 2 (two) times daily.     spironolactone 25 MG tablet  Commonly known as:  ALDACTONE  TAKE 1/2 TABLET BY MOUTH ONCE DAILY        Disposition:  Discharge Instructions    Diet - low sodium heart healthy    Complete by:  As directed      Increase activity slowly    Complete by:  As directed           Follow-up Information    Follow up with [facility] On 03/05/2015.   Specialty:  Cardiology   Why:  9:30AM   Contact information:   [address] [id number] mc [place] [address] [phone]      Follow up with [facility] On 03/26/2015.   Specialty:  Cardiology   Why:  9:30AM   Contact information:   [address] [id number] mc [place] [address] [phone]      Follow up with [name], MD On 04/26/2015.   Specialty:  Cardiology   Why:  9:15AM   Contact information:   [address] [place] [address] [phone]       Duration of Discharge Encounter: Greater than 30 minutes including physician time.  Norma Fredrickson, PA-C 02/26/2015 2:03 PM    EP Attending  Patient seen and examined. Agree with above. He is stable for discharge and QT is ok.   Leonia Reeves.D.

## 2015-03-05 ENCOUNTER — Ambulatory Visit (HOSPITAL_COMMUNITY)
Admit: 2015-03-05 | Discharge: 2015-03-05 | Disposition: A | Discharge status: Home / Self Care | Payer: 59 | Source: Ambulatory Visit | Attending: Nurse Practitioner | Admitting: Nurse Practitioner

## 2015-03-05 ENCOUNTER — Encounter (HOSPITAL_COMMUNITY): Payer: Self-pay | Admitting: Nurse Practitioner

## 2015-03-05 ENCOUNTER — Ambulatory Visit: Payer: 59 | Admitting: Pharmacist

## 2015-03-05 VITALS — BP 114/82 | HR 70 | Ht 74.0 in | Wt 253.6 lb

## 2015-03-05 DIAGNOSIS — I429 Cardiomyopathy, unspecified: Secondary | ICD-10-CM | POA: Diagnosis not present

## 2015-03-05 DIAGNOSIS — I4819 Other persistent atrial fibrillation: Secondary | ICD-10-CM

## 2015-03-05 DIAGNOSIS — I4891 Unspecified atrial fibrillation: Secondary | ICD-10-CM | POA: Diagnosis not present

## 2015-03-05 DIAGNOSIS — I481 Persistent atrial fibrillation: Secondary | ICD-10-CM

## 2015-03-05 LAB — BASIC METABOLIC PANEL
Anion gap: 7 (ref 5–15)
BUN: 8 mg/dL (ref 6–20)
CHLORIDE: 105 mmol/L (ref 101–111)
CO2: 26 mmol/L (ref 22–32)
CREATININE: 0.64 mg/dL (ref 0.61–1.24)
Calcium: 9.3 mg/dL (ref 8.9–10.3)
GFR calc non Af Amer: >60 mL/min (ref >60)
Glucose, Bld: 105 mg/dL — ABNORMAL HIGH (ref 65–99)
POTASSIUM: 4.3 mmol/L (ref 3.5–5.1)
Sodium: 138 mmol/L (ref 135–145)

## 2015-03-05 LAB — MAGNESIUM: Magnesium: 2 mg/dL (ref 1.7–2.4)

## 2015-03-05 NOTE — Progress Notes (Signed)
Patient ID: [name], male   DOB: 1975/05/06, 39 y.o.   MRN: [id number]    Primary Care Physician: [name], MD Referring Physician: [facility] f/u Electrophysiologist: Dr. Renee Rival Donald Grant is a 39 y.o. male with a h/o persistent afib, NICM, S/p BiV PPM, for f/u in the afib clinic after recent hospitalization for tikosyn loading. QTc remained stable, He had successful DCCV. He has no complaints, has not noticed any afib. Device interrogated and does not show any afib since d/c from hospital. He understands importance of taking drug on a regular basis and not to miss doses.  Aware of more common drugs not to combine with tikosyn. QTc today is stable.  Today, he denies symptoms of palpitations, chest pain, shortness of breath, orthopnea, PND, lower extremity edema, dizziness, presyncope, syncope, or neurologic sequela. The patient is tolerating medications without difficulties and is otherwise without complaint today.   Past Medical History  Diagnosis Date  . GERD (gastroesophageal reflux disease)   . Dyslipidemia   . Atrial flutter [facility])     CTI ablation by Dr [name] 02/24/11  . Atrial tachycardia (HCC)     mapped to AV node by Dr [name], ablation not performed  . Atrial fibrillation (HCC)     PVI ablatioin at [facility] 04/21/14 and May 2016 Dr. [name]  . Premature ventricular contraction   . First degree AV block   . Gout     bilateral feet  . Lymphadenopathy of right cervical region ?Mono 04/16/2013  . Hx of cardiovascular stress test     Lexiscan Myoview (07/2013):  Inf and inferolateral scar, no ischemia, no gated  . Cardiomyopathy, nonischemic (HCC)     Medtronic BIVE ICD    Past Surgical History  Procedure Laterality Date  . Root canal    . Mouth surgery    . Atrial ablation surgery  02/24/11    CTI ablation by Dr [name]  . Cardioversion (bedside)  03/29/2011       . Cardioversion  03/29/2011    Procedure: CARDIOVERSION;  Surgeon: [name], MD;  Location: [facility]  OR;  Service: Cardiovascular;  Laterality: N/A;  . Examination under anesthesia  12/19/12    Anal Fistula  . Atrial flutter ablation N/A 02/24/2011    Procedure: ATRIAL FLUTTER ABLATION;  Surgeon: [name], MD;  Location: [facility] CATH LAB;  Service: Cardiovascular;  Laterality: N/A;  . Atrial fibrillation ablation  04/24/14, 08/13/14    PVI at [facility] by Dr [name], PVI with posterior wall Box and FIRM ablation performed  . Cardioversion N/A 02/25/2015    Procedure: CARDIOVERSION;  Surgeon: [name], MD;  Location: [facility] ENDOSCOPY;  Service: Cardiovascular;  Laterality: N/A;    Current Outpatient Prescriptions  Medication Sig Dispense Refill  . acyclovir (ZOVIRAX) 400 MG tablet Take 400 mg by mouth 2 (two) times daily as needed (outbreak).     [name] allopurinol (ZYLOPRIM) 300 MG tablet Take 300 mg by mouth every morning.     [name] ALPRAZolam (XANAX) 0.5 MG tablet Take 0.5 tablets by mouth daily as needed. anxiety  1  . atorvastatin (LIPITOR) 20 MG tablet Take 20 mg by mouth every evening.     [name] b complex vitamins capsule Take 1 capsule by mouth every evening.     . carvedilol (COREG) 6.25 MG tablet Take 1 tablet (6.25 mg total) by mouth 2 (two) times daily. 180 tablet 3  . cholecalciferol (VITAMIN D) 1000 UNITS tablet Take 1,000 Units by  mouth every evening.     . dofetilide (TIKOSYN) 500 MCG capsule Take 1 capsule (500 mcg total) by mouth 2 (two) times daily. 60 capsule 3  . fish oil-omega-3 fatty acids 1000 MG capsule Take 2 g by mouth every evening.     . indomethacin (INDOCIN) 25 MG capsule Take 25 mg by mouth 3 (three) times daily as needed. gout    . magnesium oxide (MAG-OX) 400 MG tablet Take 1 tablet by mouth daily.    . Multiple Vitamin (MULITIVITAMIN WITH MINERALS) TABS Take 1 tablet by mouth every evening.     [name] PRADAXA 150 MG CAPS capsule TAKE ONE CAPSULE BY MOUTH TWICE A DAY 60 capsule 11  . psyllium (METAMUCIL) 58.6 % packet Take 1 packet by mouth daily.    . sacubitril-valsartan  (ENTRESTO) 49-51 MG Take 1 tablet by mouth 2 (two) times daily. 60 tablet 6  . spironolactone (ALDACTONE) 25 MG tablet TAKE 1/2 TABLET BY MOUTH ONCE DAILY 15 tablet 3   No current facility-administered medications for this encounter.    No Known Allergies  Social History   Social History  . Marital Status: Married    Spouse Name: N/A  . Number of Children: N/A  . Years of Education: N/A   Occupational History  . Not on file.   Social History Main Topics  . Smoking status: Former Smoker    Quit date: 08/28/2010  . Smokeless tobacco: Never Used  . Alcohol Use: No     Comment: quit 6 months ago  . Drug Use: Yes    Special: Marijuana     Comment: still currently smoking  . Sexual Activity: Yes   Other Topics Concern  . Not on file   Social History Narrative   His additional social history is notable that the patient exercises     several times a week, jogging up to 2 miles a day.  With exercise, he     notes that he has lost over 10 pounds.     Family History  Problem Relation Age of Onset  . Heart disease Mother   . Achondroplasia Mother   . Alcohol abuse Mother   . Achondroplasia Son   . Sudden death Mother   . Sudden death Maternal Aunt   . Sudden death Maternal Grandmother   . Dilated cardiomyopathy Mother   . Other Maternal Aunt     substance abuse    ROS- All systems are reviewed and negative except as per the HPI above  Physical Exam: Filed Vitals:   03/05/15 0939  BP: 114/82  Pulse: 70  Height:  (1.88 m)  Weight: 253 lb 9.6 oz (115.032 kg)    GEN- The patient is well appearing, alert and oriented x 3 today.   Head- normocephalic, atraumatic Eyes-  Sclera clear, conjunctiva pink Ears- hearing intact Oropharynx- clear Neck- supple, no JVP Lymph- no cervical lymphadenopathy Lungs- Clear to ausculation bilaterally, normal work of breathing Heart- Regular rate and rhythm, no murmurs, rubs or gallops, PMI not laterally displaced GI- soft,  NT, ND, + BS Extremities- no clubbing, cyanosis, or edema MS- no significant deformity or atrophy Skin- no rash or lesion Psych- euthymic mood, full affect Neuro- strength and sensation are intact  EKG- AV dual paced rhythm, v rate 70 bpm, pr int 174 ms, qrs int 112 ms, qtc 475 bpm. Device interrogated and shows normal function No afib  Assessment and Plan: 1. afib Maintaining SR on tikosyn Qtc stable Knows importance  of taking drug on regular basis and not to combine with other qtc prolonging drugs Bmet/mag today  2. NICM stable  F/u in afib clinic in 4 weeks  Lupita Leash C. [facility] [address] [place], [place] [address] [phone]

## 2015-03-19 ENCOUNTER — Other Ambulatory Visit (HOSPITAL_COMMUNITY): Payer: Self-pay | Admitting: Internal Medicine

## 2015-03-26 ENCOUNTER — Encounter (HOSPITAL_COMMUNITY): Payer: Self-pay | Admitting: Nurse Practitioner

## 2015-03-26 ENCOUNTER — Ambulatory Visit (HOSPITAL_COMMUNITY)
Admit: 2015-03-26 | Discharge: 2015-03-26 | Disposition: A | Discharge status: Home / Self Care | Payer: 59 | Source: Ambulatory Visit | Attending: Nurse Practitioner | Admitting: Nurse Practitioner

## 2015-03-26 VITALS — BP 122/84 | HR 70 | Ht 74.0 in | Wt 258.2 lb

## 2015-03-26 DIAGNOSIS — I481 Persistent atrial fibrillation: Secondary | ICD-10-CM

## 2015-03-26 DIAGNOSIS — I4819 Other persistent atrial fibrillation: Secondary | ICD-10-CM

## 2015-03-26 LAB — BASIC METABOLIC PANEL
ANION GAP: 10 (ref 5–15)
BUN: 15 mg/dL (ref 6–20)
CALCIUM: 8.8 mg/dL — AB (ref 8.9–10.3)
CO2: 24 mmol/L (ref 22–32)
Chloride: 104 mmol/L (ref 101–111)
Creatinine, Ser: 0.77 mg/dL (ref 0.61–1.24)
GFR calc Af Amer: >60 mL/min (ref >60)
GLUCOSE: 112 mg/dL — AB (ref 65–99)
POTASSIUM: 4 mmol/L (ref 3.5–5.1)
SODIUM: 138 mmol/L (ref 135–145)

## 2015-03-26 LAB — MAGNESIUM: MAGNESIUM: 2.1 mg/dL (ref 1.7–2.4)

## 2015-03-26 NOTE — Progress Notes (Signed)
Patient ID: Donald Grant, male   DOB: 1975-08-27, 39 y.o.   MRN: [id number]    Primary Care Physician: [name], MD Referring Physician: [facility] f/u Electrophysiologist: Dr. Renee Rival Donald Grant is a 39 y.o. male with a h/o persistent afib, NICM, S/p BiV PPM,  f/u in the afib clinic. 12/9, after recent hospitalization for tikosyn loading. QTc remained stable, He had successful DCCV. He had no complaints, had not noticed any afib. Device interrogated and did not show any afib since d/c from hospital. He understood importance of taking drug on a regular basis and not to miss doses.  Aware of more common drugs not to combine with tikosyn. QTc  Stable.  He returns 12/30 and continues to do well. Has not noticed any heart rhythm issues. Good energy, no shortness of breath.  Today, he denies symptoms of palpitations, chest pain, shortness of breath, orthopnea, PND, lower extremity edema, dizziness, presyncope, syncope, or neurologic sequela. The patient is tolerating medications without difficulties and is otherwise without complaint today.   Past Medical History  Diagnosis Date  . GERD (gastroesophageal reflux disease)   . Dyslipidemia   . Atrial flutter [facility])     CTI ablation by Dr [name] 02/24/11  . Atrial tachycardia (HCC)     mapped to AV node by Dr [name], ablation not performed  . Atrial fibrillation (HCC)     PVI ablatioin at [facility] 04/21/14 and May 2016 Dr. [name]  . Premature ventricular contraction   . First degree AV block   . Gout     bilateral feet  . Lymphadenopathy of right cervical region ?Mono 04/16/2013  . Hx of cardiovascular stress test     Lexiscan Myoview (07/2013):  Inf and inferolateral scar, no ischemia, no gated  . Cardiomyopathy, nonischemic (HCC)     Medtronic BIVE ICD    Past Surgical History  Procedure Laterality Date  . Root canal    . Mouth surgery    . Atrial ablation surgery  02/24/11    CTI ablation by Dr [name]  . Cardioversion (bedside)   03/29/2011       . Cardioversion  03/29/2011    Procedure: CARDIOVERSION;  Surgeon: [name], MD;  Location: [facility] OR;  Service: Cardiovascular;  Laterality: N/A;  . Examination under anesthesia  12/19/12    Anal Fistula  . Atrial flutter ablation N/A 02/24/2011    Procedure: ATRIAL FLUTTER ABLATION;  Surgeon: [name], MD;  Location: [facility] CATH LAB;  Service: Cardiovascular;  Laterality: N/A;  . Atrial fibrillation ablation  04/24/14, 08/13/14    PVI at [facility] by Dr [name], PVI with posterior wall Box and FIRM ablation performed  . Cardioversion N/A 02/25/2015    Procedure: CARDIOVERSION;  Surgeon: [name], MD;  Location: [facility] ENDOSCOPY;  Service: Cardiovascular;  Laterality: N/A;    Current Outpatient Prescriptions  Medication Sig Dispense Refill  . acyclovir (ZOVIRAX) 400 MG tablet Take 400 mg by mouth 2 (two) times daily as needed (outbreak).     [name] allopurinol (ZYLOPRIM) 300 MG tablet Take 300 mg by mouth every morning.     [name] ALPRAZolam (XANAX) 0.5 MG tablet Take 0.5 tablets by mouth daily as needed. anxiety  1  . atorvastatin (LIPITOR) 20 MG tablet Take 20 mg by mouth every evening.     [name] b complex vitamins capsule Take 1 capsule by mouth every evening.     . carvedilol (COREG) 6.25 MG tablet Take 1 tablet (6.25 mg  total) by mouth 2 (two) times daily. 180 tablet 3  . cholecalciferol (VITAMIN D) 1000 UNITS tablet Take 1,000 Units by mouth every evening.     . dofetilide (TIKOSYN) 500 MCG capsule Take 1 capsule (500 mcg total) by mouth 2 (two) times daily. 60 capsule 3  . fish oil-omega-3 fatty acids 1000 MG capsule Take 2 g by mouth every evening.     . indomethacin (INDOCIN) 25 MG capsule Take 25 mg by mouth 3 (three) times daily as needed. gout    . magnesium oxide (MAG-OX) 400 MG tablet Take 1 tablet by mouth daily.    . Multiple Vitamin (MULITIVITAMIN WITH MINERALS) TABS Take 1 tablet by mouth every evening.     [name] PRADAXA 150 MG CAPS capsule TAKE ONE CAPSULE BY MOUTH TWICE A  DAY 60 capsule 11  . psyllium (METAMUCIL) 58.6 % packet Take 1 packet by mouth daily.    . sacubitril-valsartan (ENTRESTO) 49-51 MG Take 1 tablet by mouth 2 (two) times daily. 60 tablet 6  . spironolactone (ALDACTONE) 25 MG tablet TAKE 1/2 TABLET BY MOUTH ONCE DAILY 15 tablet 3  . spironolactone (ALDACTONE) 25 MG tablet TAKE 1/2 TABLET BY MOUTH ONCE DAILY 15 tablet 3   No current facility-administered medications for this encounter.    No Known Allergies  Social History   Social History  . Marital Status: Married    Spouse Name: N/A  . Number of Children: N/A  . Years of Education: N/A   Occupational History  . Not on file.   Social History Main Topics  . Smoking status: Former Smoker    Quit date: 08/28/2010  . Smokeless tobacco: Never Used  . Alcohol Use: No     Comment: quit 6 months ago  . Drug Use: Yes    Special: Marijuana     Comment: still currently smoking  . Sexual Activity: Yes   Other Topics Concern  . Not on file   Social History Narrative   His additional social history is notable that the patient exercises     several times a week, jogging up to 2 miles a day.  With exercise, he     notes that he has lost over 10 pounds.     Family History  Problem Relation Age of Onset  . Heart disease Mother   . Achondroplasia Mother   . Alcohol abuse Mother   . Achondroplasia Son   . Sudden death Mother   . Sudden death Maternal Aunt   . Sudden death Maternal Grandmother   . Dilated cardiomyopathy Mother   . Other Maternal Aunt     substance abuse    ROS- All systems are reviewed and negative except as per the HPI above  Physical Exam: Filed Vitals:   03/26/15 0945  BP: 122/84  Pulse: 70  Height: 6\' 2"  (1.88 m)  Weight: 258 lb 3.2 oz (117.119 kg)    GEN- The patient is well appearing, alert and oriented x 3 today.   Head- normocephalic, atraumatic Eyes-  Sclera clear, conjunctiva pink Ears- hearing intact Oropharynx- clear Neck- supple, no  JVP Lymph- no cervical lymphadenopathy Lungs- Clear to ausculation bilaterally, normal work of breathing Heart- Regular rate and rhythm, no murmurs, rubs or gallops, PMI not laterally displaced GI- soft, NT, ND, + BS Extremities- no clubbing, cyanosis, or edema MS- no significant deformity or atrophy Skin- no rash or lesion Psych- euthymic mood, full affect Neuro- strength and sensation are intact  EKG- AV dual  paced rhythm, v rate 70 bpm, pr int 174 ms, qrs int 114 ms, qtc 464 bpm.    Assessment and Plan: 1. afib Maintaining SR on tikosyn Qtc stable Knows importance of taking drug on regular basis and not to combine with other qtc prolonging drugs Bmet/mag today  2. NICM stable  F/u Dr. Johney Frame 1/30 as scheduled  Elvina Sidle. [facility] [address] [place], [place] [address] [phone]

## 2015-04-26 ENCOUNTER — Ambulatory Visit (INDEPENDENT_AMBULATORY_CARE_PROVIDER_SITE_OTHER): Payer: 59 | Admitting: Internal Medicine

## 2015-04-26 ENCOUNTER — Encounter: Payer: Self-pay | Admitting: Internal Medicine

## 2015-04-26 VITALS — BP 122/78 | HR 70 | Ht 74.0 in | Wt 257.0 lb

## 2015-04-26 DIAGNOSIS — Z95810 Presence of automatic (implantable) cardiac defibrillator: Secondary | ICD-10-CM

## 2015-04-26 DIAGNOSIS — I481 Persistent atrial fibrillation: Secondary | ICD-10-CM | POA: Diagnosis not present

## 2015-04-26 DIAGNOSIS — I429 Cardiomyopathy, unspecified: Secondary | ICD-10-CM

## 2015-04-26 DIAGNOSIS — I4819 Other persistent atrial fibrillation: Secondary | ICD-10-CM

## 2015-04-26 LAB — CUP PACEART INCLINIC DEVICE CHECK
Battery Remaining Longevity: 62 mo
Battery Voltage: 2.97 V
Brady Statistic AS VS Percent: 0.11 %
Brady Statistic RA Percent Paced: 98.58 %
Date Time Interrogation Session: 20170130100750
HIGH POWER IMPEDANCE MEASURED VALUE: 63 Ohm
Implantable Lead Implant Date: 20160628
Implantable Lead Implant Date: 20160628
Implantable Lead Location: 753859
Implantable Lead Model: 3830
Lead Channel Impedance Value: 551 Ohm
Lead Channel Pacing Threshold Pulse Width: 0.4 ms
Lead Channel Sensing Intrinsic Amplitude: 10.75 mV
Lead Channel Sensing Intrinsic Amplitude: 10.75 mV
Lead Channel Setting Pacing Amplitude: 3 V
Lead Channel Setting Pacing Pulse Width: 0.8 ms
Lead Channel Setting Sensing Sensitivity: 0.3 mV
MDC IDC LEAD IMPLANT DT: 20160628
MDC IDC LEAD LOCATION: 753858
MDC IDC LEAD LOCATION: 753860
MDC IDC MSMT LEADCHNL LV IMPEDANCE VALUE: 304 Ohm
MDC IDC MSMT LEADCHNL LV IMPEDANCE VALUE: 304 Ohm
MDC IDC MSMT LEADCHNL LV IMPEDANCE VALUE: 513 Ohm
MDC IDC MSMT LEADCHNL LV PACING THRESHOLD AMPLITUDE: 1.5 V
MDC IDC MSMT LEADCHNL LV PACING THRESHOLD PULSEWIDTH: 0.8 ms
MDC IDC MSMT LEADCHNL RA PACING THRESHOLD AMPLITUDE: 0.5 V
MDC IDC MSMT LEADCHNL RA PACING THRESHOLD PULSEWIDTH: 0.4 ms
MDC IDC MSMT LEADCHNL RA SENSING INTR AMPL: 2.75 mV
MDC IDC MSMT LEADCHNL RA SENSING INTR AMPL: 3.875 mV
MDC IDC MSMT LEADCHNL RV IMPEDANCE VALUE: 361 Ohm
MDC IDC MSMT LEADCHNL RV IMPEDANCE VALUE: 456 Ohm
MDC IDC MSMT LEADCHNL RV PACING THRESHOLD AMPLITUDE: 0.75 V
MDC IDC SET LEADCHNL RA PACING AMPLITUDE: 2 V
MDC IDC SET LEADCHNL RV PACING AMPLITUDE: 2 V
MDC IDC SET LEADCHNL RV PACING PULSEWIDTH: 0.4 ms
MDC IDC STAT BRADY AP VP PERCENT: 98.54 %
MDC IDC STAT BRADY AP VS PERCENT: 0.04 %
MDC IDC STAT BRADY AS VP PERCENT: 1.31 %
MDC IDC STAT BRADY RV PERCENT PACED: 99.33 %

## 2015-04-26 NOTE — Progress Notes (Signed)
Electrophysiology Office Note   Date:  04/26/2015   ID:  [place], [place] 05-Aug-1975, MRN [id number]  PCP:  [name], MD   Primary Electrophysiologist: [name], MD    Chief Complaint  Patient presents with  . Atrial Fibrillation     History of Present Illness: DEMARRI [facility] is a 40 y.o. male who presents today for electrophysiology evaluation.   Doing very well at this time.  Some fatigue with coreg but feels that this is improved.    Today, he denies symptoms of palpitations, exertional chest pain, shortness of breath, orthopnea, PND, lower extremity edema, claudication, dizziness, presyncope, syncope, bleeding, or neurologic sequela. The patient is tolerating medications without difficulties and is otherwise without complaint today.    Past Medical History  Diagnosis Date  . GERD (gastroesophageal reflux disease)   . Dyslipidemia   . Atrial flutter [facility])     CTI ablation by Dr [name] 02/24/11  . Atrial tachycardia (HCC)     mapped to AV node by Dr [name], ablation not performed  . Atrial fibrillation (HCC)     PVI ablatioin at [facility] 04/21/14 and May 2016 Dr. [name]  . Premature ventricular contraction   . First degree AV block   . Gout     bilateral feet  . Lymphadenopathy of right cervical region ?Mono 04/16/2013  . Hx of cardiovascular stress test     Lexiscan Myoview (07/2013):  Inf and inferolateral scar, no ischemia, no gated  . Cardiomyopathy, nonischemic (HCC)     Medtronic BIVE ICD    Past Surgical History  Procedure Laterality Date  . Root canal    . Mouth surgery    . Atrial ablation surgery  02/24/11    CTI ablation by Dr [name]  . Cardioversion (bedside)  03/29/2011       . Cardioversion  03/29/2011    Procedure: CARDIOVERSION;  Surgeon: [name], MD;  Location: [facility] OR;  Service: Cardiovascular;  Laterality: N/A;  . Examination under anesthesia  12/19/12    Anal Fistula  . Atrial flutter ablation N/A 02/24/2011    Procedure:  ATRIAL FLUTTER ABLATION;  Surgeon: [name], MD;  Location: [facility] CATH LAB;  Service: Cardiovascular;  Laterality: N/A;  . Atrial fibrillation ablation  04/24/14, 08/13/14    PVI at [facility] by Dr [name], PVI with posterior wall Box and FIRM ablation performed  . Cardioversion N/A 02/25/2015    Procedure: CARDIOVERSION;  Surgeon: [name], MD;  Location: [facility] ENDOSCOPY;  Service: Cardiovascular;  Laterality: N/A;     Current Outpatient Prescriptions  Medication Sig Dispense Refill  . acyclovir (ZOVIRAX) 400 MG tablet Take 400 mg by mouth 2 (two) times daily as needed (outbreak).     [name] allopurinol (ZYLOPRIM) 300 MG tablet Take 300 mg by mouth every morning.     [name] ALPRAZolam (XANAX) 0.5 MG tablet Take 0.5 tablets by mouth daily as needed. anxiety  1  . atorvastatin (LIPITOR) 20 MG tablet Take 20 mg by mouth every evening.     [name] b complex vitamins capsule Take 1 capsule by mouth every evening.     . carvedilol (COREG) 6.25 MG tablet Take 1 tablet (6.25 mg total) by mouth 2 (two) times daily. 180 tablet 3  . cholecalciferol (VITAMIN D) 1000 UNITS tablet Take 1,000 Units by mouth every evening.     . dofetilide (TIKOSYN) 500 MCG capsule Take 1 capsule (500 mcg total) by mouth 2 (two) times daily. 60 capsule  3  . fish oil-omega-3 fatty acids 1000 MG capsule Take 2 g by mouth every evening.     . indomethacin (INDOCIN) 25 MG capsule Take 25 mg by mouth 3 (three) times daily as needed. gout    . magnesium oxide (MAG-OX) 400 MG tablet Take 1 tablet by mouth daily.    . Multiple Vitamin (MULITIVITAMIN WITH MINERALS) TABS Take 1 tablet by mouth every evening.     [name] PRADAXA 150 MG CAPS capsule TAKE ONE CAPSULE BY MOUTH TWICE A DAY 60 capsule 11  . psyllium (METAMUCIL) 58.6 % packet Take 1 packet by mouth daily.    . sacubitril-valsartan (ENTRESTO) 49-51 MG Take 1 tablet by mouth 2 (two) times daily. 60 tablet 6  . spironolactone (ALDACTONE) 25 MG tablet TAKE 1/2 TABLET BY MOUTH ONCE DAILY 15 tablet 3     No current facility-administered medications for this visit.    Allergies:   Review of patient's allergies indicates no known allergies.   Social History:  The patient  reports that he quit smoking about 4 years ago. He has never used smokeless tobacco. He reports that he uses illicit drugs (Marijuana). He reports that he does not drink alcohol.   Family History:  The patient's family history includes Achondroplasia in his mother and son; Alcohol abuse in his mother; Dilated cardiomyopathy in his mother; Heart disease in his mother; Other in his maternal aunt; Sudden death in his maternal aunt, maternal grandmother, and mother.    ROS:  Please see the history of present illness.   All other systems are reviewed and negative.    PHYSICAL EXAM: VS:  BP 122/78 mmHg  Pulse 70  Ht 6\' 2"  (1.88 m)  Wt 257 lb (116.574 kg)  BMI 32.98 kg/m2 , BMI Body mass index is 32.98 kg/(m^2). GEN: Well nourished, well developed, in no acute distress HEENT: normal Neck: no JVD, carotid bruits, or masses Cardiac: RRR; no murmurs, rubs, or gallops,no edema  Respiratory:  clear to auscultation bilaterally, normal work of breathing GI: soft, nontender, nondistended, + BS MS: no deformity or atrophy Skin: warm and dry, device pocket is well healed Neuro:  Strength and sensation are intact Psych: euthymic mood, full affect  EKG:  EKG is ordered today. The ekg ordered today shows AV paced 70 bpm, Qtc 457 msec  Device interrogation is reviewed today in detail.  See PaceArt for details.   Recent Labs: 05/01/2014: B Natriuretic Peptide 38.8; Hemoglobin 15.7; Platelets 220 03/26/2015: BUN 15; Creatinine, Ser 0.77; Magnesium 2.1; Potassium 4.0; Sodium 138    Lipid Panel  No results found for: CHOL, TRIG, HDL, CHOLHDL, VLDL, LDLCALC, LDLDIRECT   Wt Readings from Last 3 Encounters:  04/26/15 257 lb (116.574 kg)  03/26/15 258 lb 3.2 oz (117.119 kg)  03/05/15 253 lb 9.6 oz (115.032 kg)    ASSESSMENT AND  PLAN:  1.  Persistent afib/ atypical atrial flutter No real improvement with prior ablations but is now holding sinus with tikosyn On pradaxa chads2vasc score is 1  2. Nonischemic CM/ sick sinus syndrome, advanced AV block euvolemic today Genetic mutation discovered. Now is s/p MDT BIV ICD (has his bundle lead rather than LV lead) Normal device function  carelink is followed at [facility] Repeat echo at this time Consider reducing lower pacing rate to 60 bpm upon return Rate response turned on today Could also consider programming his pacing only rather than RV in refractoriness in the future once we are sure that his His lead will  remain stable  3. ETOH doing better with this lately  Follow-up with Lupita Leash in the AF clinic in 3 months Will neet bmet, mg at that time Return to see me in 6 months carelink followed by [facility]  Current medicines are reviewed at length with the patient today.   The patient does not have concerns regarding his medicines.  The following changes were made today:  none  Signed, [name], MD  04/26/2015 9:57 AM     [facility] [address] [place] [address] [phone] (office) [phone] (fax)

## 2015-04-26 NOTE — Patient Instructions (Addendum)
Medication Instructions:  Your physician recommends that you continue on your current medications as directed. Please refer to the Current Medication list given to you today.   Labwork: None ordered   Testing/Procedures: Your physician has requested that you have an echocardiogram. Echocardiography is a painless test that uses sound waves to create images of your heart. It provides your doctor with information about the size and shape of your heart and how well your heart's chambers and valves are working. This procedure takes approximately one hour. There are no restrictions for this procedure.    Follow-Up: Your physician recommends that you schedule a follow-up appointment in: 3 months with Rudi Coco, NP and 6 months with Dr Johney Frame      Any Other Special Instructions Will Be Listed Below (If Applicable).     If you need a refill on your cardiac medications before your next appointment, please call your pharmacy.

## 2015-05-06 ENCOUNTER — Telehealth: Payer: Self-pay | Admitting: Internal Medicine

## 2015-05-06 NOTE — Telephone Encounter (Signed)
Will forward to Dr Johney Frame to see if he feels needs to be pre-medicated  And to [facility] clinic for Pradaxa hold

## 2015-05-06 NOTE — Telephone Encounter (Signed)
1. What dental office are you calling from? Dr. Ursula Beath   2. What is your office phone and fax number? [phone](SHUOHF) [phone](CEY)  3. What type of procedure is the patient having performed? Teeth cleaning    4. What date is procedure scheduled? 3/1  5. What is your question (ex. Antibiotics prior to procedure, holding medication-we need to know how long dentist wants pt to hold med)?  Does the pt need to be pre-medicated..he is on tikosyn and pradaxa 150mg  6.

## 2015-05-06 NOTE — Telephone Encounter (Signed)
Spoke with Carollee Herter at the dental office and she has requested this be faxed to [phone].

## 2015-05-06 NOTE — Telephone Encounter (Signed)
No need for pre medication

## 2015-05-06 NOTE — Telephone Encounter (Signed)
Pt does not need to come off his Pradaxa for a dental cleaning. Please have him continue his Pradaxa as well as his Tikosyn.

## 2015-05-07 ENCOUNTER — Ambulatory Visit (HOSPITAL_COMMUNITY): Payer: 59 | Attending: Internal Medicine

## 2015-05-07 ENCOUNTER — Other Ambulatory Visit: Payer: Self-pay

## 2015-05-07 DIAGNOSIS — Z95810 Presence of automatic (implantable) cardiac defibrillator: Secondary | ICD-10-CM | POA: Diagnosis not present

## 2015-05-07 DIAGNOSIS — I481 Persistent atrial fibrillation: Secondary | ICD-10-CM | POA: Diagnosis not present

## 2015-05-07 DIAGNOSIS — E785 Hyperlipidemia, unspecified: Secondary | ICD-10-CM | POA: Diagnosis not present

## 2015-05-07 DIAGNOSIS — I4891 Unspecified atrial fibrillation: Secondary | ICD-10-CM | POA: Diagnosis present

## 2015-05-07 DIAGNOSIS — I4819 Other persistent atrial fibrillation: Secondary | ICD-10-CM

## 2015-06-07 ENCOUNTER — Telehealth: Payer: Self-pay | Admitting: Internal Medicine

## 2015-06-07 NOTE — Telephone Encounter (Signed)
Spoke with pt.  He has started adjusting his times by 1 hour each day to account for the time change.  Explained this was fine and if he wanted to go back to his normal dose once he returns to the [facility] he can do so without having to adjust over several days.

## 2015-06-07 NOTE — Telephone Encounter (Signed)
New message      Pt want to ask the pharmacist about taking his tikosyn while on vacation on the [address]  We just had a time change here and he will soon be having another time change.  Questions about when to take his tikosyn.

## 2015-06-14 ENCOUNTER — Other Ambulatory Visit: Payer: Self-pay | Admitting: Physician Assistant

## 2015-06-15 ENCOUNTER — Telehealth: Payer: Self-pay | Admitting: Internal Medicine

## 2015-06-15 MED ORDER — DOFETILIDE 500 MCG PO CAPS: 500.0000 ug | ORAL_CAPSULE | Freq: Two times a day (BID) | ORAL | Status: DC

## 2015-06-15 NOTE — Telephone Encounter (Signed)
Rx sent 

## 2015-06-15 NOTE — Telephone Encounter (Signed)
° °*  STAT* If patient is at the pharmacy, call can be transferred to refill team.   1. Which medications need to be refilled? (please list name of each medication and dose if known) Tikosyn 500 mg  2. Which pharmacy/location (including street and city if local pharmacy) is medication to be sent to? [facility] [address]  3. Do they need a 30 day or 90 day supply? 90 days

## 2015-06-16 ENCOUNTER — Encounter: Payer: Self-pay | Admitting: Internal Medicine

## 2015-06-16 DIAGNOSIS — Z79899 Other long term (current) drug therapy: Secondary | ICD-10-CM | POA: Insufficient documentation

## 2015-06-21 ENCOUNTER — Ambulatory Visit (HOSPITAL_COMMUNITY): Admission: RE | Admit: 2015-06-21 | Payer: 59 | Source: Ambulatory Visit | Admitting: Cardiology

## 2015-06-21 ENCOUNTER — Ambulatory Visit (HOSPITAL_COMMUNITY)
Admission: RE | Admit: 2015-06-21 | Discharge: 2015-06-21 | Disposition: A | Discharge status: Home / Self Care | Payer: 59 | Source: Ambulatory Visit | Attending: Internal Medicine | Admitting: Internal Medicine

## 2015-06-21 ENCOUNTER — Encounter (HOSPITAL_COMMUNITY)
Admission: RE | Disposition: A | Discharge status: Home / Self Care | Payer: Self-pay | Source: Ambulatory Visit | Attending: Internal Medicine

## 2015-06-21 ENCOUNTER — Encounter (HOSPITAL_COMMUNITY): Admission: RE | Payer: Self-pay | Source: Ambulatory Visit

## 2015-06-21 ENCOUNTER — Encounter (HOSPITAL_COMMUNITY): Payer: Self-pay | Admitting: Internal Medicine

## 2015-06-21 DIAGNOSIS — M109 Gout, unspecified: Secondary | ICD-10-CM | POA: Diagnosis not present

## 2015-06-21 DIAGNOSIS — I481 Persistent atrial fibrillation: Secondary | ICD-10-CM

## 2015-06-21 DIAGNOSIS — K219 Gastro-esophageal reflux disease without esophagitis: Secondary | ICD-10-CM | POA: Insufficient documentation

## 2015-06-21 DIAGNOSIS — I4891 Unspecified atrial fibrillation: Secondary | ICD-10-CM | POA: Diagnosis not present

## 2015-06-21 DIAGNOSIS — Z87891 Personal history of nicotine dependence: Secondary | ICD-10-CM | POA: Insufficient documentation

## 2015-06-21 DIAGNOSIS — I493 Ventricular premature depolarization: Secondary | ICD-10-CM | POA: Insufficient documentation

## 2015-06-21 DIAGNOSIS — Z8249 Family history of ischemic heart disease and other diseases of the circulatory system: Secondary | ICD-10-CM | POA: Insufficient documentation

## 2015-06-21 DIAGNOSIS — I4819 Other persistent atrial fibrillation: Secondary | ICD-10-CM | POA: Diagnosis present

## 2015-06-21 DIAGNOSIS — I484 Atypical atrial flutter: Secondary | ICD-10-CM | POA: Diagnosis present

## 2015-06-21 DIAGNOSIS — E785 Hyperlipidemia, unspecified: Secondary | ICD-10-CM | POA: Diagnosis not present

## 2015-06-21 DIAGNOSIS — I440 Atrioventricular block, first degree: Secondary | ICD-10-CM | POA: Insufficient documentation

## 2015-06-21 DIAGNOSIS — Z7901 Long term (current) use of anticoagulants: Secondary | ICD-10-CM | POA: Diagnosis not present

## 2015-06-21 DIAGNOSIS — F1290 Cannabis use, unspecified, uncomplicated: Secondary | ICD-10-CM | POA: Diagnosis not present

## 2015-06-21 DIAGNOSIS — I429 Cardiomyopathy, unspecified: Secondary | ICD-10-CM | POA: Diagnosis not present

## 2015-06-21 HISTORY — PX: ELECTROPHYSIOLOGIC STUDY: SHX172A

## 2015-06-21 LAB — BASIC METABOLIC PANEL
Anion gap: 9 (ref 5–15)
BUN: 8 mg/dL (ref 6–20)
CALCIUM: 9.3 mg/dL (ref 8.9–10.3)
CO2: 23 mmol/L (ref 22–32)
CREATININE: 0.68 mg/dL (ref 0.61–1.24)
Chloride: 108 mmol/L (ref 101–111)
GFR calc Af Amer: >60 mL/min (ref >60)
Glucose, Bld: 115 mg/dL — ABNORMAL HIGH (ref 65–99)
POTASSIUM: 4.1 mmol/L (ref 3.5–5.1)
SODIUM: 140 mmol/L (ref 135–145)

## 2015-06-21 LAB — CBC WITH DIFFERENTIAL/PLATELET
BASOS ABS: 0 10*3/uL (ref 0.0–0.1)
Basophils Relative: 0 %
EOS ABS: 0.3 10*3/uL (ref 0.0–0.7)
EOS PCT: 4 %
HCT: 42.2 % (ref 39.0–52.0)
Hemoglobin: 14.7 g/dL (ref 13.0–17.0)
LYMPHS PCT: 21 %
Lymphs Abs: 1.4 10*3/uL (ref 0.7–4.0)
MCH: 31.2 pg (ref 26.0–34.0)
MCHC: 34.8 g/dL (ref 30.0–36.0)
MCV: 89.6 fL (ref 78.0–100.0)
MONO ABS: 0.5 10*3/uL (ref 0.1–1.0)
Monocytes Relative: 8 %
Neutro Abs: 4.3 10*3/uL (ref 1.7–7.7)
Neutrophils Relative %: 67 %
PLATELETS: 188 10*3/uL (ref 150–400)
RBC: 4.71 MIL/uL (ref 4.22–5.81)
RDW: 12.7 % (ref 11.5–15.5)
WBC: 6.4 10*3/uL (ref 4.0–10.5)

## 2015-06-21 LAB — MAGNESIUM: MAGNESIUM: 2.1 mg/dL (ref 1.7–2.4)

## 2015-06-21 SURGERY — CARDIOVERSION
Anesthesia: Monitor Anesthesia Care

## 2015-06-21 SURGERY — CARDIOVERSION (CATH LAB)
Anesthesia: LOCAL

## 2015-06-21 MED ORDER — SODIUM CHLORIDE 0.9% FLUSH: 3.0000 mL | INTRAVENOUS | Status: DC | PRN

## 2015-06-21 MED ORDER — MIDAZOLAM HCL 5 MG/5ML IJ SOLN
INTRAMUSCULAR | Status: DC | PRN
  Administered 2015-06-21: 1 mg via INTRAVENOUS
  Administered 2015-06-21 (×5): 2 mg via INTRAVENOUS

## 2015-06-21 MED ORDER — METOPROLOL TARTRATE 1 MG/ML IV SOLN
INTRAVENOUS | Status: AC
  Filled 2015-06-21: qty 5

## 2015-06-21 MED ORDER — METOPROLOL TARTRATE 1 MG/ML IV SOLN
INTRAVENOUS | Status: DC | PRN
  Administered 2015-06-21: 5 mg via INTRAVENOUS

## 2015-06-21 MED ORDER — SODIUM CHLORIDE 0.9 % IV SOLN: 250.0000 mL | INTRAVENOUS | Status: DC

## 2015-06-21 MED ORDER — MIDAZOLAM HCL 5 MG/5ML IJ SOLN
INTRAMUSCULAR | Status: AC
Start: 2015-06-21 — End: 2015-06-21
  Filled 2015-06-21: qty 5

## 2015-06-21 MED ORDER — MIDAZOLAM HCL 5 MG/5ML IJ SOLN
INTRAMUSCULAR | Status: AC
  Filled 2015-06-21: qty 5

## 2015-06-21 MED ORDER — FENTANYL CITRATE (PF) 100 MCG/2ML IJ SOLN
INTRAMUSCULAR | Status: AC
Start: 2015-06-21 — End: 2015-06-21
  Filled 2015-06-21: qty 2

## 2015-06-21 MED ORDER — FENTANYL CITRATE (PF) 100 MCG/2ML IJ SOLN
INTRAMUSCULAR | Status: DC | PRN
  Administered 2015-06-21 (×4): 25 ug via INTRAVENOUS
  Administered 2015-06-21: 12.5 ug via INTRAVENOUS
  Administered 2015-06-21: 25 ug via INTRAVENOUS

## 2015-06-21 MED ORDER — SODIUM CHLORIDE 0.9% FLUSH: 3.0000 mL | Freq: Two times a day (BID) | INTRAVENOUS | Status: DC

## 2015-06-21 MED ORDER — FENTANYL CITRATE (PF) 100 MCG/2ML IJ SOLN
INTRAMUSCULAR | Status: AC
  Filled 2015-06-21: qty 2

## 2015-06-21 SURGICAL SUPPLY — 1 items: PAD DEFIB LIFELINK (PAD) ×2 IMPLANT

## 2015-06-21 NOTE — Discharge Instructions (Signed)
Electrical Cardioversion °Electrical cardioversion is the delivery of a jolt of electricity to change the rhythm of the heart. Sticky patches or metal paddles are placed on the chest to deliver the electricity from a device. This is done to restore a normal rhythm. A rhythm that is too fast or not regular keeps the heart from pumping well. °Electrical cardioversion is done in an emergency if:  °· There is low or no blood pressure as a result of the heart rhythm.   °· Normal rhythm must be restored as fast as possible to protect the brain and heart from further damage.   °· It may save a life. °Cardioversion may be done for heart rhythms that are not immediately life threatening, such as atrial fibrillation or flutter, in which:  °· The heart is beating too fast or is not regular.   °· Medicine to change the rhythm has not worked.   °· It is safe to wait in order to allow time for preparation. °· Symptoms of the abnormal rhythm are bothersome. °· The risk of stroke and other serious problems can be reduced. °LET YOUR HEALTH CARE PROVIDER KNOW ABOUT:  °· Any allergies you have. °· All medicines you are taking, including vitamins, herbs, eye drops, creams, and over-the-counter medicines. °· Previous problems you or members of your family have had with the use of anesthetics.   °· Any blood disorders you have.   °· Previous surgeries you have had.   °· Medical conditions you have. °RISKS AND COMPLICATIONS  °Generally, this is a safe procedure. However, problems can occur and include:  °· Breathing problems related to the anesthetic used. °· A blood clot that breaks free and travels to other parts of your body. This could cause a stroke or other problems. The risk of this is lowered by use of blood-thinning medicine (anticoagulant) prior to the procedure. °· Cardiac arrest (rare). °BEFORE THE PROCEDURE  °· You may have tests to detect blood clots in your heart and to evaluate heart function.  °· You may start taking  anticoagulants so your blood does not clot as easily.   °· Medicines may be given to help stabilize your heart rate and rhythm. °PROCEDURE °· You will be given medicine through an IV tube to reduce discomfort and make you sleepy (sedative).   °· An electrical shock will be delivered. °AFTER THE PROCEDURE °Your heart rhythm will be watched to make sure it does not change.  °  °This information is not intended to replace advice given to you by your health care provider. Make sure you discuss any questions you have with your health care provider. °  °Document Released: 03/03/2002 Document Revised: 04/03/2014 Document Reviewed: 09/25/2012 °Elsevier Interactive Patient Education ©2016 Elsevier Inc. ° °

## 2015-06-21 NOTE — H&P (Signed)
H&P    Patient ID: [place] MRN: [id number], DOB/AGE: 05/11/1975 40 y.o.  Admit date: 06/21/2015 Date of Consult: 06/21/2015  Primary Physician: [name], MD Primary Cardiologist: Dr. Johney Frame (Dr. [name] at [place])   HPI: [place] is a 39 y.o. male with a past medical history significant for dyslipidemia, NICM (s/p MDT CRTD), atrial fibrillation and atypical atrial flutter s/p ablation x3 at [place] who presents today for DDCV with Dr. [name].  He had a trip to [place] for vacation 3[place]16-19, while there drank increased amount of ETOH, and towards the end of his trip started to feel tired and suspected he was out of rhythm.  He had an already scheduled visit with his [place] cardiologist last week and they confirmed him to be in Aflutter.  He feels no CP, palpitations or SOB, has been feeling a bit lightheaded and dizzy no syncope but has had near syncope.  No shocks.  He thinks he maybe having some vertigo.  Denies illness, fever/chills, N/V.  No CP, palpitations, no SOB.   Past Medical History  Diagnosis Date  . GERD (gastroesophageal reflux disease)   . Dyslipidemia   . Atrial flutter [facility])     CTI ablation by Dr [name] 02/24/11  . Atrial tachycardia (HCC)     mapped to AV node by Dr [name], ablation not performed  . Atrial fibrillation (HCC)     PVI ablatioin at [place] 04/21/14 and May 2016 Dr. [name]  . Premature ventricular contraction   . First degree AV block   . Gout     bilateral feet  . Lymphadenopathy of right cervical region ?Mono 04/16/2013  . Hx of cardiovascular stress test     Lexiscan Myoview (07/2013):  Inf and inferolateral scar, no ischemia, no gated  . Cardiomyopathy, nonischemic (HCC)     Medtronic BIVE ICD      Surgical History:  Past Surgical History  Procedure Laterality Date  . Root canal    . Mouth surgery    . Atrial ablation surgery  02/24/11    CTI ablation by Dr [name]  . Cardioversion (bedside)  03/29/2011       . Cardioversion   03/29/2011    Procedure: CARDIOVERSION;  Surgeon: [name], MD;  Location: [facility] OR;  Service: Cardiovascular;  Laterality: [place];  . Examination under anesthesia  12/19/12    Anal Fistula  . Atrial flutter ablation [place] 02/24/2011    Procedure: ATRIAL FLUTTER ABLATION;  Surgeon: [name], MD;  Location: [place];  Service: Cardiovascular;  Laterality: [place];  . Atrial fibrillation ablation  04/24/14, 08/13/14    PVI at [place] by Dr [name], PVI with posterior wall Box and FIRM ablation performed  . Cardioversion [place] 02/25/2015    Procedure: CARDIOVERSION;  Surgeon: [name], MD;  Location: [facility] ENDOSCOPY;  Service: Cardiovascular;  Laterality: [place];     Prescriptions prior to admission  Medication Sig Dispense Refill Last Dose  . acyclovir (ZOVIRAX) 400 MG tablet Take 400 mg by mouth 2 (two) times daily as needed (outbreak).    06/21/2015 at 0700  . allopurinol (ZYLOPRIM) 300 MG tablet Take 300 mg by mouth every morning.    06/21/2015 at 0700  . ALPRAZolam (XANAX) 0.5 MG tablet Take 0.5 tablets by mouth daily as needed. anxiety  1 Past Week at Unknown time  . atorvastatin (LIPITOR) 20 MG tablet Take 20 mg by mouth every evening.    06/20/2015 at Unknown time  .  b complex vitamins capsule Take 1 capsule by mouth every evening.    06/20/2015 at Unknown time  . carvedilol (COREG) 6.25 MG tablet Take 1 tablet (6.25 mg total) by mouth 2 (two) times daily. 180 tablet 3 06/21/2015 at 0700  . cholecalciferol (VITAMIN D) 1000 UNITS tablet Take 1,000 Units by mouth every evening.    Past Week at Unknown time  . dofetilide (TIKOSYN) 500 MCG capsule Take 1 capsule (500 mcg total) by mouth 2 (two) times daily. 180 capsule 3 06/21/2015 at 0700  . fish oil-omega-3 fatty acids 1000 MG capsule Take 2 g by mouth every evening.    06/20/2015 at Unknown time  . magnesium oxide (MAG-OX) 400 (241.3 Mg) MG tablet Take 1 tablet by mouth every morning.   11 06/21/2015 at 0700  . Multiple Vitamin (MULITIVITAMIN WITH  MINERALS) TABS Take 1 tablet by mouth every evening.    06/20/2015 at Unknown time  . PRADAXA 150 MG CAPS capsule TAKE ONE CAPSULE BY MOUTH TWICE A DAY 60 capsule 11 06/21/2015 at 0700  . psyllium (METAMUCIL) 58.6 % packet Take 1 packet by mouth daily.   06/20/2015 at Unknown time  . sacubitril-valsartan (ENTRESTO) 49-51 MG Take 1 tablet by mouth 2 (two) times daily. 60 tablet 6 06/21/2015 at 0700  . spironolactone (ALDACTONE) 25 MG tablet TAKE 1/2 TABLET BY MOUTH ONCE DAILY 15 tablet 3 06/20/2015 at Unknown time  . indomethacin (INDOCIN) 25 MG capsule Take 25 mg by mouth 3 (three) times daily as needed. gout   More than a month at Unknown time    Inpatient Medications:   Allergies: No Known Allergies  Social History   Social History  . Marital Status: Married    Spouse Name: [place]  . Number of Children: [place]  . Years of Education: [place]   Occupational History  . Not on file.   Social History Main Topics  . Smoking status: Former Smoker    Quit date: 08/28/2010  . Smokeless tobacco: Never Used  . Alcohol Use: No     Comment: quit 6 months ago  . Drug Use: Yes    Special: Marijuana     Comment: still currently smoking  . Sexual Activity: Yes   Other Topics Concern  . Not on file   Social History Narrative   His additional social history is notable that the patient exercises     several times a week, jogging up to 2 miles a day.  With exercise, he     notes that he has lost over 10 pounds.      Family History  Problem Relation Age of Onset  . Heart disease Mother   . Achondroplasia Mother   . Alcohol abuse Mother   . Achondroplasia Son   . Sudden death Mother   . Sudden death Maternal Aunt   . Sudden death Maternal Grandmother   . Dilated cardiomyopathy Mother   . Other Maternal Aunt     substance abuse     Review of Systems: All other systems reviewed and are otherwise negative except as noted above.  Physical Exam: Filed Vitals:   06/21/15 0721  BP: 134/103    Pulse: 88  Temp: 97.7 F (36.5 C)  TempSrc: Oral  Resp: 18  Height: 6\' 2"  (1.88 m)  Weight: 252 lb (114.306 kg)  SpO2: 100%    GEN- The patient is well appearing, alert and oriented x 3 today.   HEENT: normocephalic, atraumatic; sclera clear, conjunctiva pink; hearing intact;  oropharynx clear; neck supple, no JVP Lymph- no cervical lymphadenopathy Lungs- Clear to ausculation bilaterally, normal work of breathing.  No wheezes, rales, rhonchi Heart- Regular rate and rhythm, no significant  murmurs, rubs or gallops, PMI not laterally displaced GI- soft, non-tender, non-distended, bowel sounds present Extremities- no clubbing, cyanosis, or edema MS- no significant deformity or atrophy Skin- warm and dry, no rash or lesion Psych- euthymic mood, full affect Neuro- no gross deficits observed  Labs:   [facility] Results  Component Value Date   WBC 8.7 05/01/2014   HGB 15.7 05/01/2014   HCT 44.3 05/01/2014   MCV 89.5 05/01/2014   PLT 220 05/01/2014   No results for input(s): NA, K, CL, CO2, BUN, CREATININE, CALCIUM, PROT, BILITOT, ALKPHOS, ALT, AST, GLUCOSE in the last 168 hours.  Invalid input(s): LABALBU    Radiology/Studies: No results found.  EKG: V paced DEVICE HISTORY: MDT CRT-D  Assessment and Plan:   1. PAflutter     Planned for DCCV today with Dr. [name]     On Tikosyn, Pradaxa     The patient confirms he has not missed any doses of his Pradaxa in months if ever.      QTc stable      Check labs      counseled on ETOH  2. NICCM     Appears copensated by exam     MDT CRT-D  3. Lightheaded,  Near syncope reported early this morning     Will get his device checked this morning to evaluate rhythm/historgrams            Signed, Francis Dowse, PA-C 06/21/2015 8:00 AM  EP Attending  Patient seen and examined. Agree with above. I have reviewed his ICD and it shows that he had a brief period of NSR yesterday but is out of rhythm since then. Will plan to proceed with  DCCV but he is at risk for not maintaining NSR. He denies missing any of his cardiac meds including his blood thinners.   Leonia Reeves.D.

## 2015-07-03 ENCOUNTER — Other Ambulatory Visit (HOSPITAL_COMMUNITY): Payer: Self-pay | Admitting: Internal Medicine

## 2015-07-12 DIAGNOSIS — Z95810 Presence of automatic (implantable) cardiac defibrillator: Secondary | ICD-10-CM | POA: Insufficient documentation

## 2015-07-13 HISTORY — PX: ABLATION OF DYSRHYTHMIC FOCUS: SHX254

## 2015-07-15 ENCOUNTER — Telehealth: Payer: Self-pay

## 2015-07-15 NOTE — Telephone Encounter (Addendum)
Left message for pt to call back.  He needs to be scheduled for cardioversion next available. Pt recently converted back out of rhythm and per orders from [facility] pt can get cardioversion. (Per notes possible failed DCCV then in 3 months Amiodarone and possible additional cardioversion)

## 2015-07-15 NOTE — Telephone Encounter (Signed)
F/u  Pt returning Rn phone call. Please call back and discuss.   

## 2015-07-16 NOTE — Telephone Encounter (Signed)
Fu   Pt calling to speak to rn, he verbalized that he has not heard anything back from rn

## 2015-07-16 NOTE — Telephone Encounter (Addendum)
Scheduled for Mon at 1:00pm Check in at [facility] Entrance at 11:30am Dr Eden Emms to do Labs done at [facility] earlier in week for ablation, will try to obtain to avoid redraw Case number [phone]  Patient returned call and was cardioverted  late Wed afternoon at [facility]  He has a follow up on Tues which is a pre-planned apt.  Will see if we can have his device interrogated at that appointment    Spoke with Penni Bombard and canceled procedure

## 2015-07-19 ENCOUNTER — Encounter (HOSPITAL_COMMUNITY): Admission: RE | Payer: Self-pay | Source: Ambulatory Visit

## 2015-07-19 ENCOUNTER — Ambulatory Visit (HOSPITAL_COMMUNITY): Admission: RE | Admit: 2015-07-19 | Payer: 59 | Source: Ambulatory Visit | Admitting: Cardiovascular Disease

## 2015-07-19 SURGERY — CARDIOVERSION
Anesthesia: Monitor Anesthesia Care

## 2015-07-20 ENCOUNTER — Ambulatory Visit (HOSPITAL_COMMUNITY)
Admission: RE | Admit: 2015-07-20 | Discharge: 2015-07-20 | Disposition: A | Discharge status: Home / Self Care | Payer: 59 | Source: Ambulatory Visit | Attending: Nurse Practitioner | Admitting: Nurse Practitioner

## 2015-07-20 ENCOUNTER — Encounter (HOSPITAL_COMMUNITY): Payer: Self-pay | Admitting: Nurse Practitioner

## 2015-07-20 VITALS — BP 132/78 | HR 87 | Ht 74.0 in | Wt 250.8 lb

## 2015-07-20 DIAGNOSIS — Z0189 Encounter for other specified special examinations: Secondary | ICD-10-CM | POA: Diagnosis not present

## 2015-07-20 DIAGNOSIS — I471 Supraventricular tachycardia: Secondary | ICD-10-CM | POA: Insufficient documentation

## 2015-07-20 DIAGNOSIS — K219 Gastro-esophageal reflux disease without esophagitis: Secondary | ICD-10-CM | POA: Diagnosis not present

## 2015-07-20 DIAGNOSIS — M109 Gout, unspecified: Secondary | ICD-10-CM | POA: Diagnosis not present

## 2015-07-20 DIAGNOSIS — Z7901 Long term (current) use of anticoagulants: Secondary | ICD-10-CM | POA: Diagnosis not present

## 2015-07-20 DIAGNOSIS — F1220 Cannabis dependence, uncomplicated: Secondary | ICD-10-CM | POA: Diagnosis not present

## 2015-07-20 DIAGNOSIS — I493 Ventricular premature depolarization: Secondary | ICD-10-CM | POA: Insufficient documentation

## 2015-07-20 DIAGNOSIS — I481 Persistent atrial fibrillation: Secondary | ICD-10-CM | POA: Diagnosis present

## 2015-07-20 DIAGNOSIS — Z87891 Personal history of nicotine dependence: Secondary | ICD-10-CM | POA: Insufficient documentation

## 2015-07-20 DIAGNOSIS — I4891 Unspecified atrial fibrillation: Secondary | ICD-10-CM | POA: Diagnosis not present

## 2015-07-20 DIAGNOSIS — I440 Atrioventricular block, first degree: Secondary | ICD-10-CM | POA: Insufficient documentation

## 2015-07-20 DIAGNOSIS — E785 Hyperlipidemia, unspecified: Secondary | ICD-10-CM | POA: Insufficient documentation

## 2015-07-20 DIAGNOSIS — R590 Localized enlarged lymph nodes: Secondary | ICD-10-CM | POA: Insufficient documentation

## 2015-07-20 DIAGNOSIS — I429 Cardiomyopathy, unspecified: Secondary | ICD-10-CM | POA: Diagnosis not present

## 2015-07-20 DIAGNOSIS — I4892 Unspecified atrial flutter: Secondary | ICD-10-CM | POA: Diagnosis not present

## 2015-07-20 DIAGNOSIS — Z950 Presence of cardiac pacemaker: Secondary | ICD-10-CM | POA: Diagnosis present

## 2015-07-20 DIAGNOSIS — I4819 Other persistent atrial fibrillation: Secondary | ICD-10-CM

## 2015-07-20 DIAGNOSIS — Z9889 Other specified postprocedural states: Secondary | ICD-10-CM | POA: Insufficient documentation

## 2015-07-20 NOTE — Progress Notes (Addendum)
Patient ID: Donald Grant, male   DOB: [birth year], 40 y.o.   MRN: [id number]     Primary Care Physician: [name], MD Referring Physician: Dr. Johney Frame EP: Dr. Allred/Dr. [name], EP at [facility] is a 40 y.o. male with a h/o with h/o  atrial tachycardia 02/24/11, Af ablation 1/26, 5/16 and most recently 4/19, at [facility], by Dr. [name]. He also has an BiV ICD also implanted by Dr.Duabert. He remained in afib after the ablation on 4/19 and had an DCCV at [facility] before d/c on 4/20. He is in the afib clinic today for interrogation of his device since it is difficult to tell he is in afib with v pacing by EKG. [employer] rep, Leta Jungling in to interrogate and he is found to be in afib. Pt thought he was because he has not felt as well. He denies any swallowing difficulties or groin issues, but does report numbness on the very tip of his tongue since procedure.  Today, he denies symptoms of palpitations, chest pain, shortness of breath, orthopnea, PND, lower extremity edema, dizziness, presyncope, syncope, or neurologic sequela.  Positive for fatigue and some mild dizziness.The patient is tolerating medications without difficulties and is otherwise without complaint today.   Past Medical History  Diagnosis Date  . GERD (gastroesophageal reflux disease)   . Dyslipidemia   . Atrial flutter [facility])     CTI ablation by Dr [name] 02/24/11  . Atrial tachycardia (HCC)     mapped to AV node by Dr [name], ablation not performed  . Atrial fibrillation (HCC)     PVI ablatioin at [facility] 04/21/14 and May 2016 Dr. [name]  . Premature ventricular contraction   . First degree AV block   . Gout     bilateral feet  . Lymphadenopathy of right cervical region ?Mono 04/16/2013  . Hx of cardiovascular stress test     Lexiscan Myoview (07/2013):  Inf and inferolateral scar, no ischemia, no gated  . Cardiomyopathy, nonischemic (HCC)     [employer] BIVE ICD    Past Surgical History  Procedure  Laterality Date  . Root canal    . Mouth surgery    . Atrial ablation surgery  02/24/11    CTI ablation by Dr [name]  . Cardioversion (bedside)  03/29/2011       . Cardioversion  03/29/2011    Procedure: CARDIOVERSION;  Surgeon: [name], MD;  Location: [facility] OR;  Service: Cardiovascular;  Laterality: N/A;  . Examination under anesthesia  12/19/12    Anal Fistula  . Atrial flutter ablation N/A 02/24/2011    Procedure: ATRIAL FLUTTER ABLATION;  Surgeon: [name], MD;  Location: [facility] CATH LAB;  Service: Cardiovascular;  Laterality: N/A;  . Atrial fibrillation ablation  04/24/14, 08/13/14    PVI at [facility] by Dr [name], PVI with posterior wall Box and FIRM ablation performed  . Cardioversion N/A 02/25/2015    Procedure: CARDIOVERSION;  Surgeon: [name], MD;  Location: [facility] ENDOSCOPY;  Service: Cardiovascular;  Laterality: N/A;  . Electrophysiologic study N/A 06/21/2015    Procedure: Cardioversion;  Surgeon: [name], MD;  Location: [facility] INVASIVE CV LAB;  Service: Cardiovascular;  Laterality: N/A;    Current Outpatient Prescriptions  Medication Sig Dispense Refill  . allopurinol (ZYLOPRIM) 300 MG tablet Take 300 mg by mouth every morning.     [name] ALPRAZolam (XANAX) 0.5 MG tablet Take 0.5 tablets by mouth daily as needed. anxiety  1  .  atorvastatin (LIPITOR) 20 MG tablet Take 20 mg by mouth every evening.     [name] b complex vitamins capsule Take 1 capsule by mouth every evening.     . carvedilol (COREG) 6.25 MG tablet Take 1 tablet (6.25 mg total) by mouth 2 (two) times daily. 180 tablet 3  . cholecalciferol (VITAMIN D) 1000 UNITS tablet Take 1,000 Units by mouth every evening.     . dofetilide (TIKOSYN) 500 MCG capsule Take 1 capsule (500 mcg total) by mouth 2 (two) times daily. 180 capsule 3  . fish oil-omega-3 fatty acids 1000 MG capsule Take 2 g by mouth every evening.     . magnesium oxide (MAG-OX) 400 (241.3 Mg) MG tablet Take 1 tablet by mouth every morning.   11  . Multiple  Vitamin (MULITIVITAMIN WITH MINERALS) TABS Take 1 tablet by mouth every evening.     . pantoprazole (PROTONIX) 40 MG tablet Take 40 mg by mouth daily.    [name] PRADAXA 150 MG CAPS capsule TAKE ONE CAPSULE BY MOUTH TWICE A DAY 60 capsule 11  . psyllium (METAMUCIL) 58.6 % packet Take 1 packet by mouth daily.    . sacubitril-valsartan (ENTRESTO) 49-51 MG Take 1 tablet by mouth 2 (two) times daily. 60 tablet 6  . spironolactone (ALDACTONE) 25 MG tablet TAKE 1/2 TABLET BY MOUTH ONCE DAILY 15 tablet 3  . acyclovir (ZOVIRAX) 400 MG tablet Take 400 mg by mouth 2 (two) times daily as needed (outbreak).     . indomethacin (INDOCIN) 25 MG capsule Take 25 mg by mouth 3 (three) times daily as needed. gout     No current facility-administered medications for this encounter.    No Known Allergies  Social History   Social History  . Marital Status: Married    Spouse Name: N/A  . Number of Children: N/A  . Years of Education: N/A   Occupational History  . Not on file.   Social History Main Topics  . Smoking status: Former Smoker    Quit date: 08/28/2010  . Smokeless tobacco: Never Used  . Alcohol Use: No     Comment: quit 6 months ago  . Drug Use: Yes    Special: Marijuana     Comment: still currently smoking  . Sexual Activity: Yes   Other Topics Concern  . Not on file   Social History Narrative   His additional social history is notable that the patient exercises     several times a week, jogging up to 2 miles a day.  With exercise, he     notes that he has lost over 10 pounds.     Family History  Problem Relation Age of Onset  . Heart disease Mother   . Achondroplasia Mother   . Alcohol abuse Mother   . Achondroplasia Son   . Sudden death Mother   . Sudden death Maternal Aunt   . Sudden death Maternal Grandmother   . Dilated cardiomyopathy Mother   . Other Maternal Aunt     substance abuse    ROS- All systems are reviewed and negative except as per the HPI above  Physical  Exam: Filed Vitals:   07/20/15 0845  BP: 132/78  Pulse: 87  Height: 6\' 2"  (1.88 m)  Weight: 250 lb 12.8 oz (113.762 kg)    GEN- The patient is well appearing, alert and oriented x 3 today.   Head- normocephalic, atraumatic Eyes-  Sclera clear, conjunctiva pink Ears- hearing intact Oropharynx- clear Neck- supple,  no JVP Lymph- no cervical lymphadenopathy Lungs- Clear to ausculation bilaterally, normal work of breathing Heart- Paced regular rate and rhythm, no murmurs, rubs or gallops, PMI not laterally displaced GI- soft, NT, ND, + BS Extremities- no clubbing, cyanosis, or edema MS- no significant deformity or atrophy Skin- no rash or lesion Psych- euthymic mood, full affect Neuro- strength and sensation are intact  EKG- atrial sensed and v paced at 87 bpm Interrogation of device which confirms that pt is in afib, was only out for short period of time following cardioversion. Normal functioning device.  Assessment and Plan: 1.Persistent afib s/p 3 afib ablations, s/p BIV/ICD pacemaker Failed cardioversion after last ablation 4/19  Continue tikosyn Really unsure how to procedure Will discuss with Dr. Johney Frame and get back to pt re his recommendations Possibly repeat cardioversion in a few weeks allowing for some recovery from ablation?  Continue Pradaxa  Elvina Sidle. [facility] [address] [place], [place] [address] [phone]  4/27-Addendum, discussed with Dr. Johney Frame and he said to let the heart heal for several more weeks and then can attempt another cardioversion. He will be seen back in 3 weeks with interrogation and if still in afib/flitter, then will scheduled cardioversion. He will be given a f/u appointment with Dr. Johney Frame at that point. Pt aware.

## 2015-07-22 NOTE — Addendum Note (Signed)
Encounter addended by: Newman Nip, NP on: 07/22/2015 12:20 PM<BR>     Documentation filed: Notes Section

## 2015-07-26 ENCOUNTER — Telehealth: Payer: Self-pay | Admitting: *Deleted

## 2015-07-26 DIAGNOSIS — I480 Paroxysmal atrial fibrillation: Secondary | ICD-10-CM

## 2015-07-26 NOTE — Telephone Encounter (Addendum)
Dr Johney Frame has asked me to set patient up for DCCV per Dr [name] at [facility] on 08/02/15.  If this fails then will stop Tikosyn and switch to Amiodarone 400mg  bid and after taking for 2 weeks will have him follow up with Rudi Coco, NP in the afib clinic week of 08/16/15.  If still out of rhythm may need another DCCV.  I will have patient come in on Fri to afib clinic for H&P and labs.

## 2015-07-28 ENCOUNTER — Ambulatory Visit: Payer: 59 | Admitting: Physician Assistant

## 2015-07-30 ENCOUNTER — Ambulatory Visit (HOSPITAL_COMMUNITY)
Admission: RE | Admit: 2015-07-30 | Discharge: 2015-07-30 | Disposition: A | Discharge status: Home / Self Care | Payer: 59 | Source: Ambulatory Visit | Attending: Nurse Practitioner | Admitting: Nurse Practitioner

## 2015-07-30 ENCOUNTER — Other Ambulatory Visit (HOSPITAL_COMMUNITY): Payer: Self-pay | Admitting: Family Medicine

## 2015-07-30 ENCOUNTER — Encounter: Payer: Self-pay | Admitting: Internal Medicine

## 2015-07-30 DIAGNOSIS — Z7901 Long term (current) use of anticoagulants: Secondary | ICD-10-CM | POA: Insufficient documentation

## 2015-07-30 DIAGNOSIS — Z0189 Encounter for other specified special examinations: Secondary | ICD-10-CM | POA: Diagnosis present

## 2015-07-30 DIAGNOSIS — I4892 Unspecified atrial flutter: Secondary | ICD-10-CM | POA: Diagnosis not present

## 2015-07-30 DIAGNOSIS — I4891 Unspecified atrial fibrillation: Secondary | ICD-10-CM | POA: Insufficient documentation

## 2015-07-30 DIAGNOSIS — Z87891 Personal history of nicotine dependence: Secondary | ICD-10-CM | POA: Insufficient documentation

## 2015-07-30 DIAGNOSIS — E785 Hyperlipidemia, unspecified: Secondary | ICD-10-CM | POA: Insufficient documentation

## 2015-07-30 DIAGNOSIS — Z9889 Other specified postprocedural states: Secondary | ICD-10-CM | POA: Diagnosis not present

## 2015-07-30 DIAGNOSIS — I471 Supraventricular tachycardia: Secondary | ICD-10-CM | POA: Insufficient documentation

## 2015-07-30 DIAGNOSIS — I480 Paroxysmal atrial fibrillation: Secondary | ICD-10-CM | POA: Diagnosis not present

## 2015-07-30 DIAGNOSIS — Z950 Presence of cardiac pacemaker: Secondary | ICD-10-CM | POA: Diagnosis not present

## 2015-07-30 DIAGNOSIS — I481 Persistent atrial fibrillation: Secondary | ICD-10-CM | POA: Diagnosis present

## 2015-07-30 DIAGNOSIS — K219 Gastro-esophageal reflux disease without esophagitis: Secondary | ICD-10-CM | POA: Diagnosis not present

## 2015-07-30 DIAGNOSIS — M109 Gout, unspecified: Secondary | ICD-10-CM | POA: Insufficient documentation

## 2015-07-30 DIAGNOSIS — I429 Cardiomyopathy, unspecified: Secondary | ICD-10-CM | POA: Diagnosis not present

## 2015-07-30 NOTE — Progress Notes (Signed)
Patient ID: Donald Grant, male   DOB: 23-Aug-1975, 40 y.o.   MRN: [id number]     Primary Care Physician: [name], MD Referring Physician: Dr. Johney Frame EP: Dr. Allred/Dr. [name], EP at [facility] is a 40 y.o. male with a h/o with h/o  atrial tachycardia 02/24/11, Af ablation 1/26, 5/16 and most recently 4/19, at [facility], by Dr. [name]. He also in the past  has an BiV ICD also implanted by Dr.Duabert. He remained in afib after the ablation on 4/19 and had an DCCV at [facility] before d/c on 4/20. He is in the afib clinic today for interrogation of his device since it is difficult to tell he is in afib with v pacing by EKG. [employer] rep, Leta Jungling in to interrogate and he is found to be in afib. Pt thought he was because he has not felt as well. He denies any swallowing difficulties or groin issues, but does report numbness on the very tip of his tongue since procedure.   Pt returns 5/5 to afib clinic. On last visit it was decided to let pt heal a few more weeks and set up cardioversion if afib persisited. However today, pt is bascially in afib/flutter most of the time but has multiple times when he will spontaneously convert but only for secs at at time. This was reported to Dr. Johney Frame, did not think DCCV was needed in this case. He spoke to [place], [employer] rep, he asked her to turn on ATP Minerva per study programming. It worked briefly but he then returned to afib. Continues on tikosyn, pradaxa. He did not feel that well yesterday but feels pretty good today.  Today, he denies symptoms of palpitations, chest pain, shortness of breath, orthopnea, PND, lower extremity edema, dizziness, presyncope, syncope, or neurologic sequela.  Positive for fatigue and some mild dizziness.The patient is tolerating medications without difficulties and is otherwise without complaint today.   Past Medical History  Diagnosis Date  . GERD (gastroesophageal reflux disease)   . Dyslipidemia   .  Atrial flutter [facility])     CTI ablation by Dr [name] 02/24/11  . Atrial tachycardia (HCC)     mapped to AV node by Dr [name], ablation not performed  . Atrial fibrillation (HCC)     PVI ablatioin at [facility] 04/21/14 and May 2016 Dr. [name]  . Premature ventricular contraction   . First degree AV block   . Gout     bilateral feet  . Lymphadenopathy of right cervical region ?Mono 04/16/2013  . Hx of cardiovascular stress test     Lexiscan Myoview (07/2013):  Inf and inferolateral scar, no ischemia, no gated  . Cardiomyopathy, nonischemic (HCC)     [employer] BIVE ICD    Past Surgical History  Procedure Laterality Date  . Root canal    . Mouth surgery    . Atrial ablation surgery  02/24/11    CTI ablation by Dr [name]  . Cardioversion (bedside)  03/29/2011       . Cardioversion  03/29/2011    Procedure: CARDIOVERSION;  Surgeon: [name], MD;  Location: [facility] OR;  Service: Cardiovascular;  Laterality: N/A;  . Examination under anesthesia  12/19/12    Anal Fistula  . Atrial flutter ablation N/A 02/24/2011    Procedure: ATRIAL FLUTTER ABLATION;  Surgeon: [name], MD;  Location: [facility] CATH LAB;  Service: Cardiovascular;  Laterality: N/A;  . Atrial fibrillation ablation  04/24/14, 08/13/14  PVI at [facility] by Dr [name], PVI with posterior wall Box and FIRM ablation performed  . Cardioversion N/A 02/25/2015    Procedure: CARDIOVERSION;  Surgeon: [name], MD;  Location: [facility] ENDOSCOPY;  Service: Cardiovascular;  Laterality: N/A;  . Electrophysiologic study N/A 06/21/2015    Procedure: Cardioversion;  Surgeon: [name], MD;  Location: [facility] INVASIVE CV LAB;  Service: Cardiovascular;  Laterality: N/A;    Current Outpatient Prescriptions  Medication Sig Dispense Refill  . acyclovir (ZOVIRAX) 400 MG tablet Take 400 mg by mouth 2 (two) times daily as needed (outbreak).     [name] allopurinol (ZYLOPRIM) 300 MG tablet Take 300 mg by mouth every morning.     [name] ALPRAZolam (XANAX) 0.5 MG tablet Take  0.5 tablets by mouth daily as needed. anxiety  1  . atorvastatin (LIPITOR) 20 MG tablet Take 20 mg by mouth every evening.     [name] b complex vitamins capsule Take 1 capsule by mouth every evening.     . carvedilol (COREG) 6.25 MG tablet Take 1 tablet (6.25 mg total) by mouth 2 (two) times daily. 180 tablet 3  . cholecalciferol (VITAMIN D) 1000 UNITS tablet Take 1,000 Units by mouth every evening.     . dofetilide (TIKOSYN) 500 MCG capsule Take 1 capsule (500 mcg total) by mouth 2 (two) times daily. 180 capsule 3  . fish oil-omega-3 fatty acids 1000 MG capsule Take 2 g by mouth every evening.     . indomethacin (INDOCIN) 25 MG capsule Take 25 mg by mouth 3 (three) times daily as needed. gout    . magnesium oxide (MAG-OX) 400 (241.3 Mg) MG tablet Take 1 tablet by mouth every morning.   11  . Multiple Vitamin (MULITIVITAMIN WITH MINERALS) TABS Take 1 tablet by mouth every evening.     . pantoprazole (PROTONIX) 40 MG tablet Take 40 mg by mouth daily.    [name] PRADAXA 150 MG CAPS capsule TAKE ONE CAPSULE BY MOUTH TWICE A DAY 60 capsule 11  . psyllium (METAMUCIL) 58.6 % packet Take 1 packet by mouth daily.    . sacubitril-valsartan (ENTRESTO) 49-51 MG Take 1 tablet by mouth 2 (two) times daily. 60 tablet 6  . spironolactone (ALDACTONE) 25 MG tablet TAKE 1/2 TABLET BY MOUTH ONCE DAILY 15 tablet 3   No current facility-administered medications for this encounter.    No Known Allergies  Social History   Social History  . Marital Status: Married    Spouse Name: N/A  . Number of Children: N/A  . Years of Education: N/A   Occupational History  . Not on file.   Social History Main Topics  . Smoking status: Former Smoker    Quit date: 08/28/2010  . Smokeless tobacco: Never Used  . Alcohol Use: No     Comment: quit 6 months ago  . Drug Use: Yes    Special: Marijuana     Comment: still currently smoking  . Sexual Activity: Yes   Other Topics Concern  . Not on file   Social History Narrative     His additional social history is notable that the patient exercises     several times a week, jogging up to 2 miles a day.  With exercise, he     notes that he has lost over 10 pounds.     Family History  Problem Relation Age of Onset  . Heart disease Mother   . Achondroplasia Mother   . Alcohol abuse Mother   .  Achondroplasia Son   . Sudden death Mother   . Sudden death Maternal Aunt   . Sudden death Maternal Grandmother   . Dilated cardiomyopathy Mother   . Other Maternal Aunt     substance abuse    ROS- All systems are reviewed and negative except as per the HPI above  Physical Exam: There were no vitals filed for this visit.  GEN- The patient is well appearing, alert and oriented x 3 today.   Head- normocephalic, atraumatic Eyes-  Sclera clear, conjunctiva pink Ears- hearing intact Oropharynx- clear Neck- supple, no JVP Lymph- no cervical lymphadenopathy Lungs- Clear to ausculation bilaterally, normal work of breathing Heart- Paced regular rate and rhythm, no murmurs, rubs or gallops, PMI not laterally displaced GI- soft, NT, ND, + BS Extremities- no clubbing, cyanosis, or edema MS- no significant deformity or atrophy Skin- no rash or lesion Psych- euthymic mood, full affect Neuro- strength and sensation are intact   Interrogation of device which confirms that pt is in afib/flutter, intermittent return to SR but only briefly. ATP Minerva turned on.  Normal functioning device, interrogated by Noreene Larsson, [employer] rep .  Assessment and Plan: 1.Persistent afib s/p 3 afib ablations, s/p BIV/ICD pacemaker Failed cardioversion after last ablation 4/19  Showing some return to SR but only brief. Therefore , DCCV cancelled Dr. Johney Frame wants pt to have benefit of Minerva  atrial pacing for a couple of weeks and then he will see back for further treatmnet plan.   Continue tikosyn, there has been discussion that pt may need to have tikosyn d/ced and try amiodarone if continues in  afib Continue Pradaxa  Lupita Leash C. [facility] [address] [place], [place] [address] [phone]

## 2015-07-30 NOTE — Patient Instructions (Signed)
May 24th @ 8:30am with Dr. Johney Frame

## 2015-08-02 ENCOUNTER — Ambulatory Visit (HOSPITAL_COMMUNITY): Admission: RE | Admit: 2015-08-02 | Payer: 59 | Source: Ambulatory Visit | Admitting: Internal Medicine

## 2015-08-02 ENCOUNTER — Encounter (HOSPITAL_COMMUNITY): Admission: RE | Payer: Self-pay | Source: Ambulatory Visit

## 2015-08-02 SURGERY — CARDIOVERSION
Anesthesia: Monitor Anesthesia Care

## 2015-08-10 ENCOUNTER — Encounter: Payer: Self-pay | Admitting: Internal Medicine

## 2015-08-11 ENCOUNTER — Encounter: Payer: Self-pay | Admitting: Internal Medicine

## 2015-08-13 ENCOUNTER — Ambulatory Visit (HOSPITAL_COMMUNITY): Payer: 59 | Admitting: Nurse Practitioner

## 2015-08-18 ENCOUNTER — Encounter: Payer: Self-pay | Admitting: Internal Medicine

## 2015-08-18 ENCOUNTER — Ambulatory Visit (INDEPENDENT_AMBULATORY_CARE_PROVIDER_SITE_OTHER): Payer: 59 | Admitting: Internal Medicine

## 2015-08-18 VITALS — BP 124/78 | HR 86 | Ht 74.0 in | Wt 252.2 lb

## 2015-08-18 DIAGNOSIS — I480 Paroxysmal atrial fibrillation: Secondary | ICD-10-CM | POA: Diagnosis not present

## 2015-08-18 DIAGNOSIS — I429 Cardiomyopathy, unspecified: Secondary | ICD-10-CM | POA: Diagnosis not present

## 2015-08-18 DIAGNOSIS — I4819 Other persistent atrial fibrillation: Secondary | ICD-10-CM

## 2015-08-18 DIAGNOSIS — I481 Persistent atrial fibrillation: Secondary | ICD-10-CM

## 2015-08-18 LAB — CUP PACEART INCLINIC DEVICE CHECK
Battery Remaining Longevity: 66 mo
Brady Statistic AP VS Percent: 0.24 %
Brady Statistic AS VP Percent: 33.41 %
Brady Statistic AS VS Percent: 1.52 %
Date Time Interrogation Session: 20170524103132
HIGH POWER IMPEDANCE MEASURED VALUE: 66 Ohm
Implantable Lead Location: 753859
Implantable Lead Model: 3830
Implantable Lead Model: 5076
Lead Channel Impedance Value: 285 Ohm
Lead Channel Impedance Value: 361 Ohm
Lead Channel Impedance Value: 418 Ohm
Lead Channel Pacing Threshold Amplitude: 1 V
Lead Channel Pacing Threshold Pulse Width: 0.4 ms
Lead Channel Pacing Threshold Pulse Width: 0.8 ms
Lead Channel Sensing Intrinsic Amplitude: 6 mV
Lead Channel Sensing Intrinsic Amplitude: 6 mV
Lead Channel Setting Pacing Amplitude: 0.5 V
Lead Channel Setting Pacing Amplitude: 2 V
Lead Channel Setting Pacing Amplitude: 2.5 V
Lead Channel Setting Pacing Pulse Width: 0.03 ms
Lead Channel Setting Pacing Pulse Width: 0.8 ms
Lead Channel Setting Sensing Sensitivity: 0.3 mV
MDC IDC LEAD IMPLANT DT: 20160628
MDC IDC LEAD IMPLANT DT: 20160628
MDC IDC LEAD IMPLANT DT: 20160628
MDC IDC LEAD LOCATION: 753858
MDC IDC LEAD LOCATION: 753860
MDC IDC MSMT BATTERY VOLTAGE: 2.96 V
MDC IDC MSMT LEADCHNL LV IMPEDANCE VALUE: 247 Ohm
MDC IDC MSMT LEADCHNL LV IMPEDANCE VALUE: 456 Ohm
MDC IDC MSMT LEADCHNL RA IMPEDANCE VALUE: 456 Ohm
MDC IDC MSMT LEADCHNL RA PACING THRESHOLD AMPLITUDE: 1 V
MDC IDC MSMT LEADCHNL RA SENSING INTR AMPL: 2.5 mV
MDC IDC MSMT LEADCHNL RA SENSING INTR AMPL: 3.625 mV
MDC IDC MSMT LEADCHNL RV PACING THRESHOLD AMPLITUDE: 0.625 V
MDC IDC MSMT LEADCHNL RV PACING THRESHOLD PULSEWIDTH: 0.4 ms
MDC IDC STAT BRADY AP VP PERCENT: 64.83 %
MDC IDC STAT BRADY RA PERCENT PACED: 65.07 %
MDC IDC STAT BRADY RV PERCENT PACED: 97.76 %

## 2015-08-18 NOTE — Patient Instructions (Signed)

## 2015-08-18 NOTE — Progress Notes (Signed)
Electrophysiology Office Note   Date:  08/18/2015   ID:  Donald Grant, Donald Grant 1975/11/19, MRN [id number]  PCP:  [name], MD   Primary Electrophysiologist: [name], MD    Chief Complaint  Patient presents with  . Atrial Fibrillation     History of Present Illness: Donald Grant is a 40 y.o. male who presents today for electrophysiology evaluation.  S/p another abaltion 07/13/15 at [facility] for atypical atrial flutter.  Remained in afib until 5/19 but is now in sinus today.  He is unaware of being in sinus and was actually still quite symptomatic with sinus.  He has become very anxious over time.  Today, he denies symptoms of exertional chest pain, shortness of breath, orthopnea, PND, lower extremity edema, claudication, dizziness, presyncope, syncope, bleeding, or neurologic sequela. The patient is tolerating medications without difficulties and is otherwise without complaint today.    Past Medical History  Diagnosis Date  . GERD (gastroesophageal reflux disease)   . Dyslipidemia   . Atrial flutter [facility])     CTI ablation by Dr [name] 02/24/11  . Atrial tachycardia (HCC)     mapped to AV node by Dr [name], ablation not performed  . Atrial fibrillation (HCC)     PVI ablatioin at [facility] 04/21/14 and May 2016 Dr. [name]  . Premature ventricular contraction   . First degree AV block   . Gout     bilateral feet  . Lymphadenopathy of right cervical region ?Mono 04/16/2013  . Hx of cardiovascular stress test     Lexiscan Myoview (07/2013):  Inf and inferolateral scar, no ischemia, no gated  . Cardiomyopathy, nonischemic (HCC)     Medtronic BIVE ICD    Past Surgical History  Procedure Laterality Date  . Root canal    . Mouth surgery    . Atrial ablation surgery  02/24/11    CTI ablation by Dr [name]  . Cardioversion (bedside)  03/29/2011       . Cardioversion  03/29/2011    Procedure: CARDIOVERSION;  Surgeon: [name], MD;  Location: [facility] OR;  Service:  Cardiovascular;  Laterality: N/A;  . Examination under anesthesia  12/19/12    Anal Fistula  . Atrial flutter ablation N/A 02/24/2011    Procedure: ATRIAL FLUTTER ABLATION;  Surgeon: [name], MD;  Location: [facility] CATH LAB;  Service: Cardiovascular;  Laterality: N/A;  . Atrial fibrillation ablation  04/24/14, 08/13/14    PVI at [facility] by Dr [name], PVI with posterior wall Box and FIRM ablation performed  . Cardioversion N/A 02/25/2015    Procedure: CARDIOVERSION;  Surgeon: [name], MD;  Location: [facility] ENDOSCOPY;  Service: Cardiovascular;  Laterality: N/A;  . Electrophysiologic study N/A 06/21/2015    Procedure: Cardioversion;  Surgeon: [name], MD;  Location: [facility] INVASIVE CV LAB;  Service: Cardiovascular;  Laterality: N/A;  . Ablation of dysrhythmic focus  07/13/15    Atypical atrial flutter ablation at [facility] by Dr [name]     Current Outpatient Prescriptions  Medication Sig Dispense Refill  . acyclovir (ZOVIRAX) 400 MG tablet Take 400 mg by mouth 2 (two) times daily as needed (outbreak).     [name] allopurinol (ZYLOPRIM) 300 MG tablet Take 300 mg by mouth every morning.     [name] ALPRAZolam (XANAX) 0.5 MG tablet Take 0.5 tablets by mouth daily as needed. anxiety  1  . atorvastatin (LIPITOR) 20 MG tablet Take 20 mg by mouth every evening.     [name] b  complex vitamins capsule Take 1 capsule by mouth every evening.     . carvedilol (COREG) 6.25 MG tablet Take 1 tablet (6.25 mg total) by mouth 2 (two) times daily. 180 tablet 3  . cholecalciferol (VITAMIN D) 1000 UNITS tablet Take 1,000 Units by mouth every evening.     . dofetilide (TIKOSYN) 500 MCG capsule Take 1 capsule (500 mcg total) by mouth 2 (two) times daily. 180 capsule 3  . fish oil-omega-3 fatty acids 1000 MG capsule Take 2 g by mouth every evening.     . indomethacin (INDOCIN) 25 MG capsule Take 25 mg by mouth 3 (three) times daily as needed. gout    . magnesium oxide (MAG-OX) 400 (241.3 Mg) MG tablet Take 1 tablet by mouth every  morning.   11  . Multiple Vitamin (MULITIVITAMIN WITH MINERALS) TABS Take 1 tablet by mouth every evening.     [name] PRADAXA 150 MG CAPS capsule TAKE ONE CAPSULE BY MOUTH TWICE A DAY 60 capsule 11  . psyllium (METAMUCIL) 58.6 % packet Take 1 packet by mouth daily.    . sacubitril-valsartan (ENTRESTO) 49-51 MG Take 1 tablet by mouth 2 (two) times daily. 60 tablet 6  . spironolactone (ALDACTONE) 25 MG tablet TAKE 1/2 TABLET BY MOUTH ONCE DAILY 15 tablet 3   No current facility-administered medications for this visit.    Allergies:   Review of patient's allergies indicates no known allergies.   Social History:  The patient  reports that he quit smoking about 4 years ago. He has never used smokeless tobacco. He reports that he uses illicit drugs (Marijuana). He reports that he does not drink alcohol.   Family History:  The patient's family history includes Achondroplasia in his mother and son; Alcohol abuse in his mother; Dilated cardiomyopathy in his mother; Heart disease in his mother; Other in his maternal aunt; Sudden death in his maternal aunt, maternal grandmother, and mother.    ROS:  Please see the history of present illness.   All other systems are reviewed and negative.    PHYSICAL EXAM: VS:  BP 124/78 mmHg  Pulse 86  Ht 6\' 2"  (1.88 m)  Wt 252 lb 3.2 oz (114.397 kg)  BMI 32.37 kg/m2 , BMI Body mass index is 32.37 kg/(m^2). GEN: Well nourished, well developed, in no acute distress HEENT: normal Neck: no JVD, carotid bruits, or masses Cardiac: RRR; no murmurs, rubs, or gallops,no edema  Respiratory:  clear to auscultation bilaterally, normal work of breathing GI: soft, nontender, nondistended, + BS MS: no deformity or atrophy Skin: warm and dry, device pocket is well healed Neuro:  Strength and sensation are intact Psych: euthymic mood, full affect  EKG:  EKG is ordered today. The ekg ordered today shows AV paced 80 bpm, Qtc 502 msec  Device interrogation is reviewed today in  detail.  See PaceArt for details.   Recent Labs: 06/21/2015: BUN 8; Creatinine, Ser 0.68; Hemoglobin 14.7; Magnesium 2.1; Platelets 188; Potassium 4.1; Sodium 140    Lipid Panel  No results found for: CHOL, TRIG, HDL, CHOLHDL, VLDL, LDLCALC, LDLDIRECT   Wt Readings from Last 3 Encounters:  08/18/15 252 lb 3.2 oz (114.397 kg)  07/20/15 250 lb 12.8 oz (113.762 kg)  06/21/15 252 lb (114.306 kg)    ASSESSMENT AND PLAN:  1.  Persistent afib/ atypical atrial flutter No real improvement with prior ablations but is now holding sinus with tikosyn On pradaxa chads2vasc score is 1 I think that his very unusual channelopathy presents  afib mechanism not amenable to ablation. I would favor rate control long term should he have additional atrial fibrillation He has previously been asymptomatic with afib but appears to be developing symptoms, likely related to anxeity.  2. Nonischemic CM/ sick sinus syndrome, advanced AV block euvolemic today Genetic mutation discovered. Now is s/p MDT BIV ICD (has his bundle lead rather than LV lead) Normal device function  I have reprogrammed to his pacing only and corrected AV timing today  3. ETOH doing better with this lately  Return to see me in 3 months  Current medicines are reviewed at length with the patient today.   The patient does not have concerns regarding his medicines.  The following changes were made today:  none  Signed, [name], MD  08/18/2015 9:21 AM     [facility] [address] [place] [address] [phone] (office) [phone] (fax)

## 2015-08-25 DIAGNOSIS — I484 Atypical atrial flutter: Secondary | ICD-10-CM | POA: Insufficient documentation

## 2015-10-08 ENCOUNTER — Other Ambulatory Visit (HOSPITAL_COMMUNITY): Payer: Self-pay | Admitting: Internal Medicine

## 2015-11-04 ENCOUNTER — Other Ambulatory Visit (HOSPITAL_COMMUNITY): Payer: Self-pay | Admitting: Internal Medicine

## 2015-11-09 ENCOUNTER — Encounter: Payer: Self-pay | Admitting: Internal Medicine

## 2015-11-22 ENCOUNTER — Encounter: Payer: Self-pay | Admitting: Internal Medicine

## 2015-11-22 ENCOUNTER — Ambulatory Visit (INDEPENDENT_AMBULATORY_CARE_PROVIDER_SITE_OTHER): Payer: 59 | Admitting: Internal Medicine

## 2015-11-22 VITALS — BP 120/98 | HR 90 | Ht 74.0 in | Wt 257.4 lb

## 2015-11-22 DIAGNOSIS — I429 Cardiomyopathy, unspecified: Secondary | ICD-10-CM

## 2015-11-22 DIAGNOSIS — Z95810 Presence of automatic (implantable) cardiac defibrillator: Secondary | ICD-10-CM

## 2015-11-22 DIAGNOSIS — I10 Essential (primary) hypertension: Secondary | ICD-10-CM | POA: Diagnosis not present

## 2015-11-22 DIAGNOSIS — I4819 Other persistent atrial fibrillation: Secondary | ICD-10-CM

## 2015-11-22 DIAGNOSIS — I481 Persistent atrial fibrillation: Secondary | ICD-10-CM

## 2015-11-22 LAB — BASIC METABOLIC PANEL
BUN: 11 mg/dL (ref 7–25)
CALCIUM: 9.1 mg/dL (ref 8.6–10.3)
CHLORIDE: 103 mmol/L (ref 98–110)
CO2: 26 mmol/L (ref 20–31)
CREATININE: 0.71 mg/dL (ref 0.60–1.35)
GLUCOSE: 96 mg/dL (ref 65–99)
Potassium: 4.2 mmol/L (ref 3.5–5.3)
Sodium: 139 mmol/L (ref 135–146)

## 2015-11-22 LAB — CBC WITH DIFFERENTIAL/PLATELET
BASOS ABS: 0 {cells}/uL (ref 0–200)
Basophils Relative: 0 %
EOS PCT: 3 %
Eosinophils Absolute: 198 cells/uL (ref 15–500)
HEMATOCRIT: 42.2 % (ref 38.5–50.0)
HEMOGLOBIN: 14.7 g/dL (ref 13.2–17.1)
LYMPHS ABS: 1650 {cells}/uL (ref 850–3900)
Lymphocytes Relative: 25 %
MCH: 31.5 pg (ref 27.0–33.0)
MCHC: 34.8 g/dL (ref 32.0–36.0)
MCV: 90.6 fL (ref 80.0–100.0)
MONO ABS: 594 {cells}/uL (ref 200–950)
MPV: 8.8 fL (ref 7.5–12.5)
Monocytes Relative: 9 %
Neutro Abs: 4158 cells/uL (ref 1500–7800)
Neutrophils Relative %: 63 %
Platelets: 211 10*3/uL (ref 140–400)
RBC: 4.66 MIL/uL (ref 4.20–5.80)
RDW: 13.1 % (ref 11.0–15.0)
WBC: 6.6 10*3/uL (ref 3.8–10.8)

## 2015-11-22 LAB — MAGNESIUM: MAGNESIUM: 1.9 mg/dL (ref 1.5–2.5)

## 2015-11-22 NOTE — Progress Notes (Signed)
Electrophysiology Office Note   Date:  11/22/2015   ID:  Cayle, Bailey [birth year], MRN [id number]  PCP:  [name], MD   Primary Electrophysiologist: [name], MD    Chief Complaint  Patient presents with  . Atrial Fibrillation     History of Present Illness: Donald Grant is a 40 y.o. male who presents today for electrophysiology evaluation.  S/p another abaltion 07/13/15 at [facility] for atypical atrial flutter.  Intermittent sinus rhythm but currently in an atypical atrial flutter.  He is unaware of afib.  He says that he has decided to not pay attention to his arrhythmia burden and is doing better overall.  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 The patient is tolerating medications without difficulties and is otherwise without complaint today.    Past Medical History:  Diagnosis Date  . Atrial fibrillation (HCC)    PVI ablatioin at [facility] 04/21/14 and May 2016 Dr. [name]  . Atrial flutter [facility])    CTI ablation by Dr [name] 02/24/11  . Atrial tachycardia (HCC)    mapped to AV node by Dr [name], ablation not performed  . Cardiomyopathy, nonischemic (HCC)    Medtronic BIVE ICD   . Dyslipidemia   . First degree AV block   . GERD (gastroesophageal reflux disease)   . Gout    bilateral feet  . Hx of cardiovascular stress test    Lexiscan Myoview (07/2013):  Inf and inferolateral scar, no ischemia, no gated  . Lymphadenopathy of right cervical region ?Mono 04/16/2013  . Premature ventricular contraction    Past Surgical History:  Procedure Laterality Date  . ABLATION OF DYSRHYTHMIC FOCUS  07/13/15   Atypical atrial flutter ablation at [facility] by Dr [name]  . ATRIAL ABLATION SURGERY  02/24/11   CTI ablation by Dr [name]  . ATRIAL FIBRILLATION ABLATION  04/24/14, 08/13/14   PVI at  [facility] by Dr [name], PVI with posterior wall Box and FIRM ablation performed  . ATRIAL FLUTTER ABLATION N/A 02/24/2011   Procedure: ATRIAL FLUTTER ABLATION;  Surgeon: [name], MD;  Location: [facility] CATH LAB;  Service: Cardiovascular;  Laterality: N/A;  . CARDIOVERSION  03/29/2011   Procedure: CARDIOVERSION;  Surgeon: [name], MD;  Location: [facility] OR;  Service: Cardiovascular;  Laterality: N/A;  . CARDIOVERSION N/A 02/25/2015   Procedure: CARDIOVERSION;  Surgeon: [name], MD;  Location: [facility] ENDOSCOPY;  Service: Cardiovascular;  Laterality: N/A;  . CARDIOVERSION (BEDSIDE)  03/29/2011      . ELECTROPHYSIOLOGIC STUDY N/A 06/21/2015   Procedure: Cardioversion;  Surgeon: [name], MD;  Location: [facility] INVASIVE CV LAB;  Service: Cardiovascular;  Laterality: N/A;  . EXAMINATION UNDER ANESTHESIA  12/19/12   Anal Fistula  . MOUTH SURGERY    . ROOT CANAL       Current Outpatient Prescriptions  Medication Sig Dispense Refill  . acyclovir (ZOVIRAX) 400 MG tablet Take 400 mg by mouth 2 (two) times daily as needed (outbreak).     [name] allopurinol (ZYLOPRIM) 300 MG tablet Take 300 mg by mouth every morning.     [name] ALPRAZolam (XANAX) 0.5 MG tablet Take 0.5 tablets by mouth daily as needed. anxiety  1  . atorvastatin (LIPITOR) 20 MG tablet Take 20 mg by mouth every evening.     [name] b complex vitamins capsule Take 1 capsule by mouth  every evening.     . carvedilol (COREG) 6.25 MG tablet Take 1 tablet (6.25 mg total) by mouth 2 (two) times daily. 180 tablet 3  . cholecalciferol (VITAMIN D) 1000 UNITS tablet Take 1,000 Units by mouth every evening.     . dofetilide (TIKOSYN) 500 MCG capsule Take 1 capsule (500 mcg total) by mouth 2 (two) times daily. 180 capsule 3  . ENTRESTO 49-51 MG TAKE 1 TABLET BY MOUTH TWICE A DAY 60 tablet 6  . fish oil-omega-3 fatty acids 1000 MG capsule Take 2 g by mouth every evening.     . indomethacin (INDOCIN) 25 MG capsule Take 25 mg by mouth 3 (three) times daily as  needed. gout    . magnesium oxide (MAG-OX) 400 (241.3 Mg) MG tablet Take 1 tablet by mouth every morning.   11  . Multiple Vitamin (MULITIVITAMIN WITH MINERALS) TABS Take 1 tablet by mouth every evening.     [name]. PRADAXA 150 MG CAPS capsule TAKE ONE CAPSULE BY MOUTH TWICE A DAY 60 capsule 11  . psyllium (METAMUCIL) 58.6 % packet Take 1 packet by mouth daily.    [name]. spironolactone (ALDACTONE) 25 MG tablet TAKE 1/2 TABLET BY MOUTH ONCE DAILY 15 tablet 3   No current facility-administered medications for this visit.     Allergies:   Review of patient's allergies indicates no known allergies.   Social History:  The patient  reports that he quit smoking about 5 years ago. He has never used smokeless tobacco. He reports that he uses drugs, including Marijuana. He reports that he does not drink alcohol.   Family History:  The patient's family history includes Achondroplasia in his mother and son; Alcohol abuse in his mother; Dilated cardiomyopathy in his mother; Heart disease in his mother; Other in his maternal aunt; Sudden death in his maternal aunt, maternal grandmother, and mother.    ROS:  Please see the history of present illness.   All other systems are reviewed and negative.    PHYSICAL EXAM: VS:  BP (!) 120/98   Pulse 90   Ht 6\' 2"  (1.88 m)   Wt 257 lb 6.4 oz (116.8 kg)   BMI 33.05 kg/m  , BMI Body mass index is 33.05 kg/m. GEN: Well nourished, well developed, in no acute distress  HEENT: normal  Neck: no JVD, carotid bruits, or masses Cardiac: RRR (paced); no murmurs, rubs, or gallops,no edema  Respiratory:  clear to auscultation bilaterally, normal work of breathing GI: soft, nontender, nondistended, + BS MS: no deformity or atrophy  Skin: warm and dry, device pocket is well healed Neuro:  Strength and sensation are intact Psych: euthymic mood, full affect  EKG:  EKG is ordered today. The ekg ordered today shows atypical atrial flutter, V paced, QTc stable  Device  interrogation is reviewed today in detail.  See PaceArt for details.   Recent Labs: 06/21/2015: BUN 8; Creatinine, Ser 0.68; Magnesium 2.1; Potassium 4.1; Sodium 140 11/22/2015: Hemoglobin 14.7; Platelets 211    Lipid Panel  No results found for: CHOL, TRIG, HDL, CHOLHDL, VLDL, LDLCALC, LDLDIRECT   Wt Readings from Last 3 Encounters:  11/22/15 257 lb 6.4 oz (116.8 kg)  08/18/15 252 lb 3.2 oz (114.4 kg)  07/20/15 250 lb 12.8 oz (113.8 kg)    ASSESSMENT AND PLAN:  1.  Persistent afib/ atypical atrial flutter No real improvement with prior ablations   On pradaxa chads2vasc score is 1 I think that his very unusual channelopathy presents afib mechanism  not amenable to ablation. I would favor rate control long term. His atypical atrial flutter was below mode switch rate (CL 350 msec) today.  I have adjusted his mode switch rate.  I have also turned preferential pacing off as this was leading to inappropriate atrial pacing.  Will follow clinically.   2. Nonischemic CM/ sick sinus syndrome, advanced AV block euvolemic today Genetic mutation discovered. Now is s/p MDT BIV ICD (has his bundle lead rather than LV lead) Normal device function   3. ETOH doing better with this lately  Return to see me in 3 months  Current medicines are reviewed at length with the patient today.   The patient does not have concerns regarding his medicines.  The following changes were made today:  none  Signed, [name], MD  11/22/2015 10:24 PM     [facility] [address] [place] [address] [phone] (office) [phone] (fax)

## 2015-11-22 NOTE — Patient Instructions (Signed)
Medication Instructions:  Your physician recommends that you continue on your current medications as directed. Please refer to the Current Medication list given to you today.   Labwork: Your physician recommends that you return for lab work today: BMP/CBC/Mag     Testing/Procedures: None ordered   Follow-Up: Your physician recommends that you schedule a follow-up appointment in: 3 months with Dr Johney Frame   Any Other Special Instructions Will Be Listed Below (If Applicable).     If you need a refill on your cardiac medications before your next appointment, please call your pharmacy.

## 2016-01-16 ENCOUNTER — Other Ambulatory Visit: Payer: Self-pay | Admitting: Internal Medicine

## 2016-01-16 DIAGNOSIS — I482 Chronic atrial fibrillation, unspecified: Secondary | ICD-10-CM

## 2016-02-04 ENCOUNTER — Telehealth (HOSPITAL_COMMUNITY): Payer: Self-pay | Admitting: Pharmacist

## 2016-02-04 NOTE — Telephone Encounter (Signed)
Entresto 49-51 mg BID PA approved by [employer] through 03/26/16.   Tyler Deis. Bonnye Fava, PharmD, BCPS, CPP Clinical Pharmacist Pager: [phone] Phone: [phone] 02/04/2016 10:29 AM

## 2016-02-11 ENCOUNTER — Encounter (HOSPITAL_COMMUNITY): Payer: Self-pay

## 2016-02-11 ENCOUNTER — Ambulatory Visit (HOSPITAL_COMMUNITY)
Admission: RE | Admit: 2016-02-11 | Discharge: 2016-02-11 | Disposition: A | Discharge status: Home / Self Care | Payer: 59 | Source: Ambulatory Visit | Attending: Cardiology | Admitting: Cardiology

## 2016-02-11 VITALS — BP 132/80 | HR 92 | Wt 257.5 lb

## 2016-02-11 DIAGNOSIS — I429 Cardiomyopathy, unspecified: Secondary | ICD-10-CM | POA: Insufficient documentation

## 2016-02-11 DIAGNOSIS — Z95810 Presence of automatic (implantable) cardiac defibrillator: Secondary | ICD-10-CM | POA: Insufficient documentation

## 2016-02-11 DIAGNOSIS — I4819 Other persistent atrial fibrillation: Secondary | ICD-10-CM

## 2016-02-11 DIAGNOSIS — I481 Persistent atrial fibrillation: Secondary | ICD-10-CM | POA: Diagnosis not present

## 2016-02-11 DIAGNOSIS — I493 Ventricular premature depolarization: Secondary | ICD-10-CM | POA: Insufficient documentation

## 2016-02-11 DIAGNOSIS — M109 Gout, unspecified: Secondary | ICD-10-CM | POA: Diagnosis not present

## 2016-02-11 DIAGNOSIS — I484 Atypical atrial flutter: Secondary | ICD-10-CM | POA: Insufficient documentation

## 2016-02-11 DIAGNOSIS — Z79899 Other long term (current) drug therapy: Secondary | ICD-10-CM | POA: Insufficient documentation

## 2016-02-11 DIAGNOSIS — I10 Essential (primary) hypertension: Secondary | ICD-10-CM | POA: Insufficient documentation

## 2016-02-11 DIAGNOSIS — Z7902 Long term (current) use of antithrombotics/antiplatelets: Secondary | ICD-10-CM | POA: Insufficient documentation

## 2016-02-11 DIAGNOSIS — I480 Paroxysmal atrial fibrillation: Secondary | ICD-10-CM | POA: Insufficient documentation

## 2016-02-11 DIAGNOSIS — Z8241 Family history of sudden cardiac death: Secondary | ICD-10-CM | POA: Insufficient documentation

## 2016-02-11 LAB — BASIC METABOLIC PANEL
Anion gap: 8 (ref 5–15)
BUN: 9 mg/dL (ref 6–20)
CHLORIDE: 103 mmol/L (ref 101–111)
CO2: 25 mmol/L (ref 22–32)
CREATININE: 0.71 mg/dL (ref 0.61–1.24)
Calcium: 9.2 mg/dL (ref 8.9–10.3)
GFR calc Af Amer: >60 mL/min (ref >60)
GFR calc non Af Amer: >60 mL/min (ref >60)
Glucose, Bld: 112 mg/dL — ABNORMAL HIGH (ref 65–99)
Potassium: 4.1 mmol/L (ref 3.5–5.1)
Sodium: 136 mmol/L (ref 135–145)

## 2016-02-11 LAB — CBC
HEMATOCRIT: 43 % (ref 39.0–52.0)
HEMOGLOBIN: 15.4 g/dL (ref 13.0–17.0)
MCH: 32.1 pg (ref 26.0–34.0)
MCHC: 35.8 g/dL (ref 30.0–36.0)
MCV: 89.6 fL (ref 78.0–100.0)
Platelets: 185 10*3/uL (ref 150–400)
RBC: 4.8 MIL/uL (ref 4.22–5.81)
RDW: 12.7 % (ref 11.5–15.5)
WBC: 6.7 10*3/uL (ref 4.0–10.5)

## 2016-02-11 LAB — BRAIN NATRIURETIC PEPTIDE: B Natriuretic Peptide: 52.2 pg/mL (ref 0.0–100.0)

## 2016-02-11 LAB — MAGNESIUM: Magnesium: 2 mg/dL (ref 1.7–2.4)

## 2016-02-11 NOTE — Patient Instructions (Signed)
Labs today (will call for abnormal results, otherwise no news is good news)  Echo has been ordered  Follow up in 6 months

## 2016-02-12 NOTE — Progress Notes (Signed)
Patient ID: Donald Grant, male   DOB: 07/29/1975, 40 y.o.   MRN: [id number] PCP: Dr. Duaine DredgeBlomgren  40 yo with history of cardiomyopathy, heavy ETOH use, paroxysmal atrial fibrillation and flutter with bradycardia, and frequent PVCs.  Patient has a long EP history.  He has paroxysmal atrial fibrillation with slow ventricular response.  He had atrial flutter ablation in 2012 and also has atrial tachycardia with focus near the AV node that has not been ablated.  He had another ablation of atypical atrial flutter in 4/17.  He had a holter in 5/15 showing a 3.8 second pause, predominant atrial fibrillation, and 39% PVCs (some of this was likely atrial fibrillation with aberrancy).  He had runs of NSVT.  He is now on dofetilide. He had a Medtronic CRT-D device placed with his bundle lead.  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  Cardiac MRI in 8/15 did not show evidence for ARVC.    He was referred to CHF clinic initially for management of his cardiomyopathy.  Echo in 7/15 suggested EF 30% but difficult to quantify due to PVCs.  Cardiac MRI in 8/15 showed EF 45% with normal RV and a nonspecific RV insertion site delayed enhancement pattern.  He stopped ETOH completely.  Last echo in 2/17 showed EF 50-55%.    Recently he has been doing very well. He feels some palpitations but does not worry about them like he did in the past.  No significant exertional dyspnea.  No orthopnea/PND.  No chest pain.   Optivol: Fluid index < threshold, stable impedance, last couple of days he has had atrial arrhythmias.    ECG: ?underlying atypical atrial flutter, BiV pacing, QTc 519 msec  Labs (5/15): K 3.9, creatinine 0.7 Labs (8/15): creatinine 0.9 Labs (9/15): K 4.3, creatinine 0.83, pBNP 700 Labs (8/17): K 4.2, creatinine 0.71, HCT 42.2  PMH: 1. Prior heavy ETOH use.  2. Atrial fibrillation: Paroxysmal.  CHADSVASC = 2, he is on Pradaxa.   3. HTN 4. Atrial flutter:  s/p ablation 11/12.  He had ablation of atypical atrial flutter again in 4/17.  5. Atrial tachycardia: Mapped to AV node, not ablated.  6. Ventricular arrhythmias: NSVT, also very frequent PVCs. Some of beats that seem to be PVCs are likely aberrantly conducted atrial fibrillation.  Holter (5/15) with 3.8 second pause, atrial fibrillation predominantly, 39% PVCs (some of this likely is atrial fibrillation with aberrancy), runs of WCT (suspected NSVT).  7. Cardiomyopathy: Echo (7/15) with EF 30%, mild diffuse hypokinesis, mild LV dilation.  Cardiac MRI (8/15) with moderate LV dilation, EF 45%, diffuse hypokinesis, normal RV with no evidence for ARVC, delayed enhancement at the RV insertion sites which is a nonspecific pattern.  Lexiscan Cardiolite (5/15) with mild scar in the inferior and inferoseptal walls, no ischemia, not gated due to PVCs.  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   - Medtronic CRT-D with His bundle lead.  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  No tobacco.  Occasional marijuana.  Never used cocaine.  Prior heavy ETOH but has quit.   FH: Mother with SCD in her 6240s and dilated cardiomyopathy.  Maternal grandmother with SCD in her 2730s, great-aunt with SCD, sister with PVCs.   ROS: All systems reviewed and negative except as per HPI.   Current Outpatient Prescriptions  Medication Sig Dispense Refill  . allopurinol (ZYLOPRIM) 300 MG tablet Take 300 mg by  mouth every morning.     [name] ALPRAZolam (XANAX) 0.5 MG tablet Take 0.5 tablets by mouth daily as needed. anxiety  1  . atorvastatin (LIPITOR) 20 MG tablet Take 20 mg by mouth every evening.     [name] b complex vitamins capsule Take 1 capsule by mouth every evening.     . carvedilol (COREG) 6.25 MG tablet TAKE 1 TABLET (6.25 MG TOTAL) BY MOUTH 2 (TWO) TIMES DAILY. 180 tablet 2  . cholecalciferol (VITAMIN D) 1000 UNITS tablet Take 1,000 Units by mouth every evening.     . dofetilide (TIKOSYN)  500 MCG capsule Take 1 capsule (500 mcg total) by mouth 2 (two) times daily. 180 capsule 3  . ENTRESTO 49-51 MG TAKE 1 TABLET BY MOUTH TWICE A DAY 60 tablet 6  . fish oil-omega-3 fatty acids 1000 MG capsule Take 2 g by mouth every evening.     . indomethacin (INDOCIN) 25 MG capsule Take 25 mg by mouth 3 (three) times daily as needed. gout    . magnesium oxide (MAG-OX) 400 (241.3 Mg) MG tablet Take 1 tablet by mouth every morning.   11  . Multiple Vitamin (MULITIVITAMIN WITH MINERALS) TABS Take 1 tablet by mouth every evening.     [name] PRADAXA 150 MG CAPS capsule TAKE ONE CAPSULE BY MOUTH TWICE A DAY 60 capsule 11  . psyllium (METAMUCIL) 58.6 % packet Take 1 packet by mouth daily.    [name] spironolactone (ALDACTONE) 25 MG tablet TAKE 1/2 TABLET BY MOUTH ONCE DAILY 15 tablet 3  . acyclovir (ZOVIRAX) 400 MG tablet Take 400 mg by mouth 2 (two) times daily as needed (outbreak).      No current facility-administered medications for this encounter.     BP 132/80   Pulse 92   Wt 257 lb 8 oz (116.8 kg)   SpO2 100%   BMI 33.06 kg/m  General: NAD Neck: No JVD, no thyromegaly or thyroid nodule.  Lungs: Clear to auscultation bilaterally with normal respiratory effort. CV: Nondisplaced PMI.  Heart regular S1/S2, no S3/S4, no murmur.  No peripheral edema.  No carotid bruit.  Normal pedal pulses.  Abdomen: Soft, nontender, no hepatosplenomegaly, no distention.  Skin: Intact without lesions or rashes.  Neurologic: Alert and oriented x 3.  Psych: Normal affect. Extremities: No clubbing or cyanosis.  HEENT: Normal.   Assessment/Plan:  1. Cardiomyopathy:  Most recent echo showed EF improved to 50-55%.  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  He has a [employer] CRT-D device with his bundle lead.  - Continue to avoid heavy ETOH. - Continue spironolactone, Entresto, and  Coreg at current doses. - Check BMET, BNP. - Repeat echo in 2/18.  2. Atrial fibrillation: Paroxysmal atrial fibrillation with bradycardic ventricular response, now has CRT device.   - He is on Pradaxa with no bleeding sequelae.  - Continue dofetilide, paced QTc < 550 msec.  BMET/Mg today.  3. Atrial flutter: s/p ablation most recently in 4/17.  He appears to be in an atypical atrial flutter today.  Device interrogation shows occasional episodes of atrial arrhythmias.    4. Atrial tachycardia: Prior history, mapped to area near AV node.  5. 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    Marca Ancona 02/12/2016

## 2016-02-19 ENCOUNTER — Other Ambulatory Visit: Payer: Self-pay | Admitting: Internal Medicine

## 2016-02-21 ENCOUNTER — Encounter: Payer: Self-pay | Admitting: Internal Medicine

## 2016-02-28 ENCOUNTER — Encounter: Payer: Self-pay | Admitting: Internal Medicine

## 2016-02-28 ENCOUNTER — Ambulatory Visit (INDEPENDENT_AMBULATORY_CARE_PROVIDER_SITE_OTHER): Payer: 59 | Admitting: Internal Medicine

## 2016-02-28 VITALS — BP 126/92 | HR 79 | Ht 74.0 in | Wt 257.0 lb

## 2016-02-28 DIAGNOSIS — Z95810 Presence of automatic (implantable) cardiac defibrillator: Secondary | ICD-10-CM | POA: Diagnosis not present

## 2016-02-28 DIAGNOSIS — I10 Essential (primary) hypertension: Secondary | ICD-10-CM

## 2016-02-28 DIAGNOSIS — I481 Persistent atrial fibrillation: Secondary | ICD-10-CM

## 2016-02-28 DIAGNOSIS — I4819 Other persistent atrial fibrillation: Secondary | ICD-10-CM

## 2016-02-28 DIAGNOSIS — I420 Dilated cardiomyopathy: Secondary | ICD-10-CM | POA: Diagnosis not present

## 2016-02-28 LAB — CUP PACEART INCLINIC DEVICE CHECK
Battery Voltage: 2.92 V
Brady Statistic AP VP Percent: 84.07 %
Brady Statistic AP VS Percent: 0.52 %
Brady Statistic AS VP Percent: 15.18 %
Brady Statistic RA Percent Paced: 82.37 %
HIGH POWER IMPEDANCE MEASURED VALUE: 68 Ohm
Implantable Lead Implant Date: 20160628
Implantable Lead Implant Date: 20160628
Implantable Lead Location: 753858
Implantable Lead Location: 753859
Implantable Lead Model: 3830
Implantable Lead Model: 5076
Lead Channel Impedance Value: 247 Ohm
Lead Channel Impedance Value: 285 Ohm
Lead Channel Impedance Value: 418 Ohm
Lead Channel Impedance Value: 456 Ohm
Lead Channel Impedance Value: 456 Ohm
Lead Channel Pacing Threshold Amplitude: 0.75 V
Lead Channel Pacing Threshold Pulse Width: 0.4 ms
Lead Channel Pacing Threshold Pulse Width: 0.4 ms
Lead Channel Sensing Intrinsic Amplitude: 3.5 mV
Lead Channel Setting Pacing Amplitude: 0.5 V
Lead Channel Setting Pacing Amplitude: 2 V
Lead Channel Setting Pacing Pulse Width: 0.8 ms
Lead Channel Setting Sensing Sensitivity: 0.3 mV
MDC IDC LEAD IMPLANT DT: 20160628
MDC IDC LEAD LOCATION: 753860
MDC IDC MSMT BATTERY REMAINING LONGEVITY: 43 mo
MDC IDC MSMT LEADCHNL LV PACING THRESHOLD AMPLITUDE: 1 V
MDC IDC MSMT LEADCHNL LV PACING THRESHOLD PULSEWIDTH: 0.8 ms
MDC IDC MSMT LEADCHNL RA PACING THRESHOLD AMPLITUDE: 0.5 V
MDC IDC MSMT LEADCHNL RV IMPEDANCE VALUE: 361 Ohm
MDC IDC PG IMPLANT DT: 20160628
MDC IDC SESS DTM: 20171204181008
MDC IDC SET LEADCHNL LV PACING AMPLITUDE: 2.5 V
MDC IDC SET LEADCHNL RV PACING PULSEWIDTH: 0.03 ms
MDC IDC STAT BRADY AS VS PERCENT: 0.23 %
MDC IDC STAT BRADY RV PERCENT PACED: 99.21 %

## 2016-02-28 NOTE — Progress Notes (Signed)
Electrophysiology Office Note   Date:  02/28/2016   ID:  Donald Grant, Donald Grant 18-Jul-1975, MRN [id number]  PCP:  [name], MD   Primary Electrophysiologist: [name], MD    Chief Complaint  Patient presents with  . Atrial Fibrillation     History of Present Illness: Donald Grant is a 40 y.o. male who presents today for electrophysiology evaluation.  He is doing well.  Minimal symptoms with atrial arrhythmias.  CHF is controlled.  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 The patient is tolerating medications without difficulties and is otherwise without complaint today.    Past Medical History:  Diagnosis Date  . Atrial fibrillation (HCC)    PVI ablatioin at [facility] 04/21/14 and May 2016 Dr. [name]  . Atrial flutter [facility])    CTI ablation by Dr [name] 02/24/11  . Atrial tachycardia (HCC)    mapped to AV node by Dr [name], ablation not performed  . Cardiomyopathy, nonischemic (HCC)    Medtronic BIVE ICD   . Dyslipidemia   . First degree AV block   . GERD (gastroesophageal reflux disease)   . Gout    bilateral feet  . Hx of cardiovascular stress test    Lexiscan Myoview (07/2013):  Inf and inferolateral scar, no ischemia, no gated  . Lymphadenopathy of right cervical region ?[place] 04/16/2013  . Premature ventricular contraction    Past Surgical History:  Procedure Laterality Date  . ABLATION OF DYSRHYTHMIC FOCUS  07/13/15   Atypical atrial flutter ablation at [facility] by Dr [name]  . ATRIAL ABLATION SURGERY  02/24/11   CTI ablation by Dr [name]  . ATRIAL FIBRILLATION ABLATION  04/24/14, 08/13/14   PVI at [facility] by Dr [name], PVI with posterior wall Box and FIRM ablation performed  . ATRIAL FLUTTER ABLATION N/A 02/24/2011   Procedure: ATRIAL FLUTTER ABLATION;  Surgeon: [name],  MD;  Location: [facility] CATH LAB;  Service: Cardiovascular;  Laterality: N/A;  . CARDIOVERSION  03/29/2011   Procedure: CARDIOVERSION;  Surgeon: [name], MD;  Location: [facility] OR;  Service: Cardiovascular;  Laterality: N/A;  . CARDIOVERSION N/A 02/25/2015   Procedure: CARDIOVERSION;  Surgeon: [name], MD;  Location: [facility] ENDOSCOPY;  Service: Cardiovascular;  Laterality: N/A;  . CARDIOVERSION (BEDSIDE)  03/29/2011      . ELECTROPHYSIOLOGIC STUDY N/A 06/21/2015   Procedure: Cardioversion;  Surgeon: [name], MD;  Location: [facility] INVASIVE CV LAB;  Service: Cardiovascular;  Laterality: N/A;  . EXAMINATION UNDER ANESTHESIA  12/19/12   Anal Fistula  . MOUTH SURGERY    . ROOT CANAL       Current Outpatient Prescriptions  Medication Sig Dispense Refill  . acyclovir (ZOVIRAX) 400 MG tablet Take 400 mg by mouth 2 (two) times daily as needed (outbreak).     [name] allopurinol (ZYLOPRIM) 300 MG tablet Take 300 mg by mouth every morning.     [name] ALPRAZolam (XANAX) 0.5 MG tablet Take 0.5 tablets by mouth daily as needed. anxiety  1  . amoxicillin (AMOXIL) 875 MG tablet Take 875 mg by mouth 2 (two) times daily. for 10 days  0  . atorvastatin (LIPITOR) 20 MG tablet Take 20 mg by mouth every evening.     [name] b complex vitamins capsule Take 1 capsule by mouth every evening.     . carvedilol (COREG) 6.25 MG tablet  TAKE 1 TABLET (6.25 MG TOTAL) BY MOUTH 2 (TWO) TIMES DAILY. 180 tablet 2  . cholecalciferol (VITAMIN D) 1000 UNITS tablet Take 1,000 Units by mouth every evening.     . dofetilide (TIKOSYN) 500 MCG capsule Take 1 capsule (500 mcg total) by mouth 2 (two) times daily. 180 capsule 3  . ENTRESTO 49-51 MG TAKE 1 TABLET BY MOUTH TWICE A DAY 60 tablet 6  . fish oil-omega-3 fatty acids 1000 MG capsule Take 2 g by mouth every evening.     . indomethacin (INDOCIN) 25 MG capsule Take 25 mg by mouth 3 (three) times daily as needed. gout    . magnesium oxide (MAG-OX) 400 (241.3 Mg) MG tablet Take 1 tablet by mouth  every morning.   11  . Multiple Vitamin (MULITIVITAMIN WITH MINERALS) TABS Take 1 tablet by mouth every evening.     [name] PRADAXA 150 MG CAPS capsule TAKE ONE CAPSULE BY MOUTH TWICE A DAY 60 capsule 7  . psyllium (METAMUCIL) 58.6 % packet Take 1 packet by mouth daily.    [name] spironolactone (ALDACTONE) 25 MG tablet TAKE 1/2 TABLET BY MOUTH ONCE DAILY 15 tablet 3   No current facility-administered medications for this visit.     Allergies:   Patient has no known allergies.   Social History:  The patient  reports that he quit smoking about 5 years ago. He has never used smokeless tobacco. He reports that he uses drugs, including Marijuana. He reports that he does not drink alcohol.   Family History:  The patient's family history includes Achondroplasia in his mother and son; Alcohol abuse in his mother; Dilated cardiomyopathy in his mother; Heart disease in his mother; Other in his maternal aunt; Sudden death in his maternal aunt, maternal grandmother, and mother.    ROS:  Please see the history of present illness.   All other systems are reviewed and negative.    PHYSICAL EXAM: VS:  BP (!) 126/92   Pulse 79   Ht 6\' 2"  (1.88 m)   Wt 257 lb (116.6 kg)   BMI 33.00 kg/m  , BMI Body mass index is 33 kg/m. GEN: Well nourished, well developed, in no acute distress  HEENT: normal  Neck: no JVD, carotid bruits, or masses Cardiac: RRR (paced); no murmurs, rubs, or gallops,no edema  Respiratory:  clear to auscultation bilaterally, normal work of breathing GI: soft, nontender, nondistended, + BS MS: no deformity or atrophy  Skin: warm and dry, device pocket is well healed Neuro:  Strength and sensation are intact Psych: euthymic mood, full affect  EKG:  EKG is ordered today. The ekg ordered today shows afib, V paced.  Device interrogation is reviewed today in detail.  See PaceArt for details.   Recent Labs: 02/11/2016: B Natriuretic Peptide 52.2; BUN 9; Creatinine, Ser 0.71; Hemoglobin  15.4; Magnesium 2.0; Platelets 185; Potassium 4.1; Sodium 136    Lipid Panel  No results found for: CHOL, TRIG, HDL, CHOLHDL, VLDL, LDLCALC, LDLDIRECT   Wt Readings from Last 3 Encounters:  02/28/16 257 lb (116.6 kg)  02/11/16 257 lb 8 oz (116.8 kg)  11/22/15 257 lb 6.4 oz (116.8 kg)    ASSESSMENT AND PLAN:  1.  Persistent afib/ atypical atrial flutter No real improvement with prior ablations   On pradaxa chads2vasc score is 1 I think that his very unusual channelopathy presents afib mechanism not amenable to ablation. I would favor rate control long term. AF 14% burden Recent labs reviewed  2. Nonischemic  CM/ sick sinus syndrome, advanced AV block euvolemic today Genetic mutation discovered. Now is s/p MDT BIV ICD (has his bundle lead rather than LV lead) Normal device function   3. ETOH doing better with this lately  [facility] I will follow closely with carelink Return to see EP NP in 6 months He will contact my office if problems arise in the interim.  Current medicines are reviewed at length with the patient today.   The patient does not have concerns regarding his medicines.  The following changes were made today:  none  Signed, Hillis RangeJames Shean Gerding, MD  02/28/2016 12:34 PM     [facility] [address] [place] [address] [phone] (office) [phone] (fax)

## 2016-02-28 NOTE — Patient Instructions (Signed)
Medication Instructions:  Your physician recommends that you continue on your current medications as directed. Please refer to the Current Medication list given to you today.   Labwork: None ordered   Testing/Procedures: None ordered   Follow-Up: Remote monitoring is used to monitor your  ICD from home. This monitoring reduces the number of office visits required to check your device to one time per year. It allows [place] to keep an eye on the functioning of your device to ensure it is working properly. You are scheduled for a device check from home on 05/29/16. You may send your transmission at any time that day. If you have a wireless device, the transmission will be sent automatically. After your physician reviews your transmission, you will receive a postcard with your next transmission date.  Your physician wants you to follow-up in: 6 months with Gypsy Balsam, NP You will receive a reminder letter in the mail two months in advance. If you don't receive a letter, please call our office to schedule the follow-up appointment.     Any Other Special Instructions Will Be Listed Below (If Applicable).     If you need a refill on your cardiac medications before your next appointment, please call your pharmacy.

## 2016-03-03 ENCOUNTER — Other Ambulatory Visit: Payer: Self-pay | Admitting: Internal Medicine

## 2016-03-07 ENCOUNTER — Other Ambulatory Visit (HOSPITAL_COMMUNITY): Payer: Self-pay | Admitting: Internal Medicine

## 2016-04-10 ENCOUNTER — Telehealth (HOSPITAL_COMMUNITY): Payer: Self-pay | Admitting: Pharmacist

## 2016-04-10 NOTE — Telephone Encounter (Signed)
Entresto 49-51 mg BID PA approved by [place] commercial through 03/26/17.   Tyler Deis. Bonnye Fava, PharmD, BCPS, CPP Clinical Pharmacist Pager: [phone] Phone: [phone] 04/10/2016 2:45 PM

## 2016-04-17 ENCOUNTER — Other Ambulatory Visit: Payer: Self-pay | Admitting: Internal Medicine

## 2016-05-01 ENCOUNTER — Ambulatory Visit (HOSPITAL_COMMUNITY)
Admission: RE | Admit: 2016-05-01 | Discharge: 2016-05-01 | Disposition: A | Discharge status: Home / Self Care | Payer: 59 | Source: Ambulatory Visit | Attending: Cardiology | Admitting: Cardiology

## 2016-05-01 DIAGNOSIS — I071 Rheumatic tricuspid insufficiency: Secondary | ICD-10-CM | POA: Diagnosis not present

## 2016-05-01 DIAGNOSIS — I440 Atrioventricular block, first degree: Secondary | ICD-10-CM | POA: Insufficient documentation

## 2016-05-01 DIAGNOSIS — E785 Hyperlipidemia, unspecified: Secondary | ICD-10-CM | POA: Insufficient documentation

## 2016-05-01 DIAGNOSIS — I481 Persistent atrial fibrillation: Secondary | ICD-10-CM | POA: Insufficient documentation

## 2016-05-01 DIAGNOSIS — I4819 Other persistent atrial fibrillation: Secondary | ICD-10-CM

## 2016-05-01 DIAGNOSIS — I4892 Unspecified atrial flutter: Secondary | ICD-10-CM | POA: Insufficient documentation

## 2016-05-01 DIAGNOSIS — I428 Other cardiomyopathies: Secondary | ICD-10-CM | POA: Diagnosis not present

## 2016-05-01 DIAGNOSIS — I493 Ventricular premature depolarization: Secondary | ICD-10-CM | POA: Diagnosis not present

## 2016-05-01 NOTE — Progress Notes (Signed)
  Echocardiogram 2D Echocardiogram has been performed.  Arvil Chaco 05/01/2016, 9:45 AM

## 2016-05-12 ENCOUNTER — Telehealth: Payer: Self-pay

## 2016-05-12 NOTE — Telephone Encounter (Signed)
Called pt about ATP episode from 04/02/2016, than came through on remote. Pt does not remember anything specific from that day, pt made aware of DMV driving restrictions. Informed pt that he would receive a call if Dr. Johney Frame wanted to make any changes. Pt voiced understanding.

## 2016-05-23 ENCOUNTER — Other Ambulatory Visit: Payer: Self-pay | Admitting: Internal Medicine

## 2016-05-23 DIAGNOSIS — J329 Chronic sinusitis, unspecified: Secondary | ICD-10-CM | POA: Insufficient documentation

## 2016-05-23 DIAGNOSIS — T7840XA Allergy, unspecified, initial encounter: Secondary | ICD-10-CM | POA: Insufficient documentation

## 2016-05-29 ENCOUNTER — Ambulatory Visit (INDEPENDENT_AMBULATORY_CARE_PROVIDER_SITE_OTHER): Payer: 59 | Admitting: *Deleted

## 2016-05-29 DIAGNOSIS — I420 Dilated cardiomyopathy: Secondary | ICD-10-CM | POA: Diagnosis not present

## 2016-05-30 ENCOUNTER — Other Ambulatory Visit: Payer: Self-pay | Admitting: Family Medicine

## 2016-05-30 ENCOUNTER — Encounter: Payer: Self-pay | Admitting: Cardiology

## 2016-05-30 DIAGNOSIS — R1310 Dysphagia, unspecified: Secondary | ICD-10-CM

## 2016-05-30 LAB — CUP PACEART REMOTE DEVICE CHECK
Battery Remaining Longevity: 32 mo
Battery Voltage: 2.94 V
Brady Statistic AS VP Percent: 97.85 %
Brady Statistic RA Percent Paced: 0.96 %
HIGH POWER IMPEDANCE MEASURED VALUE: 73 Ohm
Implantable Lead Implant Date: 20160628
Implantable Lead Implant Date: 20160628
Implantable Lead Location: 753858
Implantable Lead Model: 3830
Implantable Pulse Generator Implant Date: 20160628
Lead Channel Impedance Value: 304 Ohm
Lead Channel Impedance Value: 475 Ohm
Lead Channel Pacing Threshold Amplitude: 0.625 V
Lead Channel Pacing Threshold Pulse Width: 0.4 ms
Lead Channel Sensing Intrinsic Amplitude: 2.5 mV
Lead Channel Sensing Intrinsic Amplitude: 9.625 mV
Lead Channel Setting Pacing Amplitude: 2.25 V
Lead Channel Setting Pacing Pulse Width: 0.03 ms
Lead Channel Setting Pacing Pulse Width: 0.8 ms
MDC IDC LEAD IMPLANT DT: 20160628
MDC IDC LEAD LOCATION: 753859
MDC IDC LEAD LOCATION: 753860
MDC IDC MSMT LEADCHNL LV IMPEDANCE VALUE: 247 Ohm
MDC IDC MSMT LEADCHNL LV IMPEDANCE VALUE: 475 Ohm
MDC IDC MSMT LEADCHNL LV PACING THRESHOLD AMPLITUDE: 0.875 V
MDC IDC MSMT LEADCHNL LV PACING THRESHOLD PULSEWIDTH: 0.8 ms
MDC IDC MSMT LEADCHNL RA SENSING INTR AMPL: 2.5 mV
MDC IDC MSMT LEADCHNL RV IMPEDANCE VALUE: 361 Ohm
MDC IDC MSMT LEADCHNL RV IMPEDANCE VALUE: 456 Ohm
MDC IDC MSMT LEADCHNL RV SENSING INTR AMPL: 9.625 mV
MDC IDC SESS DTM: 20180305072822
MDC IDC SET LEADCHNL RA PACING AMPLITUDE: 2 V
MDC IDC SET LEADCHNL RV PACING AMPLITUDE: 0.5 V
MDC IDC SET LEADCHNL RV SENSING SENSITIVITY: 0.3 mV
MDC IDC STAT BRADY AP VP PERCENT: 1.02 %
MDC IDC STAT BRADY AP VS PERCENT: 0 %
MDC IDC STAT BRADY AS VS PERCENT: 1.12 %
MDC IDC STAT BRADY RV PERCENT PACED: 99.01 %

## 2016-05-30 NOTE — Progress Notes (Signed)
Remote ICD transmission.   

## 2016-06-06 ENCOUNTER — Other Ambulatory Visit (HOSPITAL_COMMUNITY): Payer: Self-pay | Admitting: Internal Medicine

## 2016-06-12 ENCOUNTER — Ambulatory Visit
Admission: RE | Admit: 2016-06-12 | Discharge: 2016-06-12 | Disposition: A | Discharge status: Home / Self Care | Payer: 59 | Source: Ambulatory Visit | Attending: Family Medicine | Admitting: Family Medicine

## 2016-06-12 DIAGNOSIS — R1310 Dysphagia, unspecified: Secondary | ICD-10-CM

## 2016-06-29 ENCOUNTER — Other Ambulatory Visit (HOSPITAL_COMMUNITY): Payer: Self-pay | Admitting: Internal Medicine

## 2016-10-06 ENCOUNTER — Other Ambulatory Visit: Payer: Self-pay | Admitting: Internal Medicine

## 2016-10-06 NOTE — Telephone Encounter (Signed)
Request for Pradaxa 150mg  received, pt is 41 yrs old, Crea-0.71 on 02/11/16, Wt-116.6kg, last seen by Dr. Johney Frame on 02/28/16, CrCl-225.44ml/min. Will send in refill to requested pharmacy.

## 2016-10-10 ENCOUNTER — Other Ambulatory Visit: Payer: Self-pay | Admitting: Internal Medicine

## 2016-10-10 DIAGNOSIS — I482 Chronic atrial fibrillation, unspecified: Secondary | ICD-10-CM

## 2016-10-16 ENCOUNTER — Other Ambulatory Visit (HOSPITAL_COMMUNITY): Payer: Self-pay | Admitting: Internal Medicine

## 2016-11-24 ENCOUNTER — Ambulatory Visit (HOSPITAL_COMMUNITY)
Admission: RE | Admit: 2016-11-24 | Discharge: 2016-11-24 | Disposition: A | Discharge status: Home / Self Care | Payer: 59 | Source: Ambulatory Visit | Attending: Cardiology | Admitting: Cardiology

## 2016-11-24 ENCOUNTER — Encounter (HOSPITAL_COMMUNITY): Payer: Self-pay | Admitting: Cardiology

## 2016-11-24 VITALS — BP 127/87 | HR 79 | Wt 257.5 lb

## 2016-11-24 DIAGNOSIS — Z8241 Family history of sudden cardiac death: Secondary | ICD-10-CM | POA: Insufficient documentation

## 2016-11-24 DIAGNOSIS — I493 Ventricular premature depolarization: Secondary | ICD-10-CM | POA: Diagnosis not present

## 2016-11-24 DIAGNOSIS — I480 Paroxysmal atrial fibrillation: Secondary | ICD-10-CM | POA: Diagnosis not present

## 2016-11-24 DIAGNOSIS — R001 Bradycardia, unspecified: Secondary | ICD-10-CM | POA: Diagnosis not present

## 2016-11-24 DIAGNOSIS — I10 Essential (primary) hypertension: Secondary | ICD-10-CM | POA: Diagnosis not present

## 2016-11-24 DIAGNOSIS — I420 Dilated cardiomyopathy: Secondary | ICD-10-CM | POA: Diagnosis not present

## 2016-11-24 DIAGNOSIS — I429 Cardiomyopathy, unspecified: Secondary | ICD-10-CM | POA: Diagnosis present

## 2016-11-24 DIAGNOSIS — Z95810 Presence of automatic (implantable) cardiac defibrillator: Secondary | ICD-10-CM | POA: Insufficient documentation

## 2016-11-24 DIAGNOSIS — I4892 Unspecified atrial flutter: Secondary | ICD-10-CM | POA: Diagnosis not present

## 2016-11-24 DIAGNOSIS — M109 Gout, unspecified: Secondary | ICD-10-CM | POA: Diagnosis not present

## 2016-11-24 DIAGNOSIS — Z7902 Long term (current) use of antithrombotics/antiplatelets: Secondary | ICD-10-CM | POA: Insufficient documentation

## 2016-11-24 DIAGNOSIS — Z9889 Other specified postprocedural states: Secondary | ICD-10-CM | POA: Insufficient documentation

## 2016-11-24 DIAGNOSIS — I471 Supraventricular tachycardia: Secondary | ICD-10-CM | POA: Diagnosis not present

## 2016-11-24 DIAGNOSIS — I481 Persistent atrial fibrillation: Secondary | ICD-10-CM | POA: Diagnosis not present

## 2016-11-24 DIAGNOSIS — I472 Ventricular tachycardia: Secondary | ICD-10-CM | POA: Diagnosis not present

## 2016-11-24 DIAGNOSIS — Z7901 Long term (current) use of anticoagulants: Secondary | ICD-10-CM | POA: Insufficient documentation

## 2016-11-24 DIAGNOSIS — Z7289 Other problems related to lifestyle: Secondary | ICD-10-CM | POA: Diagnosis not present

## 2016-11-24 DIAGNOSIS — I4819 Other persistent atrial fibrillation: Secondary | ICD-10-CM

## 2016-11-24 LAB — BASIC METABOLIC PANEL
Anion gap: 9 (ref 5–15)
BUN: 11 mg/dL (ref 6–20)
CHLORIDE: 104 mmol/L (ref 101–111)
CO2: 25 mmol/L (ref 22–32)
CREATININE: 0.61 mg/dL (ref 0.61–1.24)
Calcium: 9.2 mg/dL (ref 8.9–10.3)
GFR calc non Af Amer: >60 mL/min (ref >60)
Glucose, Bld: 110 mg/dL — ABNORMAL HIGH (ref 65–99)
Potassium: 4.1 mmol/L (ref 3.5–5.1)
Sodium: 138 mmol/L (ref 135–145)

## 2016-11-24 LAB — LIPID PANEL
Cholesterol: 130 mg/dL (ref 0–200)
HDL: 34 mg/dL — AB (ref >40)
LDL Cholesterol: 64 mg/dL (ref 0–99)
TRIGLYCERIDES: 158 mg/dL — AB (ref <150)
Total CHOL/HDL Ratio: 3.8 RATIO
VLDL: 32 mg/dL (ref 0–40)

## 2016-11-24 LAB — CBC
HEMATOCRIT: 42 % (ref 39.0–52.0)
Hemoglobin: 14.9 g/dL (ref 13.0–17.0)
MCH: 31.9 pg (ref 26.0–34.0)
MCHC: 35.5 g/dL (ref 30.0–36.0)
MCV: 89.9 fL (ref 78.0–100.0)
PLATELETS: 188 10*3/uL (ref 150–400)
RBC: 4.67 MIL/uL (ref 4.22–5.81)
RDW: 12.8 % (ref 11.5–15.5)
WBC: 6.2 10*3/uL (ref 4.0–10.5)

## 2016-11-24 LAB — MAGNESIUM: MAGNESIUM: 2.2 mg/dL (ref 1.7–2.4)

## 2016-11-24 NOTE — Patient Instructions (Signed)
Labs drawn today  Your physician recommends that you schedule a follow-up appointment in: 6 months  

## 2016-11-25 NOTE — Progress Notes (Signed)
Patient ID: CABLE FEARN, male   DOB: 1975/07/08, 41 y.o.   MRN: [id number] PCP: Dr. Duaine Dredge HF Cardiology: Dr. Shirlee Latch  41 yo with history of cardiomyopathy, heavy ETOH use, paroxysmal atrial fibrillation and flutter with bradycardia, and frequent PVCs.  Patient has a long EP history.  He has paroxysmal atrial fibrillation with slow ventricular response.  He had atrial flutter ablation in 2012 and also has atrial tachycardia with focus near the AV node that has not been ablated.  He had another ablation of atypical atrial flutter in 4/17.  He had a holter in 5/15 showing a 3.8 second pause, predominant atrial fibrillation, and 39% PVCs (some of this was likely atrial fibrillation with aberrancy).  He had runs of NSVT.  He is now on dofetilide. He had a Medtronic CRT-D device placed with his bundle lead.  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  Cardiac MRI in 8/15 did not show evidence for ARVC.    He was referred to CHF clinic initially for management of his cardiomyopathy.  Echo in 7/15 suggested EF 30% but difficult to quantify due to PVCs.  Cardiac MRI in 8/15 showed EF 45% with normal RV and a nonspecific RV insertion site delayed enhancement pattern.  He stopped ETOH completely.  Last echo in 2/18 showed EF 50-55%.    He seems to be doing well symptomatically.  Not short of breath with exertion, walks for exercise. No lightheadedness/syncope. He does not feel palpitations though he continues to have occasional atrial fibrillation.  He is in atrial fibrillation today.  Weight is stable.   Optivol: Fluid index < threshold, stable impedance, last couple of days he has had atrial arrhythmias.    ECG: atrial fibrillation with his bundle pacing, QTc 508 msec  Optivol interrogation: Fluid index < threshold with stable impedance, occasional atrial fibrillation, no VT, 3-4 hours activity.   Labs (5/15): K 3.9, creatinine 0.7 Labs (8/15):  creatinine 0.9 Labs (9/15): K 4.3, creatinine 0.83, pBNP 700 Labs (8/17): K 4.2, creatinine 0.71, HCT 42.2 Labs (11/17): hgb 15.4  PMH: 1. Prior heavy ETOH use.  2. Atrial fibrillation: Paroxysmal.  CHADSVASC = 2, he is on Pradaxa.   3. HTN 4. Atrial flutter: s/p ablation 11/12.  He had ablation of atypical atrial flutter again in 4/17.  5. Atrial tachycardia: Mapped to AV node, not ablated.  6. Ventricular arrhythmias: NSVT, also very frequent PVCs. Some of beats that seem to be PVCs are likely aberrantly conducted atrial fibrillation.  Holter (5/15) with 3.8 second pause, atrial fibrillation predominantly, 39% PVCs (some of this likely is atrial fibrillation with aberrancy), runs of WCT (suspected NSVT).  7. Cardiomyopathy: Echo (7/15) with EF 30%, mild diffuse hypokinesis, mild LV dilation.  Cardiac MRI (8/15) with moderate LV dilation, EF 45%, diffuse hypokinesis, normal RV with no evidence for ARVC, delayed enhancement at the RV insertion sites which is a nonspecific pattern.  Lexiscan Cardiolite (5/15) with mild scar in the inferior and inferoseptal walls, no ischemia, not gated due to PVCs.  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   - Medtronic CRT-D with His bundle lead.  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  No tobacco.  Occasional marijuana.  Never used cocaine.  Prior heavy ETOH but has quit.   FH: Mother with SCD in her 42s and dilated cardiomyopathy.  Maternal grandmother with SCD in her 42s, great-aunt with SCD, sister  with PVCs.   ROS: All systems reviewed and negative except as per HPI.   Current Outpatient Prescriptions  Medication Sig Dispense Refill  . acyclovir (ZOVIRAX) 400 MG tablet Take 400 mg by mouth 2 (two) times daily as needed (outbreak).     [name] allopurinol (ZYLOPRIM) 300 MG tablet Take 300 mg by mouth every morning.     [name] ALPRAZolam (XANAX) 0.5 MG tablet Take 0.5 tablets by mouth daily as needed. anxiety  1  .  atorvastatin (LIPITOR) 20 MG tablet Take 20 mg by mouth every evening.     [name] b complex vitamins capsule Take 1 capsule by mouth every evening.     . carvedilol (COREG) 6.25 MG tablet TAKE 1 TABLET (6.25 MG TOTAL) BY MOUTH 2 (TWO) TIMES DAILY. 180 tablet 1  . cholecalciferol (VITAMIN D) 1000 UNITS tablet Take 1,000 Units by mouth every evening.     . dofetilide (TIKOSYN) 500 MCG capsule TAKE 1 CAPSULE (500 MCG TOTAL) BY MOUTH 2 (TWO) TIMES DAILY. 180 capsule 2  . ENTRESTO 49-51 MG TAKE 1 TABLET BY MOUTH TWICE A DAY 180 tablet 3  . fish oil-omega-3 fatty acids 1000 MG capsule Take 2 g by mouth every evening.     . indomethacin (INDOCIN) 25 MG capsule Take 25 mg by mouth 3 (three) times daily as needed. gout    . magnesium oxide (MAG-OX) 400 (241.3 Mg) MG tablet Take 1 tablet by mouth every morning.   11  . Multiple Vitamin (MULITIVITAMIN WITH MINERALS) TABS Take 1 tablet by mouth every evening.     [name] PRADAXA 150 MG CAPS capsule TAKE ONE CAPSULE BY MOUTH TWICE A DAY 60 capsule 5  . psyllium (METAMUCIL) 58.6 % packet Take 1 packet by mouth daily.    [name] spironolactone (ALDACTONE) 25 MG tablet TAKE 1/2 TABLET BY MOUTH ONCE DAILY 15 tablet 3   No current facility-administered medications for this encounter.     BP 127/87   Pulse 79   Wt 257 lb 8 oz (116.8 kg)   SpO2 99%   BMI 33.06 kg/m  General: NAD Neck: No JVD, no thyromegaly or thyroid nodule.  Lungs: Clear to auscultation bilaterally with normal respiratory effort. CV: Nondisplaced PMI.  Heart irregular S1/S2, no S3/S4, no murmur.  No peripheral edema.  No carotid bruit.  Normal pedal pulses.  Abdomen: Soft, nontender, no hepatosplenomegaly, no distention.  Skin: Intact without lesions or rashes.  Neurologic: Alert and oriented x 3.  Psych: Normal affect. Extremities: No clubbing or cyanosis.  HEENT: Normal.   Assessment/Plan:  1. Cardiomyopathy:  Most recent echo showed EF improved to 50-55%.  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  He has a Secondary school teacher CRT-D device with His bundle lead.  - Continue to avoid heavy ETOH. - Continue spironolactone, Entresto, and Coreg at current doses. - Check BMET. 2. Atrial fibrillation: Paroxysmal atrial fibrillation with bradycardic ventricular response, now has CRT device.  He is in atrial fibrillation today.  Device interrogation shows occasional atrial fibrillation.  - He is on Pradaxa with no bleeding sequelae. CBC today.  - Continue dofetilide, paced QTc < 550 msec.  BMET/Mg today.  3. Atrial flutter: s/p ablation most recently in 4/17.   4. Atrial tachycardia: Prior history, mapped to area near AV node.  5.  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    Marca Ancona 11/25/2016

## 2016-12-12 ENCOUNTER — Ambulatory Visit: Payer: Self-pay | Admitting: Surgery

## 2016-12-13 ENCOUNTER — Ambulatory Visit: Payer: Self-pay | Admitting: Surgery

## 2016-12-13 ENCOUNTER — Other Ambulatory Visit: Payer: Self-pay | Admitting: Cardiology

## 2016-12-13 NOTE — H&P (Signed)
[facility] 12/12/2016 10:21 AM Location: [facility] Patient #: [id number] DOB: [birth year] Married / Language: English / Race: White Male  History of Present Illness Donald Sportsman MD; 12/13/2016 2:16 PM) The patient is a 41 year old male who presents with anal fistula. Note for "Anal fistula": ` ` ` Patient sent for surgical consultation Chief Complaint: Anal drainage. 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  Procedure (Date: 12/19/2012):  Examination under anesthesia, excision of chronic anal sinus tract.  Pathology consistent with fistulous-like tract. No Crohn's. No cancer.  The patient is a 41 year old male that had some persistent anal drainage. I did examination under anesthesia September 2014. Had a chronic sinus tract but I cannot prove a fistula. I excised the. The wound gradually closed up over a few months. Pathology just showed fistula tract. No cancer or Crohn's or other abnormality. He returned later in 2015. I was concerned he may have recurrent fistula. I discussed Grant with him. As best I can gather, that did not happen. He's had issues with atrial fibrillation and dysrhythmias. He's had ablations. Has been on anticoagulation.  He comes again 3 years later. His cardiac issues became more complicated. He's been managed at [facility]. The drainage was not too severe, so he delayed the operation. Physically he is better. However he still has persistent anal symptoms, he is here to rediscuss reoperation. I believe the cardiologist for concern about infection risk with his pacemaker in place and wished him to try and readdress it as well  (Review of systems as stated in this history (HPI) or in the review of systems. Otherwise all other 12 point ROS are negative)    Diagnosis Anus, fistula, sinus - POLYPOID FRAGMENTS OF SLIGHTLY INFLAMED SQUAMOUS EPITHELIUM WITH UNDERLYING ACUTE AND CHRONIC INFLAMMATION CONSISTENT WITH  FISTULA. - NO DYSPLASIA OR MALIGNANCY. [facility] LI MD Pathologist, Electronic Signature (Case signed 12/20/2012) Specimen [facility] and Clinical Information Specimen(s) Obtained: Anus, fistula, sinus [facility] The specimen is received in formalin and consists of three pieces of tan-red, hemorrhagic and partially cauterized soft tissue ranging from 0.9 x 0.8 x 0.6 cm to 2.6 x 0.9 x 0.4 cm. The largest piece of soft tissue displays a 0.9 x 0.4 cm ellipse of tan-white skin, with a 0.4 cm defect. Representative sections are submitted in one cassette, to include full thickness section through skin defect. ([facility] 12/19/2012) Report signed out from the following location(s) Technical Component performed at [facility]. [address],[place] [address].CLIA:[id number],CAP:[id number]., Interpretation performed at [facility] [address], [place], [place] [address]. CLIA #: [id number],   Problem List/Past Medical Donald Sportsman, MD; 12/12/2016 10:37 AM) [facility] FISTULA ([facility])  Past Surgical History Donald Sportsman, MD; 12/12/2016 10:37 AM) RECTAL EUA [id number]) 12/08/2012 Examination under anesthesia. Excision of chronic anal sinus tract. No proof of true fistula  Diagnostic Studies History Donald Sportsman, MD; 12/12/2016 10:37 AM) Colonoscopy never  Allergies ([facility], Grant; 12/12/2016 10:24 AM) No Known Drug Allergies 02/02/2014 Allergies Reconciled  Medication History ([facility], Grant; 12/12/2016 10:24 AM) Zovirax (  Tablet, Oral as needed) Active. Allopurinol (  Tablet, Oral daily) Active. Lipitor (  Tablet, Oral daily) Active. Colchicine (0.6MG  Capsule, Oral daily) Active. Pradaxa (  Capsule, Oral daily) Active. Fish Oil + D3 (1000-1000MG -UNIT Capsule, Oral daily) Active. Multiple Vitamin (Oral daily) Active. Entresto (49-51MG  Tablet, Oral daily) Active. Aldactone (  Tablet, Oral daily)  Active. Medications Reconciled  Social History Donald Sportsman, MD; 12/12/2016 10:37 AM) Alcohol use Heavy alcohol  use. Illicit drug use Uses socially only. No caffeine use Tobacco use Former smoker.  Family History Donald Sportsman, MD; 12/12/2016 10:37 AM) Heart Disease Mother, Sister. Heart disease in male family member before age 66 Hypertension Father.  Other Problems Donald Sportsman, MD; 12/12/2016 10:37 AM) Alcohol Abuse Anxiety Disorder Atrial Fibrillation Congestive Heart Failure Gastroesophageal Reflux Disease Hypercholesterolemia     Review of Systems Donald Sportsman, MD; 12/12/2016 10:37 AM) General Not Present- Appetite Loss, Chills, Fatigue, Fever, Night Sweats, Weight Gain and Weight Loss. Skin Present- Non-Healing Wounds. Not Present- Change in Wart/Mole, Dryness, Hives, Jaundice, Donald Lesions, Rash and Ulcer. HEENT Not Present- Earache, Hearing Loss, Hoarseness, Nose Bleed, Oral Ulcers, Ringing in the Ears, Seasonal Allergies, Sinus Pain, Sore Throat, Visual Disturbances, Wears glasses/contact lenses and Yellow Eyes. Respiratory Not Present- Bloody sputum, Chronic Cough, Difficulty Breathing, Snoring and Wheezing. Cardiovascular Present- Palpitations. Not Present- Chest Pain, Difficulty Breathing Lying Down, Leg Cramps, Rapid Heart Rate, Shortness of Breath and Swelling of Extremities. Gastrointestinal Present- Change in Bowel Habits. Not Present- Abdominal Pain, Bloating, Bloody Stool, Chronic diarrhea, Constipation, Difficulty Swallowing, Excessive gas, Gets full quickly at meals, Hemorrhoids, Indigestion, Nausea, Rectal Pain and Vomiting. Male Genitourinary Not Present- Blood in Urine, Change in Urinary Stream, Frequency, Impotence, Nocturia, Painful Urination, Urgency and Urine Leakage. Musculoskeletal Not Present- Back Pain, Joint Pain, Joint Stiffness, Muscle Pain, Muscle Weakness and Swelling of Extremities.  Vitals ([facility];  12/12/2016 10:24 AM) 12/12/2016 10:23 AM Weight: 259 lb Height: 74in Body Surface Area: 2.43 m Body Mass Index: 33.25 kg/m  Temp.: 96.70F  Pulse: 73 (Regular)  P.OX: 98% (Room air) BP: 124/86 (Sitting, Left Arm, Standard)      Physical Exam Donald Sportsman MD; 12/13/2016 2:18 PM)  General Mental Status-Alert. General Appearance-Not in acute distress. Voice-Normal.  Integumentary Global Assessment Normal Exam - Distribution of scalp and body hair is normal. General Characteristics Overall Skin Surface - no rashes and no suspicious lesions.  Head and Neck Head-normocephalic, atraumatic with no lesions or palpable masses. Face Global Assessment - atraumatic, no absence of expression. Neck Global Assessment - no abnormal movements, no decreased range of motion. Trachea-midline. Thyroid Gland Characteristics - non-tender.  Eye Eyeball - Left-Extraocular movements intact, No Nystagmus. Eyeball - Right-Extraocular movements intact, No Nystagmus. Upper Eyelid - Left-No Cyanotic. Upper Eyelid - Right-No Cyanotic.  Chest and Lung Exam Inspection Accessory muscles - No use of accessory muscles in breathing.  Abdomen Note: Abdomen soft. Nontender, nondistended. No guarding. No diastasis. No umbilical nor other hernias  Male Genitourinary Note: No inguinal hernias. Normal external genitalia. Epididymi, testes, and spermatic cords normal without any masses.  Rectal Note: posterior mid fne the anal verge.g with open sinus about 15 mm from the anal verge. Early to the left. Similar to prior location. Pinhole of drainage. I can feel a cord going toward the sphincter complex. I held off on digital or anoscopic exam.f on anymore aggressive digital or anoscopic exam.  Perianal skin clean with good hygiene. No pruritis ani. No pilonidal disease. No fissure. Normal sphincter tone. No external hemorrhoids. No condyloma warts.   Peripheral  Vascular Upper Extremity Inspection - Left - Not Gangrenous, No Petechiae. Right - Not Gangrenous, No Petechiae.  Neurologic Neurologic evaluation reveals -normal attention span and ability to concentrate, able to name objects and repeat phrases. Appropriate fund of knowledge and normal coordination.  Neuropsychiatric Mental status exam performed with findings of-able to articulate well with normal speech/language, rate, volume and coherence and  no evidence of hallucinations, delusions, obsessions or homicidal/suicidal ideation. Orientation-oriented X3.  Musculoskeletal Global Assessment Gait and Station - normal gait and station.  Lymphatic General Lymphatics Description - No Generalized lymphadenopathy.    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    I recommended examination Exam under anesthesia. Superficial fistulotomy versus repair.  He will require cardiac clearance given the fact that he is on chronic anticoagulation now for his atrial fibrillation. He has good exercise tolerance. No Donald events since last seen earlier this year. Hopefully OK to hold Pradaxa a few days before Grant if Cardiology (Drs Allred/McClean) agree. Restart soon after.  The anatomy & physiology of the anorectal region was discussed. We discussed the pathophysiology of anorectal abscess and fistula. Differential diagnosis was discussed. Natural history progression was discussed. I stressed the importance of a bowel regimen to have daily soft bowel movements to minimize progression of disease.  The patient's condition is not adequately controlled. Non-operative treatment has not healed the fistula. Therefore, I recommended examination under anaesthesia to confirm the diagnosis and treat the fistula. I discussed techniques that may be required such as fistulotomy, ligation by LIFT  technique, and/or seton placement. Benefits & alternatives discussed. I noted a good likelihood this will help address the problem, but sometimes repeat operations and prolonged healing times may occur. Risks such as bleeding, pain, recurrence, reoperation, incontinence, heart attack, stroke, death, and other risks were discussed.  Educational handouts further explaining the pathology, treatment options, and bowel regimen were given. The patient expressed understanding & wishes to proceed. We will work to coordinate Grant for a mutually convenient time.  Current Plans Referred to Cardiology, for evaluation and follow up (Cardiology). Pt Education - [facility] Abscess/Fistula (AT): discussed with patient and provided information. The anatomy & physiology of the anorectal region was discussed. We discussed the pathophysiology of anorectal abscess and fistula. Differential diagnosis was discussed. Natural history progression was discussed. I stressed the importance of a bowel regimen to have daily soft bowel movements to minimize progression of disease.  The patient's condition is not adequately controlled. Non-operative treatment has not healed the fistula. Therefore, I recommended examination under anaesthesia to confirm the diagnosis and treat the fistula. I discussed techniques that may be required such as fistulotomy, ligation by LIFT technique, and/or seton placement. Benefits & alternatives discussed. I noted a good likelihood this will help address the problem, but sometimes repeat operations and prolonged healing times may occur. Risks such as bleeding, pain, recurrence, reoperation, incontinence, heart attack, death, and other risks were discussed.  Educational handouts further explaining the pathology, treatment options, and bowel regimen were given. The patient expressed understanding & wishes to proceed. We will work to coordinate Grant for a mutually convenient time.  ENCOUNTER  FOR PREOPERATIVE EXAMINATION FOR GENERAL SURGICAL PROCEDURE (Z01.818)  Current Plans You are being scheduled for Grant- Our schedulers will call you.  You should hear from our office's scheduling department within 5 working days about the location, date, and time of Grant. We try to make accommodations for patient's preferences in scheduling Grant, but sometimes the OR schedule or the surgeon's schedule prevents [place] from making those accommodations.  If you have not heard from our office [phone]) in 5 working days, call the office and ask for your surgeon's nurse.  If you have other questions about your diagnosis, plan, or Grant, call the office and ask for your surgeon's nurse.  Pt Education - [facility] Rectal Prep for Anorectal outpatient/office Grant: discussed with patient and provided information. Pt Education - [facility] Rectal Grant HCI (Linsi Humann): discussed with patient and provided information. Pt Education - [facility] Good Bowel Health (Shadai Mcclane)  [facility], M.D., F.A.C.S. [facility] [address] [place], [place] [address] [phone] Main / Paging

## 2016-12-15 ENCOUNTER — Telehealth: Payer: Self-pay | Admitting: Internal Medicine

## 2016-12-15 NOTE — Telephone Encounter (Signed)
New message    Elease Hashimoto is calling Dr. Karie Soda office is calling. She wants to talk about clearance for pt to have surgery. She said she faxed over clearance for Dr. Johney Frame. She is asking for a call back from nurse. Please call.      [facility] Pre-operative Risk Assessment    Request for surgical clearance:  1. What type of surgery is being performed? Exam under anesthia    2. When is this surgery scheduled? Not scheduled yet  3. Are there any medications that need to be held prior to surgery and how long? Pradaxa  4. Name of physician performing surgery? Dr. Michaell Cowing   5. What is your office phone and fax number? [phone]  Elyse Hsu 12/15/2016, 11:59 AM  _________________________________________________________________   (provider comments below)

## 2016-12-15 NOTE — Telephone Encounter (Signed)
Pt takes Pradaxa for afib with CHADS2VASc score of 1 (HTN), renal function is normal. Ok to hold Pradaxa 2 days prior to fistulotomy repair.  MD to address cardiac clearance since pt undergoing general anesthesia.

## 2016-12-15 NOTE — Telephone Encounter (Signed)
Discussed with Dr. Johney Frame may proceed if medically necessary without further cardiac work up.  Patient is at moderate risk.  Will forward to Dr. Shirlee Latch as he follows him for heart failure.

## 2016-12-18 NOTE — Telephone Encounter (Signed)
That would be ok.

## 2016-12-19 NOTE — Telephone Encounter (Signed)
Approval faxed to Dr. Michaell Cowing

## 2016-12-22 ENCOUNTER — Telehealth (HOSPITAL_COMMUNITY): Payer: Self-pay | Admitting: *Deleted

## 2016-12-22 ENCOUNTER — Telehealth: Payer: Self-pay | Admitting: Pharmacist

## 2016-12-22 NOTE — Telephone Encounter (Signed)
Ok to hold Pradaxa 2 days prior to procedure and resume 24 hours after (CHADS2VASc score of 1).

## 2016-12-22 NOTE — Telephone Encounter (Signed)
[facility] requesting for cardiac clearance on patient.  Dr. Shirlee Latch advises patient to hold pradaxa 3 days prior to procedure.  Clearance faxed today to [phone]. AttnEthlyn Gallery, CMA.

## 2016-12-22 NOTE — Telephone Encounter (Signed)
-----   Message from [name], MD sent at 12/22/2016  7:02 AM EDT ----- OK to proceed with surgery if medically necessary. Anticoagulation clinic to determine perioperative anticoagulation per protocol  ----- Message ----- From: Karie Soda, MD Sent: 12/13/2016   2:21 PM To: [name], MD  Patient with recurrent perirectal fistula.  We will need operative exploration and reoperation.  Has complex cardiac disease on anticoagulation and defibrillator.  Obviously would like cardiac clearance.  Looks like Marca Ancona already saw him a couple weeks ago, so maybe just a note & OK to come off Pradaxa 2 days preop, restart POD#1?  (ASA OK by me periop)

## 2017-01-11 ENCOUNTER — Other Ambulatory Visit: Payer: Self-pay | Admitting: Internal Medicine

## 2017-01-11 NOTE — Telephone Encounter (Signed)
This is a CHF pt 

## 2017-01-23 ENCOUNTER — Other Ambulatory Visit: Payer: Self-pay | Admitting: Internal Medicine

## 2017-01-23 MED ORDER — DOFETILIDE 500 MCG PO CAPS: 500.0000 ug | ORAL_CAPSULE | Freq: Two times a day (BID) | ORAL | 0 refills | Status: DC

## 2017-01-23 NOTE — Telephone Encounter (Signed)
Pt's medication was sent to pt's pharmacy as requested. Confirmation received.  °

## 2017-01-25 NOTE — Pre-Procedure Instructions (Signed)
Cardiac clearance 12/22/16 in chart

## 2017-01-25 NOTE — Patient Instructions (Addendum)
[facility]  01/25/2017   Your procedure is scheduled on: Monday, Nov. 12, 2018   Surgery time:  7:30 AM-9:00AM   Report to [facility] Main  Entrance   Take [place] [facility]  elevators to 3rd floor to  Short Stay Center at 5:30 AM.   Call this number if you have problems the morning of surgery [phone]    Remember: ONLY 1 PERSON MAY GO WITH YOU TO SHORT STAY TO GET  READY MORNING OF YOUR SURGERY.   [facility] PREP INSTRUCTIONS for Anal/Rectal Surgery:  Obtain a bottle of Milk of Magnesia at a pharmacy of your choice   DAY PRIOR TO SURGERY:  Switch to drinking liquids or pureed foods only  1:00pm   Take 2 oz (8 tablespoons) Milk of Magnesia. (Repeat if no effect in 2 hours)  Stop eating or drinking anything before your surgery per anesthesia instructions   If you have questions or problems, please call [facility] [phone] to speak to someone in the clinic department at our office     CLEAR LIQUID DIET   Foods Allowed                                                                     Foods Excluded  Coffee and tea, regular and decaf                             liquids that you cannot  Plain Jell-O in any flavor                                             see through such as: Fruit ices (not with fruit pulp)                                     milk, soups, orange juice  Iced Popsicles                                    All solid food Carbonated beverages, regular and diet                                    Cranberry, grape and apple juices Sports drinks like Gatorade Lightly seasoned clear broth or consume(fat free) Sugar, honey syrup  Sample Menu Breakfast                                Lunch                                     Supper Cranberry juice  Beef broth                            Chicken broth Jell-O                                     Grape juice                           Apple juice Coffee or tea                         Jell-O                                      Popsicle                                                Coffee or tea                        Coffee or tea    Do not eat food or drink liquids :After Midnight.     Take these medicines the morning of surgery with A SIP OF WATER: Dofetilide (Tikosyn), Carvedilol (Coreg), Allopurinol, Alprazolam (Xanax) if needed                               You may not have any metal on your body including jewelry, and piercings              Do not wear lotions, powders or perfumes, deodorant             Men may shave face and neck.   Do not bring valuables to the hospital. [facility] IS NOT             RESPONSIBLE   FOR VALUABLES.   Contacts, dentures or bridgework may not be worn into surgery.    Patients discharged the day of surgery will not be allowed to drive home.   Name and phone number of your driver:  [facility] [phone]  Special Instructions: N/A              Please read over the following fact sheets you were given: _____________________________________________________________________             [facility] - Preparing for Surgery Before surgery, you can play an important role.  Because skin is not sterile, your skin needs to be as free of germs as possible.  You can reduce the number of germs on your skin by washing with CHG (chlorahexidine gluconate) soap before surgery.  CHG is an antiseptic cleaner which kills germs and bonds with the skin to continue killing germs even after washing. Please DO NOT use if you have an allergy to CHG or antibacterial soaps.  If your skin becomes reddened/irritated stop using the CHG and inform your nurse when you arrive at Short Stay. Do not shave (including legs and underarms) for at least 48 hours prior to the first CHG shower.  You may shave your face/neck.  Please follow these instructions carefully:  1.  Shower with CHG Soap the night before surgery and the  morning of Surgery.  2.   If you choose to wash your hair, wash your hair first as usual with your  normal  shampoo.  3.  After you shampoo, rinse your hair and body thoroughly to remove the  shampoo.                             4.  Use CHG as you would any other liquid soap.  You can apply chg directly  to the skin and wash                       Gently with a scrungie or clean washcloth.  5.  Apply the CHG Soap to your body ONLY FROM THE NECK DOWN.   Do not use on face/ open                           Wound or open sores. Avoid contact with eyes, ears mouth and genitals (private parts).                       Wash face,  Genitals (private parts) with your normal soap.             6.  Wash thoroughly, paying special attention to the area where your surgery  will be performed.  7.  Thoroughly rinse your body with warm water from the neck down.  8.  DO NOT shower/wash with your normal soap after using and rinsing off  the CHG Soap.                9.  Pat yourself dry with a clean towel.            10.  Wear clean pajamas.            11.  Place clean sheets on your bed the night of your first shower and do not  sleep with pets. Day of Surgery : Do not apply any lotions/deodorants the morning of surgery.  Please wear clean clothes to the hospital/surgery center.  FAILURE TO FOLLOW THESE INSTRUCTIONS MAY RESULT IN THE CANCELLATION OF YOUR SURGERY  PATIENT SIGNATURE_________________________________  NURSE SIGNATURE__________________________________  ________________________________________________________________________

## 2017-01-25 NOTE — Pre-Procedure Instructions (Signed)
The following are in epic: Telephone encounter surgical clearance 12/15/16 and 12/22/16 EKG 11/24/16 Last office visit note 11/24/16 ECHO 05/01/16 Remote pacer device check 05/29/16 Barium swallow 06/12/16

## 2017-01-29 ENCOUNTER — Encounter (HOSPITAL_COMMUNITY)
Admission: RE | Admit: 2017-01-29 | Discharge: 2017-01-29 | Disposition: A | Discharge status: Home / Self Care | Payer: 59 | Source: Ambulatory Visit | Attending: Surgery | Admitting: Surgery

## 2017-01-29 ENCOUNTER — Encounter (HOSPITAL_COMMUNITY): Payer: Self-pay

## 2017-01-29 DIAGNOSIS — Z01818 Encounter for other preprocedural examination: Secondary | ICD-10-CM | POA: Diagnosis present

## 2017-01-29 DIAGNOSIS — K603 Anal fistula: Secondary | ICD-10-CM | POA: Diagnosis not present

## 2017-01-29 HISTORY — DX: Anxiety disorder, unspecified: F41.9

## 2017-01-29 LAB — CBC
HCT: 41.3 % (ref 39.0–52.0)
HEMOGLOBIN: 14.8 g/dL (ref 13.0–17.0)
MCH: 32 pg (ref 26.0–34.0)
MCHC: 35.8 g/dL (ref 30.0–36.0)
MCV: 89.2 fL (ref 78.0–100.0)
PLATELETS: 184 10*3/uL (ref 150–400)
RBC: 4.63 MIL/uL (ref 4.22–5.81)
RDW: 12.5 % (ref 11.5–15.5)
WBC: 6.1 10*3/uL (ref 4.0–10.5)

## 2017-01-29 LAB — BASIC METABOLIC PANEL
Anion gap: 9 (ref 5–15)
BUN: 10 mg/dL (ref 6–20)
CALCIUM: 9.1 mg/dL (ref 8.9–10.3)
CO2: 27 mmol/L (ref 22–32)
CREATININE: 0.74 mg/dL (ref 0.61–1.24)
Chloride: 102 mmol/L (ref 101–111)
GFR calc Af Amer: >60 mL/min (ref >60)
GFR calc non Af Amer: >60 mL/min (ref >60)
GLUCOSE: 110 mg/dL — AB (ref 65–99)
Potassium: 4.2 mmol/L (ref 3.5–5.1)
Sodium: 138 mmol/L (ref 135–145)

## 2017-01-29 NOTE — Pre-Procedure Instructions (Signed)
Device orders in chart 01/29/17

## 2017-01-29 NOTE — Pre-Procedure Instructions (Signed)
BMP results faxed to Dr. Michaell Cowing via epic.

## 2017-02-01 ENCOUNTER — Ambulatory Visit (INDEPENDENT_AMBULATORY_CARE_PROVIDER_SITE_OTHER): Payer: 59 | Admitting: *Deleted

## 2017-02-01 ENCOUNTER — Telehealth: Payer: Self-pay | Admitting: Cardiology

## 2017-02-01 DIAGNOSIS — I420 Dilated cardiomyopathy: Secondary | ICD-10-CM

## 2017-02-01 NOTE — Telephone Encounter (Signed)
LMOVM reminding pt to send remote transmission.   

## 2017-02-02 ENCOUNTER — Encounter: Payer: Self-pay | Admitting: Cardiology

## 2017-02-02 NOTE — Progress Notes (Signed)
Remote ICD transmission.   

## 2017-02-04 ENCOUNTER — Encounter (HOSPITAL_COMMUNITY): Payer: Self-pay | Admitting: Anesthesiology

## 2017-02-04 NOTE — Anesthesia Preprocedure Evaluation (Addendum)
Anesthesia Evaluation  Patient identified by MRN, date of birth, ID band Patient awake    Reviewed: Allergy & Precautions, NPO status , Patient's Chart, lab work & pertinent test results, reviewed documented beta blocker date and time   Airway Mallampati: II       Dental no notable dental hx. (+) Teeth Intact   Pulmonary former smoker,    Pulmonary exam normal breath sounds clear to auscultation       Cardiovascular hypertension, Pt. on home beta blockers + dysrhythmias Atrial Fibrillation + pacemaker  Rhythm:Irregular Rate:Normal     Neuro/Psych negative neurological ROS     GI/Hepatic Neg liver ROS,   Endo/Other    Renal/GU negative Renal ROS     Musculoskeletal negative musculoskeletal ROS (+)   Abdominal (+) + obese,   Peds  Hematology negative hematology ROS (+)   Anesthesia Other Findings Veronica Guerrant [facility]  CUP PACEART REMOTE DEVICE CHECK  Order# [id number]  Ordering physician: [name], MD Study date: 05/30/2016 Result Notes for CUP PACEART REMOTE DEVICE CHECK   Notes Recorded by [name], MD on 06/02/2016 at 4:04 PM EST Remote device check reviewed. Normal device function. Battery status, leads stable. Histograms reviewed and appropriate.  Routine follow-up   Conclusion   Normal remote reviewed. Optivol stable. 100% AT/AF- on Pradaxa. ROV in June.Acie Fredrickson, RN, BSN, CCDS Result Report  Shanna Strength [facility]  ECHO COMPLETE WO IMAGING ENHANCING AGENT  Order# [id number]  Reading physician: Jake Bathe, MD Ordering physician: Laurey Morale, MD Study date: 05/01/16 Result Notes for ECHOCARDIOGRAM COMPLETE   Notes Recorded by Noralee Space, RN on 05/04/2016 at 11:21 AM EST Pt aware ------  Notes Recorded by Laurey Morale, MD on 05/01/2016 at 3:39 PM EST EF 50-55%, function mildly decreased   Study Result   Result status: Final result                             *[place]*                    *[facility]*                         [address]                        [place], [place] [address]                            [phone]  ------------------------------------------------------------------- Transthoracic Echocardiography  Patient:    Kardell, Virgil MR #:       [id number] Study Date: 05/01/2016 Gender:     M Age:        40 Height:     188 cm Weight:     116.6 kg BSA:        2.5 m^2 Pt. Status: Room:   REFERRING    [name]  ATTENDING    Marca Ancona, M.D.  ORDERING     Marca Ancona, M.D.  REFERRING    Marca Ancona, M.D.  SONOGRAPHER  Arvil Chaco  PERFORMING   [facility] Outpatient  cc:  ------------------------------------------------------------------- LV EF: 50% -   55%  ------------------------------------------------------------------- Indications:      Atrial fibrillation - 427.31.  ------------------------------------------------------------------- History:   PMH:  PVCs. 1st degree AV block. NICM. Hyperlipidemia.  Atrial tachycardia. Atrial flutter. Atrial fibrillation.  ------------------------------------------------------------------- Study Conclusions  - Left ventricle: The cavity size was normal. Wall thickness was   increased in a pattern of mild LVH. Systolic function was normal.   The estimated ejection fraction was in the range of 50% to 55%.   Wall motion was normal; there were no regional wall motion   abnormalities. - Mitral valve: There was trivial regurgitation. - Left atrium: The atrium was mildly dilated. - Right ventricle: Pacer wire or catheter noted in right ventricle. - Tricuspid valve: There was mild regurgitation.  Impressions:  - Compared to the prior study, there has been no significant   interval change.  ------------------------------------------------------------------- Study data:  Comparison was made to the study of 05/07/2015.  Study status:  Routine.   Procedure:  The patient reported no pain pre or post test. Transthoracic echocardiography. Image quality was adequate.  Study completion:  There were no complications. Transthoracic echocardiography.  M-mode, complete 2D, spectral Doppler, and color Doppler.  Birthdate:  Patient birthdate: [birth year].  Age:  Patient is 41 yr old.  Sex:  Gender: male. BMI: 33 kg/m^2.  Blood pressure:     132/93  Patient status: Outpatient.  Study date:  Study date: 05/01/2016. Study time: 09:11 AM.  Location:  Echo laboratory.  -------------------------------------------------------------------  ------------------------------------------------------------------- Left ventricle:  The cavity size was normal. Wall thickness was increased in a pattern of mild LVH. Systolic function was normal. The estimated ejection fraction was in the range of 50% to 55%. Wall motion was normal; there were no regional wall motion abnormalities.  ------------------------------------------------------------------- Aortic valve:   Trileaflet; normal thickness leaflets. Mobility was not restricted.  Doppler:  Transvalvular velocity was within the normal range. There was no stenosis. There was no regurgitation.   ------------------------------------------------------------------- Aorta:  Aortic root: The aortic root was normal in size.  ------------------------------------------------------------------- Mitral valve:   Structurally normal valve.   Mobility was not restricted.  Doppler:  Transvalvular velocity was within the normal range. There was no evidence for stenosis. There was trivial regurgitation.  ------------------------------------------------------------------- Left atrium:  The atrium was mildly dilated.  ------------------------------------------------------------------- Right ventricle:  The cavity size was normal. Wall thickness was normal. Pacer wire or catheter noted in right ventricle.  Systolic function was normal.  ------------------------------------------------------------------- Pulmonic valve:    Structurally normal valve.   Cusp separation was normal.  Doppler:  Transvalvular velocity was within the normal range. There was no evidence for stenosis. There was trivial regurgitation.  ------------------------------------------------------------------- Tricuspid valve:   Structurally normal valve.    Doppler: Transvalvular velocity was within the normal range. There was mild regurgitation.  ------------------------------------------------------------------- Pulmonary artery:   The main pulmonary artery was normal-sized. Systolic pressure was within the normal range.  ------------------------------------------------------------------- Right atrium:  The atrium was normal in size.  ------------------------------------------------------------------- Pericardium:  There was no pericardial effusion.  ------------------------------------------------------------------- Systemic veins: Inferior vena cava: The vessel was normal in size.  ------------------------------------------------------------------- Measurements   Left ventricle                         Value        Reference  LV ID, ED, PLAX chordal                49.3  mm     43 - 52  LV ID, ES, PLAX chordal                35    mm  23 - 38  LV fx shortening, PLAX chordal         29    %      >=29  LV PW thickness, ED                    12.4  mm     ----------  IVS/LV PW ratio, ED                    1.03         <=1.3  Stroke volume, 2D                      61    ml     ----------  Stroke volume/bsa, 2D                  24    ml/m^2 ----------    Ventricular septum                     Value        Reference  IVS thickness, ED                      12.8  mm     ----------    LVOT                                   Value        Reference  LVOT ID, S                             23    mm      ----------  LVOT area                              4.15  cm^2   ----------  LVOT peak velocity, S                  73.2  cm/s   ----------  LVOT mean velocity, S                  46.9  cm/s   ----------  LVOT VTI, S                            14.8  cm     ----------  LVOT peak gradient, S                  2     mm Hg  ----------    Aorta                                  Value        Reference  Aortic root ID, ED                     32    mm     ----------    Left atrium                            Value        Reference  LA ID, A-P, ES                         45    mm     ----------  LA ID/bsa, A-P                         1.8   cm/m^2 <=2.2  LA volume, S                           53.8  ml     ----------  LA volume/bsa, S                       21.5  ml/m^2 ----------  LA volume, ES, 1-p A4C                 57    ml     ----------  LA volume/bsa, ES, 1-p A4C             22.8  ml/m^2 ----------  LA volume, ES, 1-p A2C                 49.8  ml     ----------  LA volume/bsa, ES, 1-p A2C             19.9  ml/m^2 ----------    Pulmonary arteries                     Value        Reference  PA pressure, S, DP                     28    mm Hg  <=30    Tricuspid valve                        Value        Reference  Tricuspid regurg peak velocity         248   cm/s   ----------  Tricuspid peak RV-RA gradient          25    mm Hg  ----------    Right atrium                           Value        Reference  RA ID, S-I, ES, A4C            (H)     56.9  mm     34 - 49  RA area, ES, A4C               (H)     21    cm^2   8.3 - 19.5  RA volume, ES, A/L                     58.9  ml     ----------  RA volume/bsa, ES, A/L                 23.6  ml/m^2 ----------    Systemic veins                         Value        Reference  Estimated CVP  3     mm Hg  ----------    Right ventricle                        Value        Reference  TAPSE                                  23.5  mm      ----------  RV pressure, S, DP                     28    mm Hg  <=30  RV s&', lateral, S                      11.4  cm/s   ----------  Legend: (L)  and  (H)  mark values outside specified reference range.  ------------------------------------------------------------------- Prepared and Electronically Authenticated by  Donato Schultz, M.D. 2018-02-05T11:20:42 MERGE Images   Show images for ECHOCARDIOGRAM COMPLETE Patient Information   Patient Name Donald Grant, Donald Grant Sex Male DOB 1975/08/09 SSN [id number] Reason For Exam  Priority: Routine  Dx: Persistent atrial fibrillation (HCC) (I48.1 (ICD-10-CM)); PVC's (premature ventricular contractions) (I49.3 (ICD-10-CM)) Surgical History   Surgical History    No past medical history on file.  Other Surgical History    Procedure Laterality Date Comment Source ABLATION OF DYSRHYTHMIC FOCUS  07/13/15 Atypical atrial flutter ablation at [facility] by Dr [name] Provider ATRIAL ABLATION SURGERY  02/24/11 CTI ablation by Dr [name] Provider ATRIAL FIBRILLATION ABLATION  04/24/14, 08/13/14 PVI at [facility] by Dr [name], PVI with posterior wall Box and FIRM ablation performed Provider ATRIAL FLUTTER ABLATION N/A 02/24/2011 Procedure: ATRIAL FLUTTER ABLATION; Surgeon: [name], MD; Location: [facility] CATH LAB; Service: Cardiovascular; Laterality: N/A; Provider CARDIAC DEFIBRILLATOR PLACEMENT    Provider CARDIOVERSION  03/29/2011 Procedure: CARDIOVERSION; Surgeon: [name], MD; Location: [facility] OR; Service: Cardiovascular; Laterality: N/A; Provider CARDIOVERSION N/A 02/25/2015 Procedure: CARDIOVERSION; Surgeon: [name], MD; Location: [facility] ENDOSCOPY; Service: Cardiovascular; Laterality: N/A; Provider CARDIOVERSION (BEDSIDE)  03/29/2011  Provider ELECTROPHYSIOLOGIC STUDY N/A 06/21/2015 Procedure: Cardioversion; Surgeon: [name], MD; Location: [facility] INVASIVE CV LAB; Service: Cardiovascular; Laterality: N/A; Provider EXAMINATION UNDER  ANESTHESIA  12/19/12 Anal Fistula Provider MOUTH SURGERY    Provider PACEMAKER IMPLANT    Provider ROOT CANAL    Provider  Performing Technologist/Nurse   Performing Technologist/Nurse: Shona Simpson          Implants     Implant Merm [id number] Coletta Memos Crt-D [id number] - Implanted     Model/Cat number: [id number] VIVA XT CRT-D Serial number: [id number] Manufacturer: [employer]   As of 05/29/2016     Status: Implanted    Merm [id number] Secure Select [id number] - Implanted     Model/Cat number: 3830 SECURE SELECT Serial number: [id number] Manufacturer: [employer]   As of 01/20/2015     Status: Implanted    Merm [id number] Audria Nine Surescan [id number] - Implanted     Model/Cat number: 5076 CAPSUREFIX NOVUS MRI SURESCAN Serial number: [id number] Manufacturer: [employer]   As of 01/20/2015     Status: Implanted    Merm 6968m Sprint Quattro Secure S [id number] - Implanted     Model/Cat number: 4166A SPRINT Alvina Chou S Serial number: [id number] Manufacturer: [employer]   As of 01/20/2015     Status:  Implanted    Merm [id number] Selectsecure [id number] - Implanted     Model/Cat number: 3830 SELECTSECURE Serial number: [id number] Manufacturer: [employer]   As of 02/28/2016     Status: Implanted    Merm 6980m Sprint Quattro Secure [place] Surescan [id number] v - Implanted     Model/Cat number: 6295M SPRINT Ernie Hew MRI [facility] Serial number: [id number] Manufacturer: [employer]   As of 02/28/2016     Status: Implanted    Order-Level Documents:   There are no order-level documents.  Encounter-Level Documents - 05/01/2016:   Electronic signature on 05/01/2016 8:51 AM    Signed   Electronically signed by Jake Bathe, MD on 05/01/16 at 1120 EST Printable Result Report    Result Report  External Result Report    External Result  Report     Reproductive/Obstetrics                           Anesthesia Physical Anesthesia Plan  ASA: III  Anesthesia Plan: General   Post-op Pain Management:    Induction: Intravenous  PONV Risk Score and Plan: 3 and Ondansetron, Dexamethasone, Midazolam and Scopolamine patch - Pre-op  Airway Management Planned: Oral ETT  Additional Equipment:   Intra-op Plan:   Post-operative Plan: Extubation in OR  Informed Consent: I have reviewed the patients History and Physical, chart, labs and discussed the procedure including the risks, benefits and alternatives for the proposed anesthesia with the patient or authorized representative who has indicated his/her understanding and acceptance.   Dental advisory given  Plan Discussed with: CRNA and Surgeon  Anesthesia Plan Comments:        Anesthesia Quick Evaluation

## 2017-02-05 ENCOUNTER — Encounter (HOSPITAL_COMMUNITY)
Admission: RE | Disposition: A | Discharge status: Home / Self Care | Payer: Self-pay | Source: Ambulatory Visit | Attending: Surgery

## 2017-02-05 ENCOUNTER — Ambulatory Visit (HOSPITAL_COMMUNITY)
Admission: RE | Admit: 2017-02-05 | Discharge: 2017-02-05 | Disposition: A | Discharge status: Home / Self Care | Payer: 59 | Source: Ambulatory Visit | Attending: Surgery | Admitting: Surgery

## 2017-02-05 ENCOUNTER — Encounter (HOSPITAL_COMMUNITY): Payer: Self-pay | Admitting: Emergency Medicine

## 2017-02-05 ENCOUNTER — Ambulatory Visit (HOSPITAL_COMMUNITY): Payer: 59 | Admitting: Anesthesiology

## 2017-02-05 ENCOUNTER — Other Ambulatory Visit: Payer: Self-pay

## 2017-02-05 DIAGNOSIS — E785 Hyperlipidemia, unspecified: Secondary | ICD-10-CM | POA: Diagnosis not present

## 2017-02-05 DIAGNOSIS — E7800 Pure hypercholesterolemia, unspecified: Secondary | ICD-10-CM | POA: Diagnosis not present

## 2017-02-05 DIAGNOSIS — I10 Essential (primary) hypertension: Secondary | ICD-10-CM | POA: Diagnosis not present

## 2017-02-05 DIAGNOSIS — K605 Anorectal fistula: Secondary | ICD-10-CM | POA: Diagnosis present

## 2017-02-05 DIAGNOSIS — F419 Anxiety disorder, unspecified: Secondary | ICD-10-CM | POA: Insufficient documentation

## 2017-02-05 DIAGNOSIS — K219 Gastro-esophageal reflux disease without esophagitis: Secondary | ICD-10-CM | POA: Insufficient documentation

## 2017-02-05 DIAGNOSIS — Z87891 Personal history of nicotine dependence: Secondary | ICD-10-CM | POA: Diagnosis not present

## 2017-02-05 DIAGNOSIS — K603 Anal fistula, unspecified: Secondary | ICD-10-CM

## 2017-02-05 DIAGNOSIS — I4891 Unspecified atrial fibrillation: Secondary | ICD-10-CM | POA: Diagnosis not present

## 2017-02-05 DIAGNOSIS — Z7901 Long term (current) use of anticoagulants: Secondary | ICD-10-CM | POA: Diagnosis not present

## 2017-02-05 DIAGNOSIS — Z79899 Other long term (current) drug therapy: Secondary | ICD-10-CM | POA: Insufficient documentation

## 2017-02-05 DIAGNOSIS — K604 Rectal fistula: Secondary | ICD-10-CM | POA: Insufficient documentation

## 2017-02-05 HISTORY — PX: FISTULOTOMY: SHX6413

## 2017-02-05 HISTORY — PX: EVALUATION UNDER ANESTHESIA WITH HEMORRHOIDECTOMY: SHX5624

## 2017-02-05 LAB — APTT: aPTT: 21 seconds — ABNORMAL LOW (ref 24–36)

## 2017-02-05 SURGERY — FISTULOTOMY
Anesthesia: General | Site: Rectum

## 2017-02-05 MED ORDER — ACETAMINOPHEN 325 MG PO TABS: 325.0000 mg | ORAL_TABLET | ORAL | Status: DC | PRN

## 2017-02-05 MED ORDER — ONDANSETRON HCL 4 MG/2ML IJ SOLN: 4.0000 mg | Freq: Once | INTRAMUSCULAR | Status: DC | PRN

## 2017-02-05 MED ORDER — CHLORHEXIDINE GLUCONATE CLOTH 2 % EX PADS: 6.0000 | MEDICATED_PAD | Freq: Once | CUTANEOUS | Status: DC

## 2017-02-05 MED ORDER — WITCH HAZEL-GLYCERIN EX PADS
1.0000 "application " | MEDICATED_PAD | CUTANEOUS | Status: DC | PRN
  Filled 2017-02-05: qty 100

## 2017-02-05 MED ORDER — ONDANSETRON HCL 4 MG/2ML IJ SOLN
INTRAMUSCULAR | Status: AC
  Filled 2017-02-05: qty 2

## 2017-02-05 MED ORDER — ONDANSETRON HCL 4 MG/2ML IJ SOLN
INTRAMUSCULAR | Status: DC | PRN
  Administered 2017-02-05: 4 mg via INTRAVENOUS

## 2017-02-05 MED ORDER — OXYCODONE HCL 5 MG PO TABS: 5.0000 mg | ORAL_TABLET | ORAL | 0 refills | Status: DC | PRN

## 2017-02-05 MED ORDER — FENTANYL CITRATE (PF) 250 MCG/5ML IJ SOLN
INTRAMUSCULAR | Status: AC
  Filled 2017-02-05: qty 5

## 2017-02-05 MED ORDER — ROCURONIUM BROMIDE 50 MG/5ML IV SOSY
PREFILLED_SYRINGE | INTRAVENOUS | Status: AC
  Filled 2017-02-05: qty 5

## 2017-02-05 MED ORDER — ACETAMINOPHEN 160 MG/5ML PO SOLN: 325.0000 mg | ORAL | Status: DC | PRN

## 2017-02-05 MED ORDER — MIDAZOLAM HCL 2 MG/2ML IJ SOLN
INTRAMUSCULAR | Status: AC
  Filled 2017-02-05: qty 2

## 2017-02-05 MED ORDER — DABIGATRAN ETEXILATE MESYLATE 150 MG PO CAPS: 150.0000 mg | ORAL_CAPSULE | Freq: Two times a day (BID) | ORAL | 5 refills | Status: DC

## 2017-02-05 MED ORDER — BUPIVACAINE-EPINEPHRINE (PF) 0.25% -1:200000 IJ SOLN
INTRAMUSCULAR | Status: AC
  Filled 2017-02-05: qty 30

## 2017-02-05 MED ORDER — CELECOXIB 200 MG PO CAPS
200.0000 mg | ORAL_CAPSULE | ORAL | Status: AC
  Administered 2017-02-05: 200 mg via ORAL
  Filled 2017-02-05: qty 1

## 2017-02-05 MED ORDER — DEXAMETHASONE SODIUM PHOSPHATE 10 MG/ML IJ SOLN
INTRAMUSCULAR | Status: AC
  Filled 2017-02-05: qty 1

## 2017-02-05 MED ORDER — METHYLENE BLUE 0.5 % INJ SOLN
INTRAVENOUS | Status: AC
  Filled 2017-02-05: qty 10

## 2017-02-05 MED ORDER — DIBUCAINE 1 % RE OINT
TOPICAL_OINTMENT | RECTAL | Status: AC
  Filled 2017-02-05: qty 28

## 2017-02-05 MED ORDER — BUPIVACAINE-EPINEPHRINE 0.25% -1:200000 IJ SOLN
INTRAMUSCULAR | Status: DC | PRN
  Administered 2017-02-05: 20 mL

## 2017-02-05 MED ORDER — FENTANYL CITRATE (PF) 100 MCG/2ML IJ SOLN: 25.0000 ug | INTRAMUSCULAR | Status: DC | PRN

## 2017-02-05 MED ORDER — KETAMINE HCL-SODIUM CHLORIDE 100-0.9 MG/10ML-% IV SOSY
PREFILLED_SYRINGE | INTRAVENOUS | Status: AC
  Filled 2017-02-05: qty 10

## 2017-02-05 MED ORDER — OXYCODONE HCL 5 MG PO TABS: 5.0000 mg | ORAL_TABLET | Freq: Once | ORAL | Status: DC | PRN

## 2017-02-05 MED ORDER — ROCURONIUM BROMIDE 100 MG/10ML IV SOLN
INTRAVENOUS | Status: DC | PRN
  Administered 2017-02-05: 50 mg via INTRAVENOUS
  Administered 2017-02-05: 10 mg via INTRAVENOUS

## 2017-02-05 MED ORDER — 0.9 % SODIUM CHLORIDE (POUR BTL) OPTIME
TOPICAL | Status: DC | PRN
  Administered 2017-02-05: 1000 mL

## 2017-02-05 MED ORDER — BUPIVACAINE-EPINEPHRINE 0.25% -1:200000 IJ SOLN
INTRAMUSCULAR | Status: AC
  Filled 2017-02-05: qty 1

## 2017-02-05 MED ORDER — SUGAMMADEX SODIUM 500 MG/5ML IV SOLN
INTRAVENOUS | Status: DC | PRN
  Administered 2017-02-05: 250 mg via INTRAVENOUS

## 2017-02-05 MED ORDER — MIDAZOLAM HCL 5 MG/5ML IJ SOLN
INTRAMUSCULAR | Status: DC | PRN
  Administered 2017-02-05: 2 mg via INTRAVENOUS

## 2017-02-05 MED ORDER — PROPOFOL 10 MG/ML IV BOLUS
INTRAVENOUS | Status: DC | PRN
  Administered 2017-02-05: 200 mg via INTRAVENOUS

## 2017-02-05 MED ORDER — ACETAMINOPHEN 500 MG PO TABS
1000.0000 mg | ORAL_TABLET | ORAL | Status: AC
  Administered 2017-02-05: 1000 mg via ORAL
  Filled 2017-02-05: qty 2

## 2017-02-05 MED ORDER — CEFAZOLIN SODIUM-DEXTROSE 2-4 GM/100ML-% IV SOLN
INTRAVENOUS | Status: AC
  Filled 2017-02-05: qty 100

## 2017-02-05 MED ORDER — AMBULATORY NON FORMULARY MEDICATION: 1.0000 "application " | Freq: Four times a day (QID) | 2 refills | Status: DC

## 2017-02-05 MED ORDER — LIDOCAINE HCL 2 % IJ SOLN
INTRAMUSCULAR | Status: AC
  Filled 2017-02-05: qty 20

## 2017-02-05 MED ORDER — CEFAZOLIN SODIUM-DEXTROSE 2-4 GM/100ML-% IV SOLN
2.0000 g | INTRAVENOUS | Status: AC
  Administered 2017-02-05: 2 g via INTRAVENOUS

## 2017-02-05 MED ORDER — MEPERIDINE HCL 50 MG/ML IJ SOLN: 6.2500 mg | INTRAMUSCULAR | Status: DC | PRN

## 2017-02-05 MED ORDER — BUPIVACAINE LIPOSOME 1.3 % IJ SUSP
20.0000 mL | INTRAMUSCULAR | Status: DC
  Filled 2017-02-05: qty 20

## 2017-02-05 MED ORDER — OXYCODONE HCL 5 MG/5ML PO SOLN
5.0000 mg | Freq: Once | ORAL | Status: DC | PRN
  Filled 2017-02-05: qty 5

## 2017-02-05 MED ORDER — LIDOCAINE 2% (20 MG/ML) 5 ML SYRINGE
INTRAMUSCULAR | Status: DC | PRN
  Administered 2017-02-05: 1.5 mg/kg/h via INTRAVENOUS

## 2017-02-05 MED ORDER — DEXAMETHASONE SODIUM PHOSPHATE 10 MG/ML IJ SOLN
INTRAMUSCULAR | Status: DC | PRN
  Administered 2017-02-05: 10 mg via INTRAVENOUS

## 2017-02-05 MED ORDER — KETAMINE HCL 10 MG/ML IJ SOLN
INTRAMUSCULAR | Status: DC | PRN
  Administered 2017-02-05: 40 mg via INTRAVENOUS

## 2017-02-05 MED ORDER — KETOROLAC TROMETHAMINE 30 MG/ML IJ SOLN: 30.0000 mg | Freq: Once | INTRAMUSCULAR | Status: DC | PRN

## 2017-02-05 MED ORDER — SUGAMMADEX SODIUM 500 MG/5ML IV SOLN
INTRAVENOUS | Status: AC
  Filled 2017-02-05: qty 5

## 2017-02-05 MED ORDER — HYDROCORTISONE ACE-PRAMOXINE 2.5-1 % RE CREA
1.0000 "application " | TOPICAL_CREAM | Freq: Four times a day (QID) | RECTAL | Status: DC | PRN
  Filled 2017-02-05: qty 30

## 2017-02-05 MED ORDER — BUPIVACAINE LIPOSOME 1.3 % IJ SUSP
INTRAMUSCULAR | Status: DC | PRN
  Administered 2017-02-05: 20 mL

## 2017-02-05 MED ORDER — LACTATED RINGERS IV SOLN
INTRAVENOUS | Status: DC | PRN
  Administered 2017-02-05: 07:00:00 via INTRAVENOUS

## 2017-02-05 MED ORDER — GABAPENTIN 300 MG PO CAPS
300.0000 mg | ORAL_CAPSULE | ORAL | Status: AC
  Administered 2017-02-05: 300 mg via ORAL
  Filled 2017-02-05: qty 1

## 2017-02-05 MED ORDER — PROPOFOL 10 MG/ML IV BOLUS
INTRAVENOUS | Status: AC
  Filled 2017-02-05: qty 20

## 2017-02-05 MED ORDER — METRONIDAZOLE IN NACL 5-0.79 MG/ML-% IV SOLN
500.0000 mg | INTRAVENOUS | Status: AC
  Administered 2017-02-05: 500 mg via INTRAVENOUS
  Filled 2017-02-05: qty 100

## 2017-02-05 MED ORDER — FENTANYL CITRATE (PF) 100 MCG/2ML IJ SOLN
INTRAMUSCULAR | Status: DC | PRN
  Administered 2017-02-05 (×2): 50 ug via INTRAVENOUS
  Administered 2017-02-05: 100 ug via INTRAVENOUS

## 2017-02-05 SURGICAL SUPPLY — 35 items
BENZOIN TINCTURE PRP APPL 2/3 (GAUZE/BANDAGES/DRESSINGS) ×3 IMPLANT
BLADE SURG 15 STRL LF DISP TIS (BLADE) ×1 IMPLANT
BLADE SURG 15 STRL SS (BLADE) ×2
BRIEF STRETCH FOR OB PAD LRG (UNDERPADS AND DIAPERS) ×3 IMPLANT
COVER SURGICAL LIGHT HANDLE (MISCELLANEOUS) ×3 IMPLANT
DECANTER SPIKE VIAL GLASS SM (MISCELLANEOUS) ×3 IMPLANT
DRAPE LAPAROTOMY T 102X78X121 (DRAPES) ×3 IMPLANT
DRSG PAD ABDOMINAL 8X10 ST (GAUZE/BANDAGES/DRESSINGS) IMPLANT
ELECT PENCIL ROCKER SW 15FT (MISCELLANEOUS) ×3 IMPLANT
ELECT REM PT RETURN 15FT ADLT (MISCELLANEOUS) ×3 IMPLANT
GAUZE SPONGE 4X4 12PLY STRL (GAUZE/BANDAGES/DRESSINGS) ×3 IMPLANT
GAUZE SPONGE 4X4 16PLY XRAY LF (GAUZE/BANDAGES/DRESSINGS) ×3 IMPLANT
GLOVE ECLIPSE 8.0 STRL XLNG CF (GLOVE) ×3 IMPLANT
GLOVE INDICATOR 8.0 STRL GRN (GLOVE) ×3 IMPLANT
GOWN STRL REUS W/TWL XL LVL3 (GOWN DISPOSABLE) ×6 IMPLANT
KIT BASIN OR (CUSTOM PROCEDURE TRAY) ×3 IMPLANT
LUBRICANT JELLY K Y 4OZ (MISCELLANEOUS) ×3 IMPLANT
NDL SAFETY ECLIPSE 18X1.5 (NEEDLE) ×1 IMPLANT
NEEDLE HYPO 18GX1.5 SHARP (NEEDLE) ×2
NEEDLE HYPO 22GX1.5 SAFETY (NEEDLE) ×3 IMPLANT
NEEDLE SPNL 22GX7 QUINCKE BK (NEEDLE) ×3 IMPLANT
PACK BASIC VI WITH GOWN DISP (CUSTOM PROCEDURE TRAY) ×3 IMPLANT
PAD ABD 8X10 STRL (GAUZE/BANDAGES/DRESSINGS) ×3 IMPLANT
SCRUB TECHNI CARE 4 OZ NO DYE (MISCELLANEOUS) ×3 IMPLANT
SHEARS HARMONIC 9CM CVD (BLADE) IMPLANT
SUT CHROMIC 2 0 SH (SUTURE) ×3 IMPLANT
SUT CHROMIC 3 0 SH 27 (SUTURE) IMPLANT
SUT VIC AB 2-0 SH 27 (SUTURE)
SUT VIC AB 2-0 SH 27X BRD (SUTURE) IMPLANT
SUT VIC AB 2-0 UR6 27 (SUTURE) ×9 IMPLANT
SYR 10ML LL (SYRINGE) ×6 IMPLANT
SYR 20CC LL (SYRINGE) ×3 IMPLANT
TOWEL OR 17X26 10 PK STRL BLUE (TOWEL DISPOSABLE) ×3 IMPLANT
TOWEL OR NON WOVEN STRL DISP B (DISPOSABLE) ×3 IMPLANT
YANKAUER SUCT BULB TIP 10FT TU (MISCELLANEOUS) ×3 IMPLANT

## 2017-02-05 NOTE — Discharge Instructions (Signed)
ANORECTAL SURGERY:  POST OPERATIVE INSTRUCTIONS  ######################################################################  EAT Start with a pureed / full liquid diet After 24 hours, gradually transition to a high fiber diet.    CONTROL PAIN Control pain so you can tolerate bowel movements,  walk, sleep, tolerate sneezing/coughing, and go up/down stairs.   HAVE A BOWEL MOVEMENT DAILY Keep your bowels regular to avoid problems.   Taking a fiber supplement every day help.   Try a laxative to override constipation. Use an antidairrheal to slow down diarrhea.   Call if not better after 2 tries  WALK Walk an hour a day.  Control your pain to do that.   CALL IF YOU HAVE PROBLEMS/CONCERNS Call if you are still struggling despite following these instructions. Call if you have concerns not answered by these instructions  ######################################################################    1. Take your usually prescribed home medications unless otherwise directed. 2. DIET: Follow a light bland diet the first 24 hours after arrival home, such as soup, liquids, crackers, etc.  Be sure to include lots of fluids daily.  Avoid fast food or heavy meals as your are more likely to get nauseated.  Eat a low fat the next few days after surgery.   3. PAIN CONTROL: a. Pain is best controlled by a usual combination of three different methods TOGETHER: i. Ice/Heat ii. Over the counter pain medication iii. Prescription pain medication b. Expect swelling and discomfort in the anus/rectal area.  Warm water baths (30-60 minutes up to 6 times a day, especially after bowel meovements) will help. Use ice for the first few days to help decrease swelling and bruising, then switch to heat such as warm towels, sitz baths, warm baths, etc to help relax tight/sore spots and speed recovery.  Some people prefer to use ice alone, heat alone, alternating between ice & heat.  Experiment to what works for you.    c. It is helpful to take an over-the-counter pain medication regularly for the first few weeks.  Choose one of the following that works best for you: i. Naproxen (Aleve, etc)  Two 244m tabs twice a day ii. Ibuprofen (Advil, etc) Three 2023mtabs four times a day (every meal & bedtime) iii. Acetaminophen (Tylenol, etc) 500-65031mour times a day (every meal & bedtime) d. A  prescription for pain medication (such as oxycodone, hydrocodone, etc) should be given to you upon discharge.  Take your pain medication as prescribed.  i. If you are having problems/concerns with the prescription medicine (does not control pain, nausea, vomiting, rash, itching, etc), please call us [phone] see if we need to switch you to a different pain medicine that will work better for you and/or control your side effect better. ii. If you need a refill on your pain medication, please contact your pharmacy.  They will contact our office to request authorization. Prescriptions will not be filled after 5 pm or on week-ends.  Use a Sitz Bath 4-8 times a day for relief   [employer] bath is a warm water bath taken in the sitting position that covers only the hips and buttocks. It may be used for either healing or hygiene purposes. Sitz baths are also used to relieve pain, itching, or muscle spasms. The water may contain medicine. Moist heat will help you heal and relax.  HOME CARE INSTRUCTIONS  Take 3 to 4 sitz baths a day. 1. Fill the bathtub half full with warm water. 2. Sit in the water and open  the drain a little. 3. Turn on the warm water to keep the tub half full. Keep the water running constantly. 4. Soak in the water for 15 to 20 minutes. 5. After the sitz bath, pat the affected area dry first.   4. KEEP YOUR BOWELS REGULAR a. The goal is one bowel movement a day b. Avoid getting constipated.  Between the surgery and the pain medications, it is common to experience some constipation.  Increasing  fluid intake and taking a fiber supplement (such as Metamucil, Citrucel, FiberCon, MiraLax, etc) 2-3 times a day regularly will usually help prevent this problem from occurring.  A mild laxative (prune juice, Milk of Magnesia, MiraLax, etc) should be taken according to package directions if there are no bowel movements after 48 hours. c. Watch out for diarrhea.  If you have many loose bowel movements, simplify your diet to bland foods & liquids for a few days.  Stop any stool softeners and decrease your fiber supplement.  Switching to mild anti-diarrheal medications (Kayopectate, Pepto Bismol) can help.  If this worsens or does not improve, please call us.  5. Wound Care  a. Remove your bandages with your first bowel movement, usually the day after surgery.  You may have packing if you had an abscess.  Let any packing come out.   b. Wear an absorbent pad or soft cotton balls in your underwear as needed to catch any drainage and help keep the area  c. Keep the area clean and dry.  Bathe / shower every day.  Keep the area clean by showering / bathing over the incision / wound.   It is okay to soak an open wound to help wash it.  Wet wipes or showers / gentle washing after bowel movements is often less traumatic than regular toilet paper. d. Dennis Bast will often notice bleeding with bowel movements.  This should slow down by the end of the first week of surgery.  Sitting on an ice pack can help. e. Expect some drainage.  This should slow down by the end of the first week of surgery, but you will have occasional bleeding or drainage up to a few months after surgery.  Wear an absorbent pad or soft cotton gauze in your underwear until the drainage stops.  6. ACTIVITIES as tolerated:   a. You may resume regular (light) daily activities beginning the next day--such as daily self-care, walking, climbing stairs--gradually increasing activities as tolerated.  If you can walk 30 minutes without difficulty, it is safe to  try more intense activity such as jogging, treadmill, bicycling, low-impact aerobics, swimming, etc. b. Save the most intensive and strenuous activity for last such as sit-ups, heavy lifting, contact sports, etc  Refrain from any heavy lifting or straining until you are off narcotics for pain control.   c. DO NOT PUSH THROUGH PAIN.  Let pain be your guide: If it hurts to do something, don't do it.  Pain is your body warning you to avoid that activity for another week until the pain goes down. d. You may drive when you are no longer taking prescription pain medication, you can comfortably sit for long periods of time, and you can safely maneuver your car and apply brakes. e. Dennis Bast may have sexual intercourse when it is comfortable.  7. FOLLOW UP in our office a. Please call [facility] at [phone] to set up an appointment to see your surgeon in the office for a follow-up appointment approximately 2 weeks after  your surgery. b. Make sure that you call for this appointment the day you arrive home to insure a convenient appointment time. 10. IF YOU HAVE DISABILITY OR FAMILY LEAVE FORMS, BRING THEM TO THE OFFICE FOR PROCESSING.  DO NOT GIVE THEM TO YOUR DOCTOR.        WHEN TO CALL us [phone]: 1. Poor pain control 2. Reactions / problems with new medications (rash/itching, nausea, etc)  3. Fever over 101.5 F (38.5 C) 4. Inability to urinate 5. Nausea and/or vomiting 6. Worsening swelling or bruising 7. Continued bleeding from incision. 8. Increased pain, redness, or drainage from the incision  The clinic staff is available to answer your questions during regular business hours (8:30am-5pm).  Please dont hesitate to call and ask to speak to one of our nurses for clinical concerns.   A surgeon from [facility] is always on call at the hospitals   If you have a medical emergency, go to the nearest emergency room or call 911.    [facility] [address], [place], [place]  [address] ? MAIN: [phone]) [phone] ? TOLL FREE: [phone] ? FAX [phone] [email or web address]    Anal Fissure, Adult An anal fissure is a small tear or crack in the skin around the anus. Bleeding from a fissure usually stops on its own within a few minutes. However, bleeding will often occur again with each bowel movement until the crack heals. What are the causes? This condition may be caused by:  Passing large, hard stool (feces).  Frequent diarrhea.  Constipation.  Inflammatory bowel disease (Crohn disease or ulcerative colitis).  Infections.  Anal sex.  What are the signs or symptoms? Symptoms of this condition include:  Bleeding from the rectum.  Small amounts of blood seen on your stool, on toilet paper, or in the toilet after a bowel movement.  Painful bowel movements.  Itching or irritation around the anus.  How is this diagnosed? A health care provider may diagnose this condition by closely examining the anal area. An anal fissure can usually be seen with careful inspection. In some cases, a rectal exam may be performed, or a short tube (anoscope) may be used to examine the anal canal. How is this treated? Treatment for this condition may include:  Taking steps to avoid constipation. This may include making changes to your diet, such as increasing your intake of fiber or fluid.  Taking fiber supplements. These supplements can soften your stool to help make bowel movements easier. Your health care provider may also prescribe a stool softener if your stool is often hard.  Taking sitz baths. This may help to heal the tear.  Using medicated creams or ointments. These may be prescribed to lessen discomfort.  Follow these instructions at home: Eating and drinking  Avoid foods that may be constipating, such as bananas and dairy products.  Drink enough fluid to keep your urine clear or pale yellow.  Maintain a  diet that is high in fruits, whole grains, and vegetables. General instructions  Keep the anal area as clean and dry as possible.  Take sitz baths as told by your health care provider. Do not use soap in the sitz baths.  Take over-the-counter and prescription medicines only as told by your health care provider.  Use creams or ointments only as told by your health care provider.  Keep all follow-up visits as told by your health care provider. This is important. Contact a health  care provider if:  You have more bleeding.  You have a fever.  You have diarrhea that is mixed with blood.  You continue to have pain.  Your problem is getting worse rather than better. This information is not intended to replace advice given to you by your health care provider. Make sure you discuss any questions you have with your health care provider. Document Released: 03/13/2005 Document Revised: 07/21/2015 Document Reviewed: 06/08/2014 Elsevier Interactive Patient Education  2018 [employer]  General Anesthesia, Adult, Care After These instructions provide you with information about caring for yourself after your procedure. Your health care provider may also give you more specific instructions. Your treatment has been planned according to current medical practices, but problems sometimes occur. Call your health care provider if you have any problems or questions after your procedure. What can I expect after the procedure? After the procedure, it is common to have:  Vomiting.  A sore throat.  Mental slowness.  It is common to feel:  Nauseous.  Cold or shivery.  Sleepy.  Tired.  Sore or achy, even in parts of your body where you did not have surgery.  Follow these instructions at home: For at least 24 hours after the procedure:  Do not: ? Participate in activities where you could fall or become injured. ? Drive. ? Use heavy machinery. ? Drink alcohol. ? Take sleeping pills or  medicines that cause drowsiness. ? Make important decisions or sign legal documents. ? Take care of children on your own.  Rest. Eating and drinking  If you vomit, drink water, juice, or soup when you can drink without vomiting.  Drink enough fluid to keep your urine clear or pale yellow.  Make sure you have little or no nausea before eating solid foods.  Follow the diet recommended by your health care provider. General instructions  Have a responsible adult stay with you until you are awake and alert.  Return to your normal activities as told by your health care provider. Ask your health care provider what activities are safe for you.  Take over-the-counter and prescription medicines only as told by your health care provider.  If you smoke, do not smoke without supervision.  Keep all follow-up visits as told by your health care provider. This is important. Contact a health care provider if:  You continue to have nausea or vomiting at home, and medicines are not helpful.  You cannot drink fluids or start eating again.  You cannot urinate after 8-12 hours.  You develop a skin rash.  You have fever.  You have increasing redness at the site of your procedure. Get help right away if:  You have difficulty breathing.  You have chest pain.  You have unexpected bleeding.  You feel that you are having a life-threatening or urgent problem. This information is not intended to replace advice given to you by your health care provider. Make sure you discuss any questions you have with your health care provider. Document Released: 06/19/2000 Document Revised: 08/16/2015 Document Reviewed: 02/25/2015 Elsevier Interactive Patient Education  [employer]2018 Elsevier Inc.

## 2017-02-05 NOTE — Anesthesia Procedure Notes (Signed)
Procedure Name: Intubation Date/Time: 02/05/2017 7:49 AM Performed by: Thornell Mule, CRNA Pre-anesthesia Checklist: Patient identified, Emergency Drugs available, Suction available and Patient being monitored Patient Re-evaluated:Patient Re-evaluated prior to induction Oxygen Delivery Method: Circle system utilized Preoxygenation: Pre-oxygenation with 100% oxygen Induction Type: IV induction Ventilation: Mask ventilation without difficulty Laryngoscope Size: Miller and 3 Grade View: Grade II Tube type: Oral Tube size: 7.5 mm Number of attempts: 1 Airway Equipment and Method: Stylet and Oral airway Placement Confirmation: ETT inserted through vocal cords under direct vision,  positive ETCO2 and breath sounds checked- equal and bilateral Secured at: 22 cm Tube secured with: Tape Dental Injury: Teeth and Oropharynx as per pre-operative assessment

## 2017-02-05 NOTE — Progress Notes (Addendum)
Discharge instructions reviewed with pt father Elijah Birk and pt. Both verbalized understanding of d/c instructions.  Was given a sitz bath and given instruction of use.  Pt also given an icepack, fluffy guaze, 4x4 gauze and ABD pads. Pt told to call office and/or go to ER should any life threatening symptoms occur such as cp, sob, light headedness, dizziness, increase bleeding from rectum including clots, uncontrolled pain or nausea/vomiting.  Pt verbalized understanding of this information. Alert and oriented at discharge. Extended the offer to pt to request pain pill from Dr. Michaell Cowing before discharge, but pt did not want to wait for the order, or wait 30 minutes after medication administration.  Pt states that pain is manageable enough at this time to be discharged and he verbalized that he would fill prescription for oxycodone and then take medication once he gets home.

## 2017-02-05 NOTE — Anesthesia Postprocedure Evaluation (Signed)
Anesthesia Post Note  Patient: Donald Grant  Procedure(s) Performed: REPAIR PERIRECTAL FISTULA ERAS PATHWAY (N/A Rectum) EXAM UNDER ANESTHESIA (N/A Rectum)     Patient location during evaluation: PACU Anesthesia Type: General Level of consciousness: awake Pain management: pain level controlled Vital Signs Assessment: post-procedure vital signs reviewed and stable Respiratory status: spontaneous breathing Cardiovascular status: stable Postop Assessment: no apparent nausea or vomiting Anesthetic complications: no    Last Vitals:  Vitals:   02/05/17 1000 02/05/17 1131  BP: 133/90 137/87  Pulse: 70 70  Resp: 16 18  Temp: 36.7 C (!) 36.3 C  SpO2: 97% 97%    Last Pain:  Vitals:   02/05/17 1131  TempSrc: Oral  PainSc: 6    Pain Goal: Patients Stated Pain Goal: 4 (02/05/17 1000)               Nerissa Constantin JR,JOHN Susann Givens

## 2017-02-05 NOTE — Interval H&P Note (Signed)
History and Physical Interval Note:  02/05/2017 7:34 AM  Donald Grant  has presented today for surgery, with the diagnosis of Anal fistula  The various methods of treatment have been discussed with the patient and family. After consideration of risks, benefits and other options for treatment, the patient has consented to  Procedure(s): REPAIR PERIRECTAL FISTULA ERAS PATHWAY (N/A) EXAM UNDER ANESTHESIA WITH POSSIBLE HEMORRHOIDECTOMY (N/A) as a surgical intervention .  The patient's history has been reviewed, patient examined, no change in status, stable for surgery.  I have reviewed the patient's chart and labs.  Questions were answered to the patient's satisfaction.     [name] C.

## 2017-02-05 NOTE — Progress Notes (Signed)
Spoke with Dr. Michaell Cowing on the phone regarding question from pt. PT wanted to make sure that he was okay to take his routine meds this afternoon. Dr. Michaell Cowing verified that he could take everything as normal, except for pradaxa which he will resume tomorrow. Pt notified and verbalized understanding of this information.

## 2017-02-05 NOTE — H&P (Signed)
Donald Grant [facility] 12/12/2016 10:21 AM Location: [facility] Patient #: [id number] DOB: 1975-04-03 Married / Language: English / Race: White Male  Patient Care Team: Mosetta Putt, Grant as PCP - General (Family Medicine) [name], Grant as Consulting Physician (Cardiology) Tiburcio Pea Leafy Kindle, NP as Nurse Practitioner (Cardiology) Karie Soda, Grant as Consulting Physician (General Surgery)    History of Present Illness Donald Sportsman Grant; 12/13/2016 2:16 PM) The patient is a 41 year old male who presents with anal fistula. Note for "Anal fistula": ` ` ` Patient sent for surgical consultation Chief Complaint: Anal drainage. 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  Procedure (Date: 12/19/2012):  Examination under anesthesia, excision of chronic anal sinus tract.  Pathology consistent with fistulous-like tract. No Crohn's. No cancer.  The patient is a 41 year old male that had some persistent anal drainage. I did examination under anesthesia September 2014. Had a chronic sinus tract but I cannot prove a fistula. I excised the. The wound gradually closed up over a few months. Pathology just showed fistula tract. No cancer or Crohn's or other abnormality. He returned later in 2015. I was concerned he may have recurrent fistula. I discussed surgery with him. As best I can gather, that did not happen. He's had issues with atrial fibrillation and dysrhythmias. He's had ablations. Has been on anticoagulation.  He comes again 3 years later. His cardiac issues became more complicated. He's been managed at [facility]. The drainage was not too severe, so he delayed the operation. Physically he is better. However he still has persistent anal symptoms, he is here to rediscuss reoperation. I believe the cardiologist for concern about infection risk with his pacemaker in place and wished him to try and readdress it as well  (Review of systems as stated in this  history (HPI) or in the review of systems. Otherwise all other 12 point ROS are negative)    Diagnosis Anus, fistula, sinus - POLYPOID FRAGMENTS OF SLIGHTLY INFLAMED SQUAMOUS EPITHELIUM WITH UNDERLYING ACUTE AND CHRONIC INFLAMMATION CONSISTENT WITH FISTULA. - NO DYSPLASIA OR MALIGNANCY. Donald Grant Pathologist, Electronic Signature (Case signed 12/20/2012) Specimen Donald Grant and Clinical Information Specimen(s) Obtained: Anus, fistula, sinus Donald Grant The specimen is received in formalin and consists of three pieces of tan-red, hemorrhagic and partially cauterized soft tissue ranging from 0.9 x 0.8 x 0.6 cm to 2.6 x 0.9 x 0.4 cm. The largest piece of soft tissue displays a 0.9 x 0.4 cm ellipse of tan-white skin, with a 0.4 cm defect. Representative sections are submitted in one cassette, to include full thickness section through skin defect. (KL:ecj 12/19/2012) Report signed out from the following location(s) Technical Component performed at [facility]. [address],[place] [address].CLIA:[id number]., Interpretation performed at [facility] [address], [place], [place] [address]. 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 Excision of chronic anal sinus tract. No proof of true fistula  Diagnostic Studies History Donald Sportsman, Grant; 12/12/2016 10:37 AM) Colonoscopy  never  Allergies ([name], RMA; 12/12/2016 10:24 AM) No Known Drug Allergies 02/02/2014 Allergies Reconciled   Medication History ([name], RMA; 12/12/2016 10:24 AM) Zovirax (400MG  Tablet, Oral as needed) Active. Allopurinol (300MG  Tablet, Oral daily) Active. Lipitor (20MG  Tablet, Oral daily) Active. Colchicine (0.6MG  Capsule, Oral daily)  Active. Pradaxa (150MG  Capsule, Oral daily) Active. Fish  Oil + D3 (1000-1000MG -UNIT Capsule, Oral daily) Active. Multiple Vitamin (Oral daily) Active. Entresto (49-51MG  Tablet, Oral daily) Active. Aldactone (25MG  Tablet, Oral daily) Active. Medications Reconciled  Social History Donald Sportsman, Grant; 12/12/2016 10:37 AM) Alcohol use  Heavy alcohol use. Illicit drug use  Uses socially only. No caffeine use  Tobacco use  Former smoker.  Family History Donald Sportsman, Grant; 12/12/2016 10:37 AM) Heart Disease  Mother, Sister. Heart disease in male family member before age 88  Hypertension  Father.  Other Problems Donald Sportsman, Grant; 12/12/2016 10:37 AM) Alcohol Abuse  Anxiety Disorder  Atrial Fibrillation  Congestive Heart Failure  Gastroesophageal Reflux Disease  Hypercholesterolemia     Review of Systems Donald Sportsman, Grant; 12/12/2016 10:37 AM) General Not Present- Appetite Loss, Chills, Fatigue, Fever, Night Sweats, Weight Gain and Weight Loss. Skin Present- Non-Healing Wounds. Not Present- Change in Wart/Mole, Dryness, Hives, Jaundice, New Lesions, Rash and Ulcer. HEENT Not Present- Earache, Hearing Loss, Hoarseness, Nose Bleed, Oral Ulcers, Ringing in the Ears, Seasonal Allergies, Sinus Pain, Sore Throat, Visual Disturbances, Wears glasses/contact lenses and Yellow Eyes. Respiratory Not Present- Bloody sputum, Chronic Cough, Difficulty Breathing, Snoring and Wheezing. Cardiovascular Present- Palpitations. Not Present- Chest Pain, Difficulty Breathing Lying Down, Leg Cramps, Rapid Heart Rate, Shortness of Breath and Swelling of Extremities. Gastrointestinal Present- Change in Bowel Habits. Not Present- Abdominal Pain, Bloating, Bloody Stool, Chronic diarrhea, Constipation, Difficulty Swallowing, Excessive gas, Gets full quickly at meals, Hemorrhoids, Indigestion, Nausea, Rectal Pain and Vomiting. Male Genitourinary Not Present- Blood in Urine, Change in  Urinary Stream, Frequency, Impotence, Nocturia, Painful Urination, Urgency and Urine Leakage. Musculoskeletal Not Present- Back Pain, Joint Pain, Joint Stiffness, Muscle Pain, Muscle Weakness and Swelling of Extremities.  Vitals (Tanisha A. Brown RMA; 12/12/2016 10:24 AM) 12/12/2016 10:23 AM Weight: 259 lb Height: 74in Body Surface Area: 2.43 m Body Mass Index: 33.25 kg/m  Temp.: 96.65F  Pulse: 73 (Regular)  P.OX: 98% (Room air) BP: 124/86 (Sitting, Left Arm, Standard)   BP (!) 143/95   Pulse 81   Temp 97.7 F (36.5 C) (Oral)   Resp 18   Ht 6\' 2"  (1.88 m)   Wt 117.5 kg (259 lb)   SpO2 99%   BMI 33.25 kg/m      Physical Exam Donald Sportsman Grant; 12/13/2016 2:18 PM) General Mental Status-Alert. General Appearance-Not in acute distress. Voice-Normal.  Integumentary Global Assessment Normal Exam - Distribution of scalp and body hair is normal. General Characteristics Overall Skin Surface - no rashes and no suspicious lesions.  Head and Neck Head-normocephalic, atraumatic with no lesions or palpable masses. Face Global Assessment - atraumatic, no absence of expression. Neck Global Assessment - no abnormal movements, no decreased range of motion. Trachea-midline. Thyroid Gland Characteristics - non-tender.  Eye Eyeball - Left-Extraocular movements intact, No Nystagmus. Eyeball - Right-Extraocular movements intact, No Nystagmus. Upper Eyelid - Left-No Cyanotic. Upper Eyelid - Right-No Cyanotic.  Chest and Lung Exam Inspection Accessory muscles - No use of accessory muscles in breathing.  Abdomen Note: Abdomen soft. Nontender, nondistended. No guarding. No diastasis. No umbilical nor other hernias   Male Genitourinary Note: No inguinal hernias. Normal external genitalia. Epididymi, testes, and spermatic cords normal without any masses.   Rectal Note: posterior mid fne the anal verge.g with open sinus about 15 mm from the anal  verge. Early to the left. Similar to prior location. Pinhole of drainage. I can feel a cord going toward the sphincter complex. I held off on digital or anoscopic  exam.f on anymore aggressive digital or anoscopic exam.  Perianal skin clean with good hygiene. No pruritis ani. No pilonidal disease. No fissure. Normal sphincter tone. No external hemorrhoids. No condyloma warts.    Peripheral Vascular Upper Extremity Inspection - Left - Not Gangrenous, No Petechiae. Right - Not Gangrenous, No Petechiae.  Neurologic Neurologic evaluation reveals -normal attention span and ability to concentrate, able to name objects and repeat phrases. Appropriate fund of knowledge and normal coordination.  Neuropsychiatric Mental status exam performed with findings of-able to articulate well with normal speech/language, rate, volume and coherence and no evidence of hallucinations, delusions, obsessions or homicidal/suicidal ideation. Orientation-oriented X3.  Musculoskeletal Global Assessment Gait and Station - normal gait and station.  Lymphatic General Lymphatics Description - No Generalized lymphadenopathy.    Assessment & Plan Donald Grant(Cayson Kalb C. 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    I recommended examination Exam under anesthesia. Superficial fistulotomy versus repair.  He will require cardiac clearance given the fact that he is on chronic anticoagulation now for his atrial fibrillation. He has good exercise tolerance. No new events since last seen earlier this year. Hopefully OK to hold Pradaxa a few days before surgery if Cardiology (Drs Allred/McClean) agree. Restart soon after.  The anatomy & physiology of the anorectal region was discussed. We discussed the pathophysiology of anorectal abscess and fistula. Differential diagnosis was discussed. Natural history progression was discussed. I stressed the  importance of a bowel regimen to have daily soft bowel movements to minimize progression of disease.  The patient's condition is not adequately controlled. Non-operative treatment has not healed the fistula. Therefore, I recommended examination under anaesthesia to confirm the diagnosis and treat the fistula. I discussed techniques that may be required such as fistulotomy, ligation by LIFT technique, and/or seton placement. Benefits & alternatives discussed. I noted a good likelihood this will help address the problem, but sometimes repeat operations and prolonged healing times may occur. Risks such as bleeding, pain, recurrence, reoperation, incontinence, heart attack, stroke, death, and other risks were discussed.  Educational handouts further explaining the pathology, treatment options, and bowel regimen were given. The patient expressed understanding & wishes to proceed. We will work to coordinate surgery for a mutually convenient time. Current Plans Referred to Cardiology, for evaluation and follow up (Cardiology). Pt Education - [facility] Abscess/Fistula (AT): discussed with patient and provided information. The anatomy & physiology of the anorectal region was discussed. We discussed the pathophysiology of anorectal abscess and fistula. Differential diagnosis was discussed. Natural history progression was discussed. I stressed the importance of a bowel regimen to have daily soft bowel movements to minimize progression of disease.  The patient's condition is not adequately controlled. Non-operative treatment has not healed the fistula. Therefore, I recommended examination under anaesthesia to confirm the diagnosis and treat the fistula. I discussed techniques that may be required such as fistulotomy, ligation by LIFT technique, and/or seton placement. Benefits & alternatives discussed. I noted a good likelihood this will help address the problem, but sometimes repeat operations and prolonged  healing times may occur. Risks such as bleeding, pain, recurrence, reoperation, incontinence, heart attack, death, and other risks were discussed.  Educational handouts further explaining the pathology, treatment options, and bowel regimen were given. The patient expressed understanding & wishes to proceed. We will work to coordinate surgery for a mutually convenient time.  ENCOUNTER FOR PREOPERATIVE EXAMINATION FOR GENERAL SURGICAL PROCEDURE (Z01.818) Current Plans You are being scheduled for surgery-  Our schedulers will call you.  You should hear from our office's scheduling department within 5 working days about the location, date, and time of surgery. We try to make accommodations for patient's preferences in scheduling surgery, but sometimes the OR schedule or the surgeon's schedule prevents [place] from making those accommodations.  If you have not heard from our office [phone]) in 5 working days, call the office and ask for your surgeon's nurse.  If you have other questions about your diagnosis, plan, or surgery, call the office and ask for your surgeon's nurse.  Pt Education - [facility] Rectal Prep for Anorectal outpatient/office surgery: discussed with patient and provided information. Pt Education - [facility] Rectal Surgery HCI (Neno Hohensee): discussed with patient and provided information. Pt Education - [facility] Good Bowel Health (Mykah Shin)  Donald Grant, M.D., F.A.C.S. Gastrointestinal and Minimally Invasive Surgery [facility] [address], [address] [place], [place] [address] [phone] Main / Paging

## 2017-02-05 NOTE — Transfer of Care (Signed)
Immediate Anesthesia Transfer of Care Note  Patient: Donald Grant  Procedure(s) Performed: REPAIR PERIRECTAL FISTULA ERAS PATHWAY (N/A Rectum) EXAM UNDER ANESTHESIA (N/A Rectum)  Patient Location: PACU  Anesthesia Type:General  Level of Consciousness: awake, alert  and oriented  Airway & Oxygen Therapy: Patient Spontanous Breathing and Patient connected to face mask oxygen  Post-op Assessment: Report given to RN and Post -op Vital signs reviewed and stable  Post vital signs: Reviewed and stable  Last Vitals:  Vitals:   02/05/17 0527 02/05/17 0917  BP: (!) 143/95 (!) 137/92  Pulse: 81 71  Resp: 18 16  Temp: 36.5 C 36.4 C  SpO2: 99% 100%    Last Pain:  Vitals:   02/05/17 0527  TempSrc: Oral      Patients Stated Pain Goal: 4 (02/05/17 0558)  Complications: No apparent anesthesia complications

## 2017-02-05 NOTE — Op Note (Signed)
02/05/2017  9:04 AM  PATIENT:  Donald Grant  41 y.o. male  Patient Care Team: Mosetta PuttBlomgren, Peter, MD as PCP - General (Family Medicine) Hillis RangeAllred, James, MD as Consulting Physician (Cardiology) Tiburcio PeaHarris, Leafy Kindleiana Michelle, NP as Nurse Practitioner (Cardiology) Karie SodaGross, Amiliah Campisi, MD as Consulting Physician (General Surgery)  PRE-OPERATIVE DIAGNOSIS:  Anal fistula  POST-OPERATIVE DIAGNOSIS:  Intersphincteric posterior perirectal fistula   PROCEDURE:    LIFT repair of intersphincteric perirectal fistula (ligation of intersphincteric fistulous tract) EXAM UNDER ANESTHESIA  SURGEON:  Ardeth SportsmanSteven C. Aarik Blank, MD  ASSISTANT: RN   ANESTHESIA:   Local field block Anorectal block General  0.25% bupivacaine with epinephrine at the beginning of the case.  Liposomal bupivacaine (Experel) at the end of the case.  EBL:  No intake/output data recorded..  See anesthesia record  Delay start of Pharmacological VTE agent (>24hrs) due to surgical blood loss or risk of bleeding:  no  DRAINS: none   SPECIMEN:  Source of Specimen:  Fistulous tract  DISPOSITION OF SPECIMEN:  PATHOLOGY  COUNTS:  YES  PLAN OF CARE: Discharge to home after PACU  PATIENT DISPOSITION:  PACU - hemodynamically stable.  INDICATION: Patient with episodes of recurrent perirectal abscess.  Chronic posterior midline sinuses suspicious for probable perirectal fistula.  I recommended examination and surgical treatment:  The anatomy & physiology of the anorectal region was discussed.  We discussed the pathophysiology of anorectal abscess and fistula.  Differential diagnosis was discussed.  Natural history progression was discussed.   I stressed the importance of a bowel regimen to have daily soft bowel movements to minimize progression of disease.     The patient's condition is not adequately controlled.  Non-operative treatment has not healed the fistula.  Therefore, I recommended examination under anaesthesia to confirm the diagnosis  and treat the fistula.  I discussed techniques that may be required such as fistulotomy, ligation by LIFT technique, and/or seton placement.  Benefits & alternatives discussed.  I noted a good likelihood this will help address the problem, but sometimes repeat operations and prolonged healing times may occur.  Risks such as bleeding, pain, recurrence, reoperation, incontinence, heart attack, death, and other risks were discussed.      Educational handouts further explaining the pathology, treatment options, and bowel regimen were given.  The patient expressed understanding & wishes to proceed.  We will work to coordinate surgery for a mutually convenient time.   OR FINDINGS: Patient had an intersphincteric fistula.    External location POSTERIOR MIDLINE   about 2 cm from anal verge.  Internal location : Posterior midline at anal crypt about 1 cm from anal verge.  DESCRIPTION:   Informed consent was confirmed. Patient underwent general anesthesia without difficulty. Patient was placed into prone positioning.  The perianal region was prepped and draped in sterile fashion. Surgical timeout confirmed or plan.  I did digital rectal examination and then transitioned over to anoscopy to get a sense of the anatomy.  I did place a probe through the external opening.  I also injected the track with methylene blue.  With this I was able to locate an internal opening.  The tract did not feel superficial, concerning for a probable intersphincteric fistula.  No abscess located.  I went ahead and proceeded with the LIFT technique.  I made a transverse incision at the anal squamocolumnar junction posterior midline x 15 mm.  Did careful dissection to get down to the sphincter complex.  I carefully went between the internal and external sphincter using  careful blunt dissection parallel to the fibersr.  I was able to get around it gently with a right angle clamp.  I carefully skeletonized the intersphincteric  component. I passed 2-0 Vicryl sutures around it proximally and distally to expose the intersphincteric component of the fistulous tract. I placed 2-0 Vicryl stitches through the intersphincteric tract on the proximal side and on the distal side in between the external & internal sphincters.  I tied the free tie passes as well for a second ligation.  I transected the fistulous tract.  Did another ligation of the anterior sphincteric fistula tract stumps on the internal sphincter and external sphincter 90 degrees from the prior ligation.  I removed the superficial end of the fistulous tract & ligated the base of the external wound just outside the sphincter component with 2-0 vicryl. I began to excise the external opening with a radial biconcave incision around it.  I transitioned to cautery and help free the fistulous tract circumferentially all way down towards the sphincter component.   I ended up excising some redundant folded over skin to have a nice open wide wound that would not close down.  I then transitioned to the rectal component.  Did a figure-of-eight stitch of 2-0 Vicryl suture several centimeters proximal to the internal opening in the rectum.  I ran that longitudinally until it came to the opening.  I transected the rectal tissue anorectal component and opening like an internal hemorrhoidectomy to get healthier tissue.  I then ran the 2-0 Vicryl stitch down to the anal verge.  I transversely closed the wound with 2-0 Vicryl interrupted mattress sutures where I had done on the intersphincteric ligation wound as well.  I tied that running suture down, thus closing the internal opening and protecting the LIFT repair.  Hemostasis was excellent.  I reexamined the anal canal.   There is was no narrowing.  Hairs were plucked away.  Posterior midline wound was broad to avoid re-cannulization.  Hemostasis was good.  We placed fluff gauze to onlay over the wounds.  No packing done.  Patient is being  extubated go to recovery room.  I discussed operative findings, updated the patient's status, discussed probable steps to recovery, and gave postoperative recommendations to the patient's family.  Recommendations were made.  Questions were answered.  He expressed understanding & appreciation.   Ardeth Sportsman, M.D., F.A.C.S. Gastrointestinal and Minimally Invasive Surgery [facility] [address] [place], [place] [address] [phone] Main / Paging

## 2017-02-07 LAB — CUP PACEART REMOTE DEVICE CHECK
Brady Statistic AP VS Percent: 0.3 %
Brady Statistic AS VS Percent: 0.01 %
Brady Statistic RA Percent Paced: 99.79 %
HIGH POWER IMPEDANCE MEASURED VALUE: 74 Ohm
Implantable Lead Implant Date: 20160628
Implantable Lead Implant Date: 20160628
Implantable Lead Location: 753858
Implantable Lead Location: 753860
Implantable Lead Model: 5076
Implantable Pulse Generator Implant Date: 20160628
Lead Channel Impedance Value: 247 Ohm
Lead Channel Impedance Value: 399 Ohm
Lead Channel Impedance Value: 475 Ohm
Lead Channel Impedance Value: 513 Ohm
Lead Channel Pacing Threshold Amplitude: 0.625 V
Lead Channel Pacing Threshold Pulse Width: 0.4 ms
Lead Channel Sensing Intrinsic Amplitude: 2.5 mV
Lead Channel Sensing Intrinsic Amplitude: 9.875 mV
Lead Channel Setting Pacing Amplitude: 2 V
Lead Channel Setting Sensing Sensitivity: 0.3 mV
MDC IDC LEAD IMPLANT DT: 20160628
MDC IDC LEAD LOCATION: 753859
MDC IDC MSMT BATTERY REMAINING LONGEVITY: 22 mo
MDC IDC MSMT BATTERY VOLTAGE: 2.91 V
MDC IDC MSMT LEADCHNL LV IMPEDANCE VALUE: 304 Ohm
MDC IDC MSMT LEADCHNL LV IMPEDANCE VALUE: 475 Ohm
MDC IDC MSMT LEADCHNL LV PACING THRESHOLD AMPLITUDE: 0.875 V
MDC IDC MSMT LEADCHNL LV PACING THRESHOLD PULSEWIDTH: 0.8 ms
MDC IDC MSMT LEADCHNL RA SENSING INTR AMPL: 2.75 mV
MDC IDC MSMT LEADCHNL RV SENSING INTR AMPL: 10.375 mV
MDC IDC SESS DTM: 20181108231303
MDC IDC SET LEADCHNL LV PACING AMPLITUDE: 2.5 V
MDC IDC SET LEADCHNL LV PACING PULSEWIDTH: 0.8 ms
MDC IDC SET LEADCHNL RV PACING AMPLITUDE: 0.5 V
MDC IDC SET LEADCHNL RV PACING PULSEWIDTH: 0.03 ms
MDC IDC STAT BRADY AP VP PERCENT: 99.6 %
MDC IDC STAT BRADY AS VP PERCENT: 0.09 %
MDC IDC STAT BRADY RV PERCENT PACED: 99.04 %

## 2017-02-11 ENCOUNTER — Other Ambulatory Visit (HOSPITAL_COMMUNITY): Payer: Self-pay | Admitting: Cardiology

## 2017-03-28 ENCOUNTER — Telehealth (HOSPITAL_COMMUNITY): Payer: Self-pay | Admitting: Pharmacist

## 2017-03-28 NOTE — Telephone Encounter (Signed)
Donald Grant does not require PA for [facility] 2019.   Tyler Deis. Bonnye Fava, PharmD, BCPS, CPP Clinical Pharmacist Pager: [phone] Phone: [phone] 03/28/2017 2:38 PM

## 2017-04-05 ENCOUNTER — Other Ambulatory Visit: Payer: Self-pay | Admitting: Internal Medicine

## 2017-04-05 NOTE — Telephone Encounter (Signed)
Pt last saw Dr Shirlee Latch 11/24/16, last labs 01/29/17 Creat 0.74, age 43, weight 117.5kg, CrCl 216.12, based on CrCl pt is on appropriate dosage of Pradaxa 150mg  BID.  Will refill rx.

## 2017-04-12 ENCOUNTER — Other Ambulatory Visit: Payer: Self-pay | Admitting: Internal Medicine

## 2017-04-12 DIAGNOSIS — I482 Chronic atrial fibrillation, unspecified: Secondary | ICD-10-CM

## 2017-04-26 ENCOUNTER — Other Ambulatory Visit: Payer: Self-pay | Admitting: Internal Medicine

## 2017-04-26 NOTE — Telephone Encounter (Signed)
Follow up      *STAT* If patient is at the pharmacy, call can be transferred to refill team.   1. Which medications need to be refilled? (please list name of each medication and dose if known) dofetilide (TIKOSYN) 500 MCG capsule  2. Which pharmacy/location (including street and city if local pharmacy) is medication to be sent to? [facility] [address]  3. Do they need a 30 day or 90 day supply? 90

## 2017-05-03 ENCOUNTER — Ambulatory Visit (INDEPENDENT_AMBULATORY_CARE_PROVIDER_SITE_OTHER): Payer: 59 | Admitting: *Deleted

## 2017-05-03 DIAGNOSIS — I420 Dilated cardiomyopathy: Secondary | ICD-10-CM | POA: Diagnosis not present

## 2017-05-03 NOTE — Progress Notes (Signed)
Remote ICD transmission.   

## 2017-05-09 ENCOUNTER — Encounter: Payer: Self-pay | Admitting: Cardiology

## 2017-05-11 ENCOUNTER — Other Ambulatory Visit: Payer: Self-pay | Admitting: Internal Medicine

## 2017-05-11 DIAGNOSIS — I482 Chronic atrial fibrillation, unspecified: Secondary | ICD-10-CM

## 2017-05-18 ENCOUNTER — Other Ambulatory Visit (HOSPITAL_COMMUNITY): Payer: Self-pay | Admitting: Cardiology

## 2017-05-25 LAB — CUP PACEART REMOTE DEVICE CHECK
Battery Remaining Longevity: 22 mo
Battery Voltage: 2.94 V
Brady Statistic AP VS Percent: 0.3 %
Brady Statistic AS VS Percent: 0.09 %
HIGH POWER IMPEDANCE MEASURED VALUE: 68 Ohm
Implantable Lead Implant Date: 20160628
Implantable Lead Implant Date: 20160628
Implantable Lead Implant Date: 20160628
Implantable Lead Location: 753858
Implantable Lead Model: 3830
Implantable Pulse Generator Implant Date: 20160628
Lead Channel Impedance Value: 247 Ohm
Lead Channel Impedance Value: 418 Ohm
Lead Channel Pacing Threshold Amplitude: 0.875 V
Lead Channel Pacing Threshold Pulse Width: 0.8 ms
Lead Channel Sensing Intrinsic Amplitude: 1.75 mV
Lead Channel Sensing Intrinsic Amplitude: 9.625 mV
Lead Channel Sensing Intrinsic Amplitude: 9.625 mV
Lead Channel Setting Pacing Amplitude: 2 V
Lead Channel Setting Pacing Pulse Width: 0.8 ms
MDC IDC LEAD LOCATION: 753859
MDC IDC LEAD LOCATION: 753860
MDC IDC MSMT LEADCHNL LV IMPEDANCE VALUE: 285 Ohm
MDC IDC MSMT LEADCHNL LV IMPEDANCE VALUE: 456 Ohm
MDC IDC MSMT LEADCHNL RA IMPEDANCE VALUE: 418 Ohm
MDC IDC MSMT LEADCHNL RA SENSING INTR AMPL: 1.75 mV
MDC IDC MSMT LEADCHNL RV IMPEDANCE VALUE: 361 Ohm
MDC IDC MSMT LEADCHNL RV PACING THRESHOLD AMPLITUDE: 0.625 V
MDC IDC MSMT LEADCHNL RV PACING THRESHOLD PULSEWIDTH: 0.4 ms
MDC IDC SESS DTM: 20190207093724
MDC IDC SET LEADCHNL LV PACING AMPLITUDE: 2.5 V
MDC IDC SET LEADCHNL RV PACING AMPLITUDE: 0.5 V
MDC IDC SET LEADCHNL RV PACING PULSEWIDTH: 0.03 ms
MDC IDC SET LEADCHNL RV SENSING SENSITIVITY: 0.3 mV
MDC IDC STAT BRADY AP VP PERCENT: 99.5 %
MDC IDC STAT BRADY AS VP PERCENT: 0.11 %
MDC IDC STAT BRADY RA PERCENT PACED: 99.66 %
MDC IDC STAT BRADY RV PERCENT PACED: 98.59 %

## 2017-05-28 ENCOUNTER — Encounter: Payer: Self-pay | Admitting: Internal Medicine

## 2017-05-28 ENCOUNTER — Ambulatory Visit: Payer: 59 | Admitting: Internal Medicine

## 2017-05-28 VITALS — BP 122/82 | HR 88 | Ht 74.0 in | Wt 261.0 lb

## 2017-05-28 DIAGNOSIS — Z95810 Presence of automatic (implantable) cardiac defibrillator: Secondary | ICD-10-CM

## 2017-05-28 DIAGNOSIS — I495 Sick sinus syndrome: Secondary | ICD-10-CM | POA: Diagnosis not present

## 2017-05-28 DIAGNOSIS — I481 Persistent atrial fibrillation: Secondary | ICD-10-CM | POA: Diagnosis not present

## 2017-05-28 DIAGNOSIS — I428 Other cardiomyopathies: Secondary | ICD-10-CM | POA: Diagnosis not present

## 2017-05-28 DIAGNOSIS — I4819 Other persistent atrial fibrillation: Secondary | ICD-10-CM

## 2017-05-28 LAB — CUP PACEART INCLINIC DEVICE CHECK
Battery Remaining Longevity: 21 mo
Brady Statistic AP VP Percent: 99.45 %
Brady Statistic AP VS Percent: 0.38 %
Brady Statistic AS VS Percent: 0.08 %
Brady Statistic RA Percent Paced: 99.69 %
Brady Statistic RV Percent Paced: 98.6 %
Date Time Interrogation Session: 20190304133158
HighPow Impedance: 72 Ohm
Implantable Lead Implant Date: 20160628
Implantable Lead Location: 753858
Implantable Lead Location: 753860
Implantable Lead Model: 5076
Lead Channel Impedance Value: 247 Ohm
Lead Channel Impedance Value: 418 Ohm
Lead Channel Impedance Value: 475 Ohm
Lead Channel Impedance Value: 513 Ohm
Lead Channel Pacing Threshold Amplitude: 0.875 V
Lead Channel Sensing Intrinsic Amplitude: 1.75 mV
Lead Channel Sensing Intrinsic Amplitude: 9.625 mV
Lead Channel Sensing Intrinsic Amplitude: 9.625 mV
Lead Channel Setting Pacing Amplitude: 0.5 V
Lead Channel Setting Sensing Sensitivity: 0.3 mV
MDC IDC LEAD IMPLANT DT: 20160628
MDC IDC LEAD IMPLANT DT: 20160628
MDC IDC LEAD LOCATION: 753859
MDC IDC MSMT BATTERY VOLTAGE: 2.93 V
MDC IDC MSMT LEADCHNL LV IMPEDANCE VALUE: 304 Ohm
MDC IDC MSMT LEADCHNL LV PACING THRESHOLD PULSEWIDTH: 0.8 ms
MDC IDC MSMT LEADCHNL RA SENSING INTR AMPL: 0.5 mV
MDC IDC MSMT LEADCHNL RV IMPEDANCE VALUE: 361 Ohm
MDC IDC MSMT LEADCHNL RV PACING THRESHOLD AMPLITUDE: 0.625 V
MDC IDC MSMT LEADCHNL RV PACING THRESHOLD PULSEWIDTH: 0.4 ms
MDC IDC PG IMPLANT DT: 20160628
MDC IDC SET LEADCHNL LV PACING AMPLITUDE: 2.5 V
MDC IDC SET LEADCHNL LV PACING PULSEWIDTH: 0.8 ms
MDC IDC SET LEADCHNL RA PACING AMPLITUDE: 2 V
MDC IDC SET LEADCHNL RV PACING PULSEWIDTH: 0.03 ms
MDC IDC STAT BRADY AS VP PERCENT: 0.09 %

## 2017-05-28 NOTE — Progress Notes (Signed)
PCP: Mosetta Putt, MD Primary Cardiologist:  Dr Shirlee Latch Primary EP: Dr Johney Frame  Donald Grant is a 42 y.o. male who presents today for routine electrophysiology followup.  Since last being seen in our clinic, the patient reports doing very well. He is singing 4-5 nights per week and is active without limitation. Today, he denies symptoms of palpitations, chest pain, shortness of breath,  lower extremity edema, dizziness, presyncope, syncope, or ICD shocks.  The patient is otherwise without complaint today.   Past Medical History:  Diagnosis Date  . Anxiety   . Atrial fibrillation (HCC)    PVI ablatioin at [facility] 04/21/14 and May 2016 Dr. [name]  . Atrial flutter [facility])    CTI ablation by Dr [name] 02/24/11  . Atrial tachycardia (HCC)    mapped to AV node by Dr [name], ablation not performed  . Cardiomyopathy, nonischemic (HCC)    Medtronic BIVE ICD   . Dyslipidemia   . First degree AV block   . GERD (gastroesophageal reflux disease)   . Gout    bilateral feet  . Hx of cardiovascular stress test    Lexiscan Myoview (07/2013):  Inf and inferolateral scar, no ischemia, no gated  . Lymphadenopathy of right cervical region ?Mono 04/16/2013  . Premature ventricular contraction    Past Surgical History:  Procedure Laterality Date  . ABLATION OF DYSRHYTHMIC FOCUS  07/13/15   Atypical atrial flutter ablation at [facility] by Dr [name]  . ATRIAL ABLATION SURGERY  02/24/11   CTI ablation by Dr [name]  . ATRIAL FIBRILLATION ABLATION  04/24/14, 08/13/14   PVI at [facility] by Dr [name], PVI with posterior wall Box and FIRM ablation performed  . ATRIAL FLUTTER ABLATION N/A 02/24/2011   Procedure: ATRIAL FLUTTER ABLATION;  Surgeon: [name], MD;  Location: [facility] CATH LAB;  Service: Cardiovascular;  Laterality: N/A;  . CARDIAC DEFIBRILLATOR PLACEMENT    . CARDIOVERSION  03/29/2011   Procedure: CARDIOVERSION;  Surgeon: [name], MD;  Location: [facility] OR;  Service: Cardiovascular;  Laterality: N/A;  .  CARDIOVERSION N/A 02/25/2015   Procedure: CARDIOVERSION;  Surgeon: [name], MD;  Location: [facility] ENDOSCOPY;  Service: Cardiovascular;  Laterality: N/A;  . CARDIOVERSION (BEDSIDE)  03/29/2011      . ELECTROPHYSIOLOGIC STUDY N/A 06/21/2015   Procedure: Cardioversion;  Surgeon: [name], MD;  Location: [facility] INVASIVE CV LAB;  Service: Cardiovascular;  Laterality: N/A;  . EVALUATION UNDER ANESTHESIA WITH HEMORRHOIDECTOMY N/A 02/05/2017   Procedure: EXAM UNDER ANESTHESIA;  Surgeon: Karie Soda, MD;  Location: [facility] ORS;  Service: General;  Laterality: N/A;  . EXAMINATION UNDER ANESTHESIA  12/19/12   Anal Fistula  . FISTULOTOMY N/A 02/05/2017   Procedure: REPAIR PERIRECTAL FISTULA ERAS PATHWAY;  Surgeon: Karie Soda, MD;  Location: [facility] ORS;  Service: General;  Laterality: N/A;  . MOUTH SURGERY    . PACEMAKER IMPLANT    . ROOT CANAL      ROS- all systems are reviewed and negative except as per HPI above  Current Outpatient Medications  Medication Sig Dispense Refill  . acyclovir (ZOVIRAX) 400 MG tablet Take 400 mg by mouth 2 (two) times daily as needed (outbreak).     [name] allopurinol (ZYLOPRIM) 300 MG tablet Take 300 mg by mouth every morning.     [name] ALPRAZolam (XANAX) 0.5 MG tablet Take 0.25 tablets by mouth daily as needed for anxiety.   1  . AMBULATORY NON FORMULARY MEDICATION Place 1 application 4 (four) times daily rectally. DILTIAZEM  2% compounded suspension.  (Get Rx filled at a Set designer, such as [facility] or [facility] in [place], [place]) 1 Tube 2  . atorvastatin (LIPITOR) 20 MG tablet Take 20 mg by mouth every evening.     . calcium carbonate (TUMS - DOSED IN MG ELEMENTAL CALCIUM) 500 MG chewable tablet Chew 2-3 tablets by mouth daily as needed for indigestion or heartburn.    . carvedilol (COREG) 6.25 MG tablet TAKE 1 TABLET TWICE A DAY **MUST HAVE OV FOR MORE REFILLS** 30 tablet 0  . Cholecalciferol (VITAMIN D) 2000 units CAPS Take 4,000 Units by mouth  daily.    [name] dofetilide (TIKOSYN) 500 MCG capsule TAKE 1 CAPSULE BY MOUTH 2 TIMES DAILY *MAKE AN APPT W/ DR. Jeannetta Cerutti FOR DECEMBER FOR REFILLS* 180 capsule 3  . ENTRESTO 49-51 MG TAKE 1 TABLET BY MOUTH TWICE A DAY 180 tablet 3  . fish oil-omega-3 fatty acids 1000 MG capsule Take 2 g by mouth every evening.     . indomethacin (INDOCIN) 25 MG capsule Take 25 mg by mouth 3 (three) times daily as needed (gout).    . magnesium oxide (MAG-OX) 400 (241.3 Mg) MG tablet Take 400 mg by mouth every morning.   11  . Multiple Vitamin (MULITIVITAMIN WITH MINERALS) TABS Take 1 tablet by mouth every evening.     [name] oxyCODONE (OXY IR/ROXICODONE) 5 MG immediate release tablet Take 1-2 tablets (5-10 mg total) every 4 (four) hours as needed by mouth for moderate pain, severe pain or breakthrough pain. 40 tablet 0  . PRADAXA 150 MG CAPS capsule TAKE ONE CAPSULE BY MOUTH TWICE A DAY 60 capsule 5  . Psyllium (METAMUCIL PO) Take by mouth. Use 2 teaspoons into water once daily    . spironolactone (ALDACTONE) 25 MG tablet TAKE 1/2 TABLET BY MOUTH ONCE DAILY 15 tablet 3  . vitamin B-12 (CYANOCOBALAMIN) 1000 MCG tablet Take 1,000 mcg by mouth at bedtime.     No current facility-administered medications for this visit.     Physical Exam: Vitals:   05/28/17 1253  Weight: 261 lb (118.4 kg)  Height: 6\' 2"  (1.88 m)    GEN- The patient is well appearing, alert and oriented x 3 today.   Head- normocephalic, atraumatic Eyes-  Sclera clear, conjunctiva pink Ears- hearing intact Oropharynx- clear Lungs- Clear to ausculation bilaterally, normal work of breathing Chest- ICD pocket is well healed Heart- Regular rate and rhythm, no murmurs, rubs or gallops, PMI not laterally displaced GI- soft, NT, ND, + BS Extremities- no clubbing, cyanosis, or edema  ICD interrogation- reviewed in detail today,  See PACEART report  ekg tracing ordered today is personally reviewed and shows AV paced rhythm  Assessment and Plan:  1.   Chronic systolic dysfunction/ nonischemic CM euvolemic today Stable on an appropriate medical regimen Normal ICD function See Pace Art report No changes today  2. Persistent afib/ atypical atrial flutter S/p prior ablations at [facility] 532 msec with tikosyn Will obtain bmet, mg today [id number] score is 1.  On pardaxa (bmet, cbc today)  3. Advanced AV block S/p BiV ICD with LV as a His lead rather than CS lead  4. ETOH Cessation advised He is still drinking too much  I will follow closely with carelink Return to see EP NP in 6 months He will contact my office if problems arise in the interim. Follow-up at [facility] as scheduled  [name] MD, [facility] 05/28/2017 1:01 PM

## 2017-05-28 NOTE — Patient Instructions (Addendum)
Medication Instructions:  Your physician recommends that you continue on your current medications as directed. Please refer to the Current Medication list given to you today.  Labwork: You will get blood work today:  BMET, CBC and magnesium.  Testing/Procedures: None ordered.  Follow-Up: Your physician wants you to follow-up in: 6 months with Gypsy Balsam, NP.   You will receive a reminder letter in the mail two months in advance. If you don't receive a letter, please call our office to schedule the follow-up appointment.  Remote monitoring is used to monitor your ICD from home. This monitoring reduces the number of office visits required to check your device to one time per year. It allows [place] to keep an eye on the functioning of your device to ensure it is working properly. You are scheduled for a device check from home on 08/02/2017. You may send your transmission at any time that day. If you have a wireless device, the transmission will be sent automatically. After your physician reviews your transmission, you will receive a postcard with your next transmission date.  Any Other Special Instructions Will Be Listed Below (If Applicable).  If you need a refill on your cardiac medications before your next appointment, please call your pharmacy.

## 2017-05-29 LAB — BASIC METABOLIC PANEL
BUN/Creatinine Ratio: 17 (ref 9–20)
BUN: 13 mg/dL (ref 6–24)
CALCIUM: 9.5 mg/dL (ref 8.7–10.2)
CO2: 23 mmol/L (ref 20–29)
CREATININE: 0.78 mg/dL (ref 0.76–1.27)
Chloride: 102 mmol/L (ref 96–106)
GFR calc Af Amer: 129 mL/min/{1.73_m2} (ref >59)
GFR, EST NON AFRICAN AMERICAN: 111 mL/min/{1.73_m2} (ref >59)
GLUCOSE: 89 mg/dL (ref 65–99)
POTASSIUM: 4.4 mmol/L (ref 3.5–5.2)
SODIUM: 143 mmol/L (ref 134–144)

## 2017-05-29 LAB — CBC WITH DIFFERENTIAL/PLATELET
BASOS ABS: 0 10*3/uL (ref 0.0–0.2)
Basos: 0 %
EOS (ABSOLUTE): 0.2 10*3/uL (ref 0.0–0.4)
EOS: 3 %
HEMATOCRIT: 41.8 % (ref 37.5–51.0)
HEMOGLOBIN: 14.9 g/dL (ref 13.0–17.7)
IMMATURE GRANULOCYTES: 0 %
Immature Grans (Abs): 0 10*3/uL (ref 0.0–0.1)
LYMPHS ABS: 1.8 10*3/uL (ref 0.7–3.1)
Lymphs: 25 %
MCH: 31.9 pg (ref 26.6–33.0)
MCHC: 35.6 g/dL (ref 31.5–35.7)
MCV: 90 fL (ref 79–97)
MONOCYTES: 7 %
Monocytes Absolute: 0.5 10*3/uL (ref 0.1–0.9)
NEUTROS PCT: 65 %
Neutrophils Absolute: 4.6 10*3/uL (ref 1.4–7.0)
Platelets: 228 10*3/uL (ref 150–379)
RBC: 4.67 x10E6/uL (ref 4.14–5.80)
RDW: 13.4 % (ref 12.3–15.4)
WBC: 7.1 10*3/uL (ref 3.4–10.8)

## 2017-05-29 LAB — MAGNESIUM: MAGNESIUM: 2.1 mg/dL (ref 1.6–2.3)

## 2017-05-31 ENCOUNTER — Other Ambulatory Visit: Payer: Self-pay | Admitting: Internal Medicine

## 2017-05-31 DIAGNOSIS — I482 Chronic atrial fibrillation, unspecified: Secondary | ICD-10-CM

## 2017-06-14 ENCOUNTER — Other Ambulatory Visit (HOSPITAL_COMMUNITY): Payer: Self-pay | Admitting: Cardiology

## 2017-08-02 ENCOUNTER — Ambulatory Visit (INDEPENDENT_AMBULATORY_CARE_PROVIDER_SITE_OTHER): Payer: 59 | Admitting: *Deleted

## 2017-08-02 DIAGNOSIS — I428 Other cardiomyopathies: Secondary | ICD-10-CM | POA: Diagnosis not present

## 2017-08-02 NOTE — Progress Notes (Signed)
Remote ICD transmission.   

## 2017-08-07 ENCOUNTER — Encounter: Payer: Self-pay | Admitting: Cardiology

## 2017-08-28 LAB — CUP PACEART REMOTE DEVICE CHECK
Battery Remaining Longevity: 21 mo
Brady Statistic AP VS Percent: 0.78 %
Brady Statistic AS VS Percent: 0.09 %
Brady Statistic RA Percent Paced: 99.69 %
Brady Statistic RV Percent Paced: 98.04 %
Date Time Interrogation Session: 20190509083623
HighPow Impedance: 67 Ohm
Implantable Lead Implant Date: 20160628
Implantable Lead Location: 753859
Implantable Lead Location: 753860
Implantable Lead Model: 5076
Implantable Pulse Generator Implant Date: 20160628
Lead Channel Impedance Value: 247 Ohm
Lead Channel Impedance Value: 361 Ohm
Lead Channel Impedance Value: 399 Ohm
Lead Channel Pacing Threshold Amplitude: 0.875 V
Lead Channel Sensing Intrinsic Amplitude: 2.25 mV
Lead Channel Sensing Intrinsic Amplitude: 9.375 mV
Lead Channel Sensing Intrinsic Amplitude: 9.375 mV
Lead Channel Setting Pacing Amplitude: 0.5 V
Lead Channel Setting Pacing Pulse Width: 0.03 ms
Lead Channel Setting Sensing Sensitivity: 0.3 mV
MDC IDC LEAD IMPLANT DT: 20160628
MDC IDC LEAD IMPLANT DT: 20160628
MDC IDC LEAD LOCATION: 753858
MDC IDC MSMT BATTERY VOLTAGE: 2.93 V
MDC IDC MSMT LEADCHNL LV IMPEDANCE VALUE: 285 Ohm
MDC IDC MSMT LEADCHNL LV IMPEDANCE VALUE: 456 Ohm
MDC IDC MSMT LEADCHNL LV PACING THRESHOLD PULSEWIDTH: 0.8 ms
MDC IDC MSMT LEADCHNL RA IMPEDANCE VALUE: 418 Ohm
MDC IDC MSMT LEADCHNL RA SENSING INTR AMPL: 2.25 mV
MDC IDC MSMT LEADCHNL RV PACING THRESHOLD AMPLITUDE: 0.625 V
MDC IDC MSMT LEADCHNL RV PACING THRESHOLD PULSEWIDTH: 0.4 ms
MDC IDC SET LEADCHNL LV PACING AMPLITUDE: 2.5 V
MDC IDC SET LEADCHNL LV PACING PULSEWIDTH: 0.8 ms
MDC IDC SET LEADCHNL RA PACING AMPLITUDE: 2 V
MDC IDC STAT BRADY AP VP PERCENT: 99.01 %
MDC IDC STAT BRADY AS VP PERCENT: 0.12 %

## 2017-10-13 ENCOUNTER — Other Ambulatory Visit: Payer: Self-pay | Admitting: Internal Medicine

## 2017-10-14 ENCOUNTER — Other Ambulatory Visit (HOSPITAL_COMMUNITY): Payer: Self-pay | Admitting: Internal Medicine

## 2017-10-28 IMAGING — RF DG ESOPHAGUS
5 series · 14 of 18 positions shown · non-contrast
Comparison: None.

CLINICAL DATA: Dysphasia.

EXAM:
ESOPHOGRAM / BARIUM SWALLOW / BARIUM TABLET STUDY
TECHNIQUE: Combined double contrast and single contrast examination performed
using effervescent crystals, thick barium liquid, and thin barium
liquid. The patient was observed with fluoroscopy swallowing a 13 mm
barium sulphate tablet.
FLUOROSCOPY TIME:  Fluoroscopy Time:  2 minutes and 6 seconds
Radiation Exposure Index (if provided by the fluoroscopic device):
287 mGy a
Number of Acquired Spot Images: 0

[Series 1: sequence · 3 of 16 frames shown (1 of 4)]
[frame 3/16]
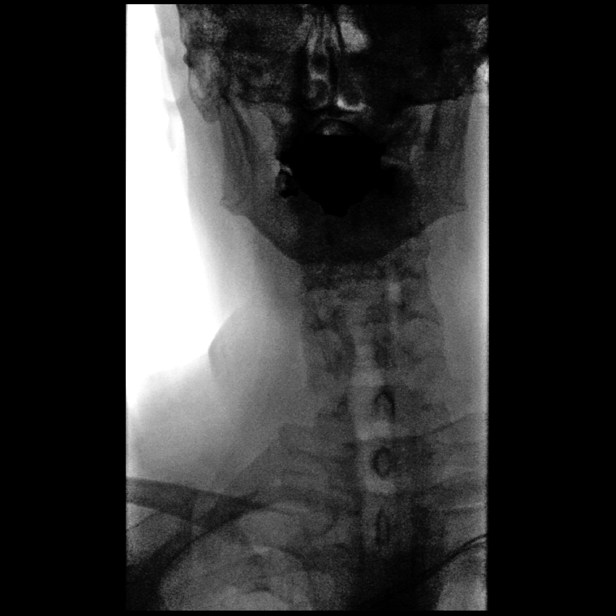
[frame 5/16]
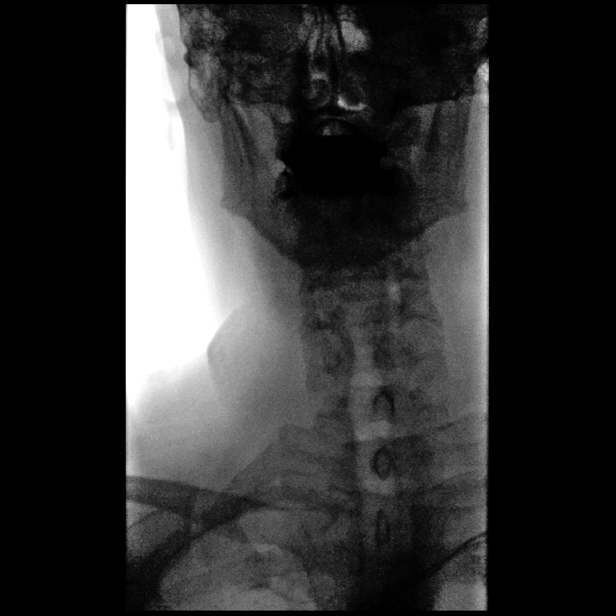
[frame 14/16]
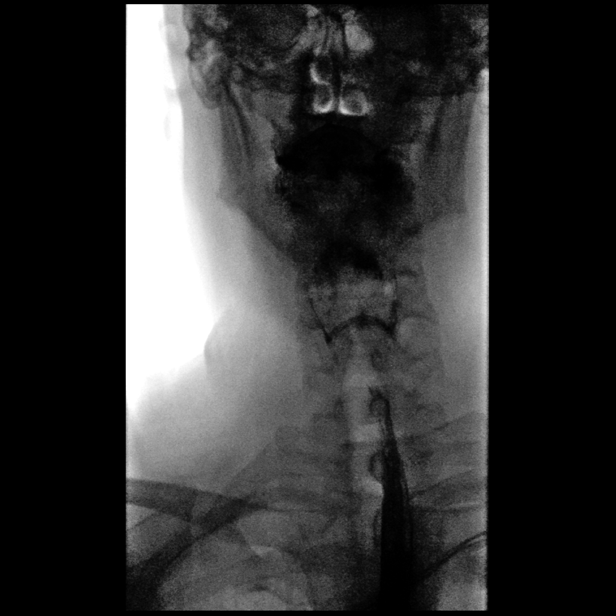

[Series 2: sequence · 2 of 15 frames shown (2 of 4)]
[frame 3/15]
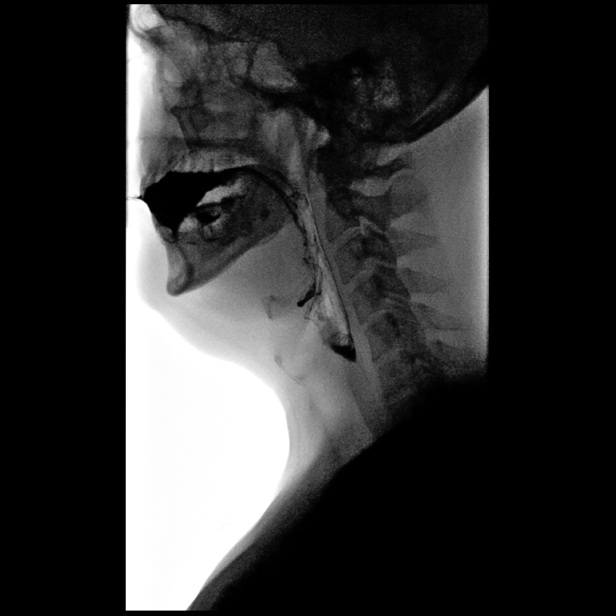
[frame 8/15]
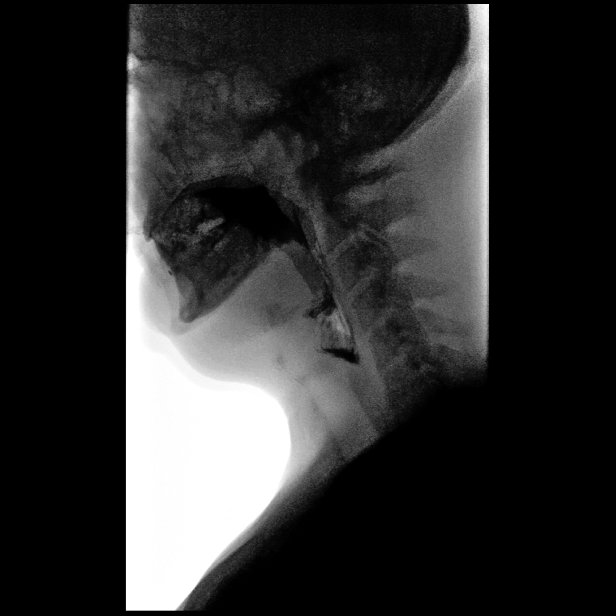

[Series 3: sequence · 4 of 36 frames shown (3 of 4)]
[frame 6/36]
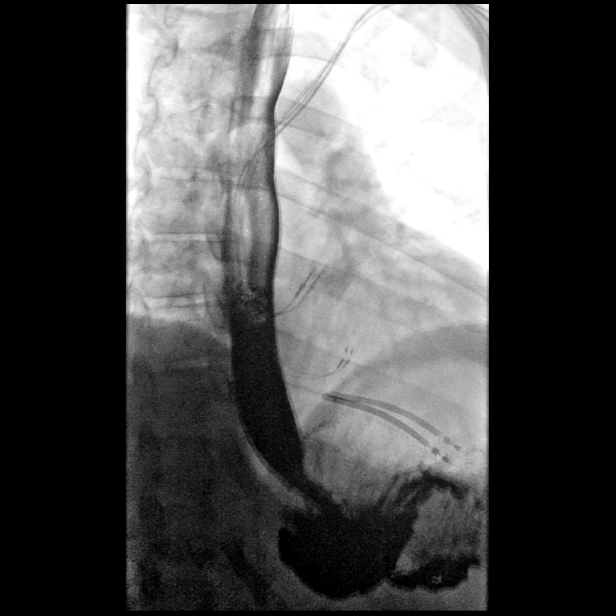
[frame 19/36]
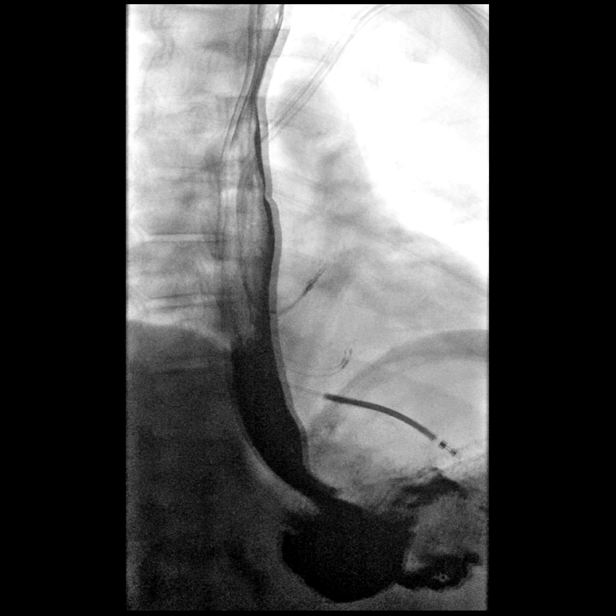
[frame 24/36]
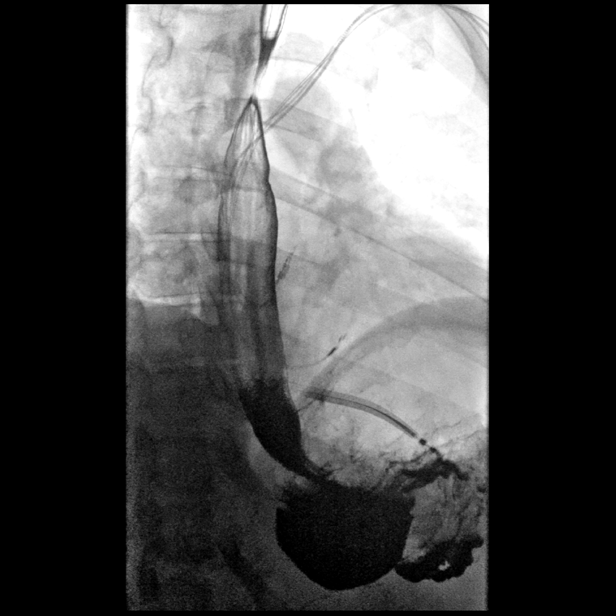
[frame 31/36]
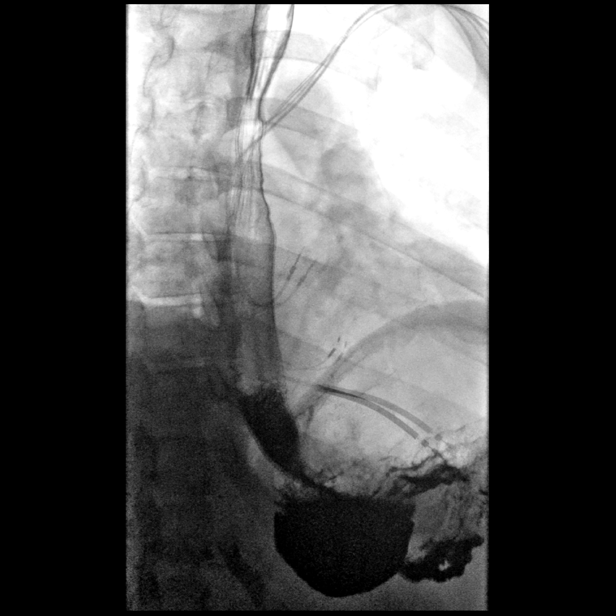

[Series 4: sequence · 3 of 38 frames shown (4 of 4)]
[frame 20/38]
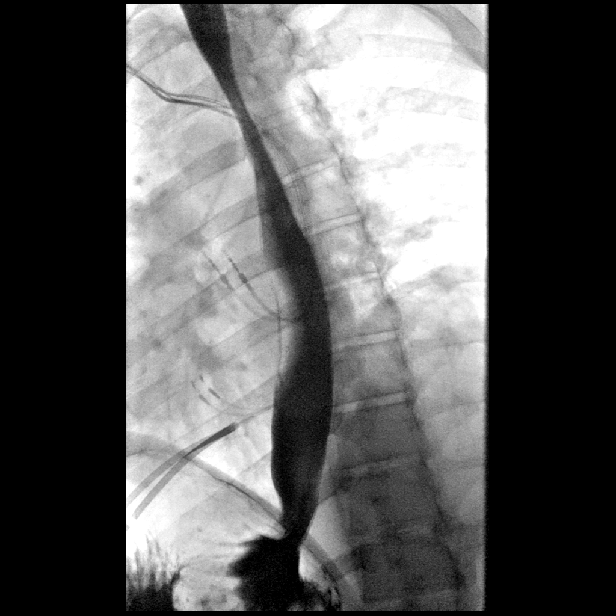
[frame 30/38]
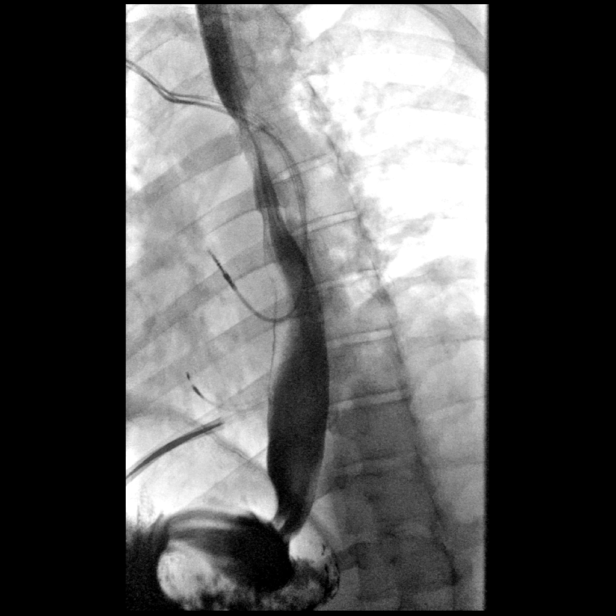
[frame 33/38]
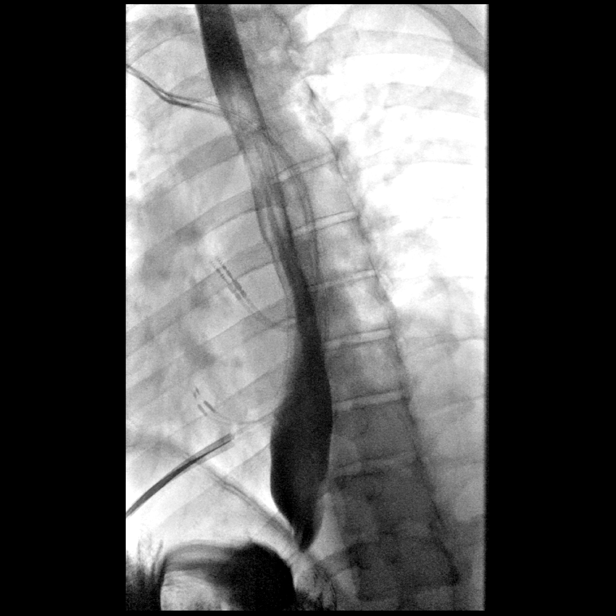

[Series 5: one shot · 2 of 3 slices shown]
[im 2/3]
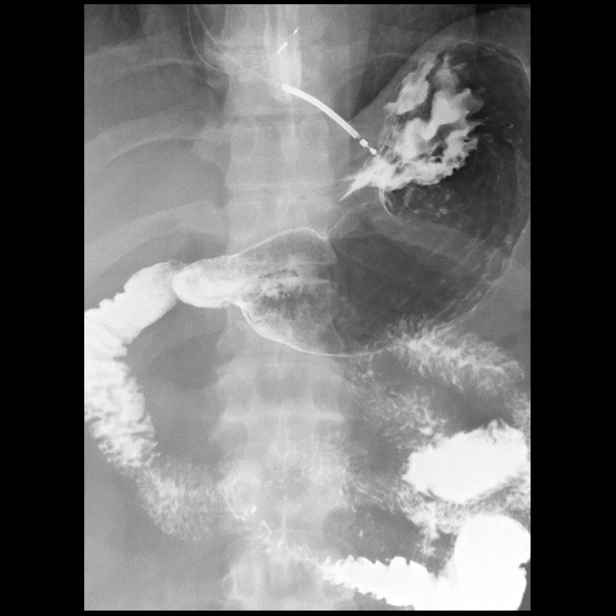
[im 3/3]
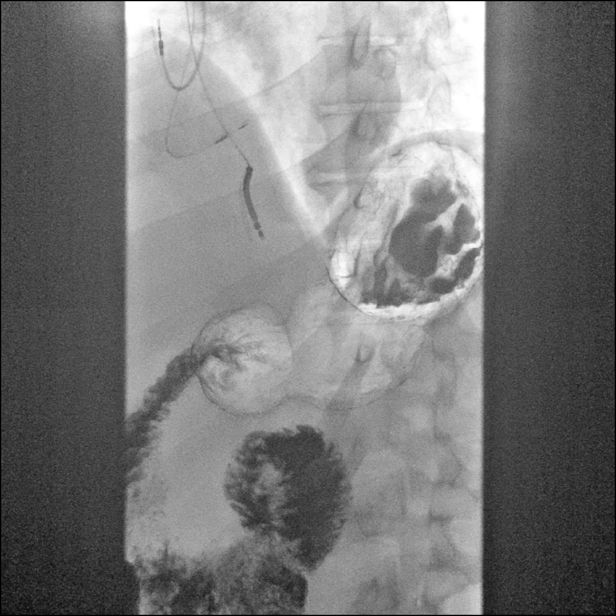

[14 of 18 positions shown; findings below may reference images not displayed]

FINDINGS: Initial barium swallows demonstrate normal pharyngeal motion with
swallowing. No laryngeal penetration or aspiration. No upper
esophageal webs, strictures or diverticuli.

Normal esophageal motility. No intrinsic or extrinsic lesions of the
esophagus were identified. No mucosal abnormalities. No hiatal
hernia or GE reflux. The 13 mm barium pill passed into the stomach
without difficulty.

The stomach, duodenum bulb and C-loop appear normal.
IMPRESSION: Unremarkable barium esophagram.

## 2017-11-01 ENCOUNTER — Ambulatory Visit (INDEPENDENT_AMBULATORY_CARE_PROVIDER_SITE_OTHER): Payer: 59 | Admitting: *Deleted

## 2017-11-01 DIAGNOSIS — I495 Sick sinus syndrome: Secondary | ICD-10-CM

## 2017-11-01 DIAGNOSIS — I428 Other cardiomyopathies: Secondary | ICD-10-CM | POA: Diagnosis not present

## 2017-11-02 NOTE — Progress Notes (Signed)
Remote ICD transmission.   

## 2017-11-21 LAB — CUP PACEART REMOTE DEVICE CHECK
Brady Statistic AP VS Percent: 3.21 %
Brady Statistic AS VP Percent: 0.48 %
Brady Statistic AS VS Percent: 0.23 %
Date Time Interrogation Session: 20190808041603
HIGH POWER IMPEDANCE MEASURED VALUE: 78 Ohm
Implantable Lead Implant Date: 20160628
Implantable Lead Implant Date: 20160628
Implantable Lead Location: 753860
Implantable Lead Model: 5076
Implantable Pulse Generator Implant Date: 20160628
Lead Channel Impedance Value: 247 Ohm
Lead Channel Impedance Value: 304 Ohm
Lead Channel Impedance Value: 418 Ohm
Lead Channel Impedance Value: 513 Ohm
Lead Channel Pacing Threshold Amplitude: 0.875 V
Lead Channel Pacing Threshold Pulse Width: 0.4 ms
Lead Channel Pacing Threshold Pulse Width: 0.8 ms
Lead Channel Sensing Intrinsic Amplitude: 1.875 mV
Lead Channel Sensing Intrinsic Amplitude: 1.875 mV
Lead Channel Sensing Intrinsic Amplitude: 3.75 mV
Lead Channel Setting Pacing Amplitude: 2 V
Lead Channel Setting Pacing Amplitude: 2.5 V
Lead Channel Setting Pacing Pulse Width: 0.03 ms
MDC IDC LEAD IMPLANT DT: 20160628
MDC IDC LEAD LOCATION: 753858
MDC IDC LEAD LOCATION: 753859
MDC IDC MSMT BATTERY REMAINING LONGEVITY: 20 mo
MDC IDC MSMT BATTERY VOLTAGE: 2.92 V
MDC IDC MSMT LEADCHNL LV IMPEDANCE VALUE: 475 Ohm
MDC IDC MSMT LEADCHNL RA IMPEDANCE VALUE: 532 Ohm
MDC IDC MSMT LEADCHNL RV PACING THRESHOLD AMPLITUDE: 0.625 V
MDC IDC MSMT LEADCHNL RV SENSING INTR AMPL: 3.75 mV
MDC IDC SET LEADCHNL LV PACING PULSEWIDTH: 0.8 ms
MDC IDC SET LEADCHNL RV PACING AMPLITUDE: 0.5 V
MDC IDC SET LEADCHNL RV SENSING SENSITIVITY: 0.3 mV
MDC IDC STAT BRADY AP VP PERCENT: 96.07 %
MDC IDC STAT BRADY RA PERCENT PACED: 99.1 %
MDC IDC STAT BRADY RV PERCENT PACED: 95.52 %

## 2017-11-28 ENCOUNTER — Other Ambulatory Visit: Payer: Self-pay | Admitting: Internal Medicine

## 2017-11-28 DIAGNOSIS — I482 Chronic atrial fibrillation, unspecified: Secondary | ICD-10-CM

## 2018-01-31 ENCOUNTER — Telehealth: Payer: Self-pay

## 2018-01-31 ENCOUNTER — Ambulatory Visit (INDEPENDENT_AMBULATORY_CARE_PROVIDER_SITE_OTHER): Payer: 59 | Admitting: *Deleted

## 2018-01-31 DIAGNOSIS — I428 Other cardiomyopathies: Secondary | ICD-10-CM

## 2018-01-31 NOTE — Telephone Encounter (Signed)
Spoke with pt and reminded pt of remote transmission that is due today. Pt verbalized understanding.   

## 2018-02-01 LAB — CUP PACEART REMOTE DEVICE CHECK
Battery Remaining Longevity: 19 mo
Battery Voltage: 2.91 V
Brady Statistic AP VP Percent: 98.5 %
Brady Statistic AS VP Percent: 0.38 %
Brady Statistic AS VS Percent: 0.12 %
Brady Statistic RV Percent Paced: 97.1 %
HighPow Impedance: 69 Ohm
Implantable Lead Implant Date: 20160628
Implantable Lead Implant Date: 20160628
Implantable Lead Location: 753858
Implantable Lead Location: 753860
Implantable Lead Model: 5076
Lead Channel Impedance Value: 247 Ohm
Lead Channel Impedance Value: 304 Ohm
Lead Channel Impedance Value: 361 Ohm
Lead Channel Impedance Value: 418 Ohm
Lead Channel Impedance Value: 456 Ohm
Lead Channel Impedance Value: 475 Ohm
Lead Channel Pacing Threshold Amplitude: 0.875 V
Lead Channel Pacing Threshold Pulse Width: 0.8 ms
Lead Channel Sensing Intrinsic Amplitude: 1.875 mV
Lead Channel Sensing Intrinsic Amplitude: 1.875 mV
Lead Channel Sensing Intrinsic Amplitude: 7.125 mV
Lead Channel Setting Pacing Amplitude: 0.5 V
Lead Channel Setting Pacing Amplitude: 2 V
Lead Channel Setting Pacing Pulse Width: 0.03 ms
Lead Channel Setting Sensing Sensitivity: 0.3 mV
MDC IDC LEAD IMPLANT DT: 20160628
MDC IDC LEAD LOCATION: 753859
MDC IDC MSMT LEADCHNL RV PACING THRESHOLD AMPLITUDE: 0.625 V
MDC IDC MSMT LEADCHNL RV PACING THRESHOLD PULSEWIDTH: 0.4 ms
MDC IDC MSMT LEADCHNL RV SENSING INTR AMPL: 7.125 mV
MDC IDC PG IMPLANT DT: 20160628
MDC IDC SESS DTM: 20191108153727
MDC IDC SET LEADCHNL LV PACING AMPLITUDE: 2.5 V
MDC IDC SET LEADCHNL LV PACING PULSEWIDTH: 0.8 ms
MDC IDC STAT BRADY AP VS PERCENT: 1 %
MDC IDC STAT BRADY RA PERCENT PACED: 99.33 %

## 2018-02-03 ENCOUNTER — Encounter: Payer: Self-pay | Admitting: Cardiology

## 2018-02-03 NOTE — Progress Notes (Signed)
Remote ICD transmission.   

## 2018-02-25 ENCOUNTER — Encounter: Payer: Self-pay | Admitting: Internal Medicine

## 2018-02-25 ENCOUNTER — Ambulatory Visit (INDEPENDENT_AMBULATORY_CARE_PROVIDER_SITE_OTHER): Payer: 59 | Admitting: Internal Medicine

## 2018-02-25 VITALS — BP 136/84 | HR 82 | Ht 75.0 in | Wt 269.8 lb

## 2018-02-25 DIAGNOSIS — Z79899 Other long term (current) drug therapy: Secondary | ICD-10-CM | POA: Diagnosis not present

## 2018-02-25 DIAGNOSIS — I428 Other cardiomyopathies: Secondary | ICD-10-CM

## 2018-02-25 DIAGNOSIS — I4819 Other persistent atrial fibrillation: Secondary | ICD-10-CM

## 2018-02-25 LAB — CUP PACEART INCLINIC DEVICE CHECK
Battery Remaining Longevity: 18 mo
Battery Voltage: 2.91 V
Brady Statistic AP VP Percent: 97.88 %
Brady Statistic RA Percent Paced: 99.36 %
Brady Statistic RV Percent Paced: 96.87 %
HIGH POWER IMPEDANCE MEASURED VALUE: 70 Ohm
Implantable Lead Implant Date: 20160628
Implantable Lead Implant Date: 20160628
Implantable Lead Implant Date: 20160628
Implantable Lead Location: 753858
Implantable Lead Location: 753859
Implantable Lead Model: 3830
Implantable Pulse Generator Implant Date: 20160628
Lead Channel Impedance Value: 418 Ohm
Lead Channel Impedance Value: 475 Ohm
Lead Channel Impedance Value: 475 Ohm
Lead Channel Pacing Threshold Amplitude: 0.75 V
Lead Channel Pacing Threshold Pulse Width: 0.4 ms
Lead Channel Pacing Threshold Pulse Width: 0.8 ms
Lead Channel Sensing Intrinsic Amplitude: 9.875 mV
Lead Channel Setting Pacing Amplitude: 0.5 V
Lead Channel Setting Pacing Amplitude: 2 V
Lead Channel Setting Pacing Pulse Width: 0.8 ms
MDC IDC LEAD LOCATION: 753860
MDC IDC MSMT LEADCHNL LV IMPEDANCE VALUE: 285 Ohm
MDC IDC MSMT LEADCHNL LV IMPEDANCE VALUE: 304 Ohm
MDC IDC MSMT LEADCHNL LV PACING THRESHOLD AMPLITUDE: 1 V
MDC IDC MSMT LEADCHNL RA PACING THRESHOLD AMPLITUDE: 0.75 V
MDC IDC MSMT LEADCHNL RA PACING THRESHOLD PULSEWIDTH: 0.4 ms
MDC IDC MSMT LEADCHNL RA SENSING INTR AMPL: 2.75 mV
MDC IDC MSMT LEADCHNL RV IMPEDANCE VALUE: 361 Ohm
MDC IDC SESS DTM: 20191202181420
MDC IDC SET LEADCHNL LV PACING AMPLITUDE: 2.5 V
MDC IDC SET LEADCHNL RV PACING PULSEWIDTH: 0.03 ms
MDC IDC SET LEADCHNL RV SENSING SENSITIVITY: 0.3 mV
MDC IDC STAT BRADY AP VS PERCENT: 1.63 %
MDC IDC STAT BRADY AS VP PERCENT: 0.34 %
MDC IDC STAT BRADY AS VS PERCENT: 0.15 %

## 2018-02-25 NOTE — Progress Notes (Signed)
Electrophysiology Office Note Date: 02/25/2018  ID:  Donald, Grant Jun 25, 1975, MRN [id number]  PCP: Mosetta Putt, MD Primary Cardiologist: Dr Shirlee Latch Electrophysiologist: Dr Johney Frame  CC: Follow up for atrial fibrillation and device check  Donald Grant is a 42 y.o. male seen today for  routine electrophysiology followup.  Since last being seen in our clinic, the patient reports doing very well. His Afib burden on device interrogation is <1%. He has remained fairly asymptomatic. No shocks from his device. No bleeding issues on anticoagulation.  He denies chest pain, palpitations, dyspnea, PND, orthopnea, nausea, vomiting, dizziness, syncope, edema, weight gain, or early satiety.  Past Medical History:  Diagnosis Date  . Anxiety   . Atrial fibrillation (HCC)    PVI ablatioin at [facility] 04/21/14 and May 2016 Dr. [name]  . Atrial flutter [facility])    CTI ablation by Dr [name] 02/24/11  . Atrial tachycardia (HCC)    mapped to AV node by Dr [name], ablation not performed  . Cardiomyopathy, nonischemic (HCC)    Medtronic BIVE ICD   . Dyslipidemia   . First degree AV block   . GERD (gastroesophageal reflux disease)   . Gout    bilateral feet  . Hx of cardiovascular stress test    Lexiscan Myoview (07/2013):  Inf and inferolateral scar, no ischemia, no gated  . Lymphadenopathy of right cervical region ?Mono 04/16/2013  . Premature ventricular contraction    Past Surgical History:  Procedure Laterality Date  . ABLATION OF DYSRHYTHMIC FOCUS  07/13/15   Atypical atrial flutter ablation at [facility] by Dr [name]  . ATRIAL ABLATION SURGERY  02/24/11   CTI ablation by Dr [name]  . ATRIAL FIBRILLATION ABLATION  04/24/14, 08/13/14   PVI at [facility] by Dr [name], PVI with posterior wall Box and FIRM ablation performed  . ATRIAL FLUTTER ABLATION N/A 02/24/2011   Procedure: ATRIAL FLUTTER ABLATION;  Surgeon: [name], MD;  Location[facility] CATH LAB;  Service: Cardiovascular;  Laterality: N/A;    . CARDIAC DEFIBRILLATOR PLACEMENT    . CARDIOVERSION  03/29/2011   Procedure: CARDIOVERSION;  Surgeon: [name], MD;  Location: [facility] OR;  Service: Cardiovascular;  Laterality: N/A;  . CARDIOVERSION N/A 02/25/2015   Procedure: CARDIOVERSION;  Surgeon: [name], MD;  Location: [facility] ENDOSCOPY;  Service: Cardiovascular;  Laterality: N/A;  . CARDIOVERSION (BEDSIDE)  03/29/2011      . ELECTROPHYSIOLOGIC STUDY N/A 06/21/2015   Procedure: Cardioversion;  Surgeon: [name], MD;  Location: [facility] INVASIVE CV LAB;  Service: Cardiovascular;  Laterality: N/A;  . EVALUATION UNDER ANESTHESIA WITH HEMORRHOIDECTOMY N/A 02/05/2017   Procedure: EXAM UNDER ANESTHESIA;  Surgeon: Karie Soda, MD;  Location: [facility] ORS;  Service: General;  Laterality: N/A;  . EXAMINATION UNDER ANESTHESIA  12/19/12   Anal Fistula  . FISTULOTOMY N/A 02/05/2017   Procedure: REPAIR PERIRECTAL FISTULA ERAS PATHWAY;  Surgeon: Karie Soda, MD;  Location: [facility] ORS;  Service: General;  Laterality: N/A;  . MOUTH SURGERY    . PACEMAKER IMPLANT    . ROOT CANAL      Current Outpatient Medications  Medication Sig Dispense Refill  . acyclovir (ZOVIRAX) 400 MG tablet Take 400 mg by mouth 2 (two) times daily as needed (outbreak).     [name] allopurinol (ZYLOPRIM) 300 MG tablet Take 300 mg by mouth every morning.     [name] ALPRAZolam (XANAX) 0.5 MG tablet Take 0.25 tablets by mouth daily as needed for anxiety.   1  .  AMBULATORY NON FORMULARY MEDICATION Place 1 application 4 (four) times daily rectally. DILTIAZEM 2% compounded suspension.  (Get Rx filled at a Set designer, such as [facility] or [facility] in [place], [place]) 1 Tube 2  . atorvastatin (LIPITOR) 20 MG tablet Take 20 mg by mouth every evening.     . calcium carbonate (TUMS - DOSED IN MG ELEMENTAL CALCIUM) 500 MG chewable tablet Chew 2-3 tablets by mouth daily as needed for indigestion or heartburn.    . carvedilol (COREG) 6.25 MG tablet TAKE 1 TABLET BY MOUTH  2 TIMES DAILY 180 tablet 1  . Cholecalciferol (VITAMIN D) 2000 units CAPS Take 4,000 Units by mouth daily.    [name] dofetilide (TIKOSYN) 500 MCG capsule TAKE 1 CAPSULE BY MOUTH 2 TIMES DAILY *MAKE AN APPT W/ DR. Blakeleigh Domek FOR DECEMBER FOR REFILLS* 180 capsule 3  . ENTRESTO 49-51 MG TAKE 1 TABLET BY MOUTH TWICE A DAY 180 tablet 3  . fish oil-omega-3 fatty acids 1000 MG capsule Take 2 g by mouth every evening.     . indomethacin (INDOCIN) 25 MG capsule Take 25 mg by mouth 3 (three) times daily as needed (gout).    . magnesium oxide (MAG-OX) 400 (241.3 Mg) MG tablet Take 400 mg by mouth every morning.   11  . Multiple Vitamin (MULITIVITAMIN WITH MINERALS) TABS Take 1 tablet by mouth every evening.     [name] oxyCODONE (OXY IR/ROXICODONE) 5 MG immediate release tablet Take 1-2 tablets (5-10 mg total) every 4 (four) hours as needed by mouth for moderate pain, severe pain or breakthrough pain. 40 tablet 0  . PRADAXA 150 MG CAPS capsule TAKE ONE CAPSULE BY MOUTH TWICE A DAY 180 capsule 1  . Psyllium (METAMUCIL PO) Take by mouth. Use 2 teaspoons into water once daily    . spironolactone (ALDACTONE) 25 MG tablet TAKE 1/2 TABLET BY MOUTH ONCE DAILY 15 tablet 3  . spironolactone (ALDACTONE) 25 MG tablet TAKE 1/2 TABLET BY MOUTH ONCE DAILY 45 tablet 1  . vitamin B-12 (CYANOCOBALAMIN) 1000 MCG tablet Take 1,000 mcg by mouth at bedtime.     No current facility-administered medications for this visit.     Allergies:   Patient has no known allergies.   Social History: Social History   Socioeconomic History  . Marital status: Married    Spouse name: Not on file  . Number of children: Not on file  . Years of education: Not on file  . Highest education level: Not on file  Occupational History  . Not on file  Social Needs  . Financial resource strain: Not on file  . Food insecurity:    Worry: Not on file    Inability: Not on file  . Transportation needs:    Medical: Not on file    Non-medical: Not on file   Tobacco Use  . Smoking status: Former Smoker    Last attempt to quit: 08/28/2010    Years since quitting: 7.5  . Smokeless tobacco: Never Used  Substance and Sexual Activity  . Alcohol use: Yes    Comment: 3-4 times weekly  . Drug use: Yes    Types: Marijuana    Comment: still currently smoking, marijuana twice monthly last time 01/27/17  . Sexual activity: Yes  Lifestyle  . Physical activity:    Days per week: Not on file    Minutes per session: Not on file  . Stress: Not on file  Relationships  . Social connections:  Talks on phone: Not on file    Gets together: Not on file    Attends religious service: Not on file    Active member of club or organization: Not on file    Attends meetings of clubs or organizations: Not on file    Relationship status: Not on file  . Intimate partner violence:    Fear of current or ex partner: Not on file    Emotionally abused: Not on file    Physically abused: Not on file    Forced sexual activity: Not on file  Other Topics Concern  . Not on file  Social History Narrative   His additional social history is notable that the patient exercises     several times a week, jogging up to 2 miles a day.  With exercise, he     notes that he has lost over 10 pounds.     Family History: Family History  Problem Relation Age of Onset  . Heart disease Mother   . Achondroplasia Mother   . Alcohol abuse Mother   . Sudden death Mother   . Dilated cardiomyopathy Mother   . Sudden death Maternal Grandmother   . Achondroplasia Son   . Sudden death Maternal Aunt   . Other Maternal Aunt        substance abuse    Review of Systems: All other systems reviewed and are otherwise negative except as noted above.   Physical Exam: VS:  BP 136/84   Pulse 82   Ht 6\' 3"  (1.905 m)   Wt 269 lb 12.8 oz (122.4 kg)   SpO2 98%   BMI 33.72 kg/m  , BMI Body mass index is 33.72 kg/m. Wt Readings from Last 3 Encounters:  02/25/18 269 lb 12.8 oz (122.4 kg)   05/28/17 261 lb (118.4 kg)  02/05/17 259 lb (117.5 kg)    GEN- The patient is well appearing, alert and oriented x 3 today.   HEENT: normocephalic, atraumatic; sclera clear, conjunctiva pink; hearing intact; oropharynx clear; neck supple, no JVP Lungs- Clear to ausculation bilaterally, normal work of breathing.  No wheezes, rales, rhonchi Heart- Regular rate and rhythm, no murmurs, rubs or gallops, PMI not laterally displaced Extremities- no clubbing, cyanosis, or edema MS- no significant deformity or atrophy Skin- warm and dry, no rash or lesion  Psych- euthymic mood, full affect Neuro- strength and sensation are intact   EKG:  EKG is ordered today. The ekg ordered today shows sinus rhythm with V-pacing. PR 216, QRS 136, QTc 476  Recent Labs: 05/28/2017: BUN 13; Creatinine, Ser 0.78; Hemoglobin 14.9; Magnesium 2.1; Platelets 228; Potassium 4.4; Sodium 143    Other studies Reviewed: Additional studies/ records that were reviewed today include: ICD interrogation reviewed in detail. See PACEART report.  Assessment and Plan:  1. Persistent atrial fibrillation/flutter S/p ablations at [facility] Doing well on Tikosyn. QTc stable. CHADS2VASC score of 1. Continue Pradaxa 150mg  BID Due for Bmet/Magnesium  2. Chronic HFrEF Euvolemic today, stable Normal ICD function No changes today  3. Advanced AV block S/p BiV ICD with LV as His lead rather than CS lead  4. ETOH abuse Counseling about cessation Admits he still drinks   Current medicines are reviewed at length with the patient today.   The patient does not have concerns regarding his medicines.  The following changes were made today: none  Labs/ tests ordered today include: Orders Placed This Encounter  Procedures  . Basic metabolic panel  . Magnesium  .  EKG 12-Lead     Disposition:   Follow up with Dr Shirlee Latch in 3 months and me in 6 months. Carelink monitoring.  Randolm Idol MD 02/25/2018 4:34 PM   [facility] [address] [place] [address] [phone] (office) [phone] (fax)

## 2018-02-25 NOTE — Patient Instructions (Addendum)
Medication Instructions:  Your physician recommends that you continue on your current medications as directed. Please refer to the Current Medication list given to you today.  *If you need a refill on your cardiac medications before your next appointment, please call your pharmacy*  Labwork: Today: Sotalol surveillance lab work:  BMET & Magnesium level  Testing/Procedures: None ordered  Follow-Up: Your physician recommends that you schedule a follow-up appointment in: 3 months with Dr. Shirlee Latch.  Remote monitoring is used to monitor your  ICD from home. This monitoring reduces the number of office visits required to check your device to one time per year. It allows [place] to keep an eye on the functioning of your device to ensure it is working properly. You are scheduled for a device check from home on 05/02/2018. You may send your transmission at any time that day. If you have a wireless device, the transmission will be sent automatically. After your physician reviews your transmission, you will receive a postcard with your next transmission date.  Your physician wants you to follow-up in: 6 months with Dr. Johney Frame.  You will receive a reminder letter in the mail two months in advance. If you don't receive a letter, please call our office to schedule the follow-up appointment.  Thank you for choosing [facility]!!

## 2018-02-26 LAB — BASIC METABOLIC PANEL
BUN/Creatinine Ratio: 14 (ref 9–20)
BUN: 10 mg/dL (ref 6–24)
CALCIUM: 9.5 mg/dL (ref 8.7–10.2)
CHLORIDE: 101 mmol/L (ref 96–106)
CO2: 22 mmol/L (ref 20–29)
Creatinine, Ser: 0.73 mg/dL — ABNORMAL LOW (ref 0.76–1.27)
GFR calc non Af Amer: 114 mL/min/{1.73_m2} (ref >59)
GFR, EST AFRICAN AMERICAN: 131 mL/min/{1.73_m2} (ref >59)
Glucose: 104 mg/dL — ABNORMAL HIGH (ref 65–99)
POTASSIUM: 4.1 mmol/L (ref 3.5–5.2)
Sodium: 141 mmol/L (ref 134–144)

## 2018-02-26 LAB — MAGNESIUM: MAGNESIUM: 1.9 mg/dL (ref 1.6–2.3)

## 2018-04-04 ENCOUNTER — Other Ambulatory Visit (HOSPITAL_COMMUNITY): Payer: Self-pay | Admitting: Internal Medicine

## 2018-04-08 ENCOUNTER — Other Ambulatory Visit: Payer: Self-pay | Admitting: Internal Medicine

## 2018-04-08 NOTE — Telephone Encounter (Signed)
Pt last saw Dr Johney Frame 02/25/18, last labs 02/25/18 Creat 0.73, age 43, weight 122.4kg, CrCl 225.89, based on CrCl pt is on appropriate dosage of Pradaxa 150mg  BID.  Will refill rx.

## 2018-04-14 ENCOUNTER — Other Ambulatory Visit: Payer: Self-pay | Admitting: Internal Medicine

## 2018-05-02 ENCOUNTER — Ambulatory Visit (INDEPENDENT_AMBULATORY_CARE_PROVIDER_SITE_OTHER): Payer: 59

## 2018-05-02 DIAGNOSIS — I428 Other cardiomyopathies: Secondary | ICD-10-CM | POA: Diagnosis not present

## 2018-05-02 DIAGNOSIS — I495 Sick sinus syndrome: Secondary | ICD-10-CM

## 2018-05-03 LAB — CUP PACEART REMOTE DEVICE CHECK
Battery Remaining Longevity: 16 mo
Battery Voltage: 2.9 V
Brady Statistic AP VP Percent: 99.38 %
Brady Statistic AP VS Percent: 0.18 %
Brady Statistic AS VP Percent: 0.33 %
Brady Statistic AS VS Percent: 0.11 %
Brady Statistic RA Percent Paced: 99.42 %
Brady Statistic RV Percent Paced: 98.92 %
Date Time Interrogation Session: 20200206072602
HighPow Impedance: 74 Ohm
Implantable Lead Implant Date: 20160628
Implantable Lead Implant Date: 20160628
Implantable Lead Implant Date: 20160628
Implantable Lead Location: 753858
Implantable Lead Location: 753859
Implantable Lead Location: 753860
Implantable Lead Model: 3830
Implantable Pulse Generator Implant Date: 20160628
Lead Channel Impedance Value: 285 Ohm
Lead Channel Impedance Value: 399 Ohm
Lead Channel Impedance Value: 475 Ohm
Lead Channel Impedance Value: 513 Ohm
Lead Channel Impedance Value: 513 Ohm
Lead Channel Pacing Threshold Amplitude: 0.625 V
Lead Channel Pacing Threshold Amplitude: 0.875 V
Lead Channel Pacing Threshold Pulse Width: 0.4 ms
Lead Channel Pacing Threshold Pulse Width: 0.8 ms
Lead Channel Sensing Intrinsic Amplitude: 1 mV
Lead Channel Sensing Intrinsic Amplitude: 1 mV
Lead Channel Sensing Intrinsic Amplitude: 2.375 mV
Lead Channel Setting Pacing Amplitude: 0.5 V
Lead Channel Setting Pacing Amplitude: 2 V
Lead Channel Setting Pacing Amplitude: 2.5 V
Lead Channel Setting Pacing Pulse Width: 0.03 ms
Lead Channel Setting Pacing Pulse Width: 0.8 ms
Lead Channel Setting Sensing Sensitivity: 0.3 mV
MDC IDC MSMT LEADCHNL LV IMPEDANCE VALUE: 304 Ohm
MDC IDC MSMT LEADCHNL RA SENSING INTR AMPL: 2.375 mV

## 2018-05-06 ENCOUNTER — Other Ambulatory Visit (HOSPITAL_COMMUNITY): Payer: Self-pay | Admitting: Internal Medicine

## 2018-05-06 NOTE — Telephone Encounter (Signed)
Pt needs appt for refills [phone] 

## 2018-05-07 ENCOUNTER — Other Ambulatory Visit (HOSPITAL_COMMUNITY): Payer: Self-pay

## 2018-05-07 ENCOUNTER — Other Ambulatory Visit (HOSPITAL_COMMUNITY): Payer: Self-pay | Admitting: Internal Medicine

## 2018-05-07 MED ORDER — SACUBITRIL-VALSARTAN 49-51 MG PO TABS: 1.0000 | ORAL_TABLET | Freq: Two times a day (BID) | ORAL | 0 refills | Status: DC

## 2018-05-07 MED ORDER — SPIRONOLACTONE 25 MG PO TABS: 12.5000 mg | ORAL_TABLET | Freq: Every day | ORAL | 0 refills | Status: DC

## 2018-05-14 NOTE — Progress Notes (Signed)
Remote ICD transmission.   

## 2018-05-18 ENCOUNTER — Other Ambulatory Visit: Payer: Self-pay | Admitting: Internal Medicine

## 2018-05-20 NOTE — Telephone Encounter (Signed)
Pt is requesting refill for magnesium oxide from Dr. Johney Frame, is he ok with filling this medication? Please address thank you.

## 2018-05-29 ENCOUNTER — Other Ambulatory Visit: Payer: Self-pay | Admitting: Internal Medicine

## 2018-05-29 DIAGNOSIS — I482 Chronic atrial fibrillation, unspecified: Secondary | ICD-10-CM

## 2018-05-31 ENCOUNTER — Ambulatory Visit (HOSPITAL_COMMUNITY)
Admission: RE | Admit: 2018-05-31 | Discharge: 2018-05-31 | Disposition: A | Discharge status: Home / Self Care | Payer: 59 | Source: Ambulatory Visit | Attending: Cardiology | Admitting: Cardiology

## 2018-05-31 VITALS — BP 128/80 | HR 74 | Wt 264.4 lb

## 2018-05-31 DIAGNOSIS — M109 Gout, unspecified: Secondary | ICD-10-CM | POA: Diagnosis not present

## 2018-05-31 DIAGNOSIS — Z95810 Presence of automatic (implantable) cardiac defibrillator: Secondary | ICD-10-CM | POA: Diagnosis not present

## 2018-05-31 DIAGNOSIS — E785 Hyperlipidemia, unspecified: Secondary | ICD-10-CM

## 2018-05-31 DIAGNOSIS — I10 Essential (primary) hypertension: Secondary | ICD-10-CM | POA: Insufficient documentation

## 2018-05-31 DIAGNOSIS — I471 Supraventricular tachycardia: Secondary | ICD-10-CM | POA: Insufficient documentation

## 2018-05-31 DIAGNOSIS — I4892 Unspecified atrial flutter: Secondary | ICD-10-CM | POA: Diagnosis not present

## 2018-05-31 DIAGNOSIS — I509 Heart failure, unspecified: Secondary | ICD-10-CM | POA: Diagnosis present

## 2018-05-31 DIAGNOSIS — I495 Sick sinus syndrome: Secondary | ICD-10-CM | POA: Diagnosis not present

## 2018-05-31 DIAGNOSIS — I429 Cardiomyopathy, unspecified: Secondary | ICD-10-CM | POA: Insufficient documentation

## 2018-05-31 DIAGNOSIS — Z79899 Other long term (current) drug therapy: Secondary | ICD-10-CM | POA: Insufficient documentation

## 2018-05-31 DIAGNOSIS — I493 Ventricular premature depolarization: Secondary | ICD-10-CM | POA: Insufficient documentation

## 2018-05-31 DIAGNOSIS — Z7901 Long term (current) use of anticoagulants: Secondary | ICD-10-CM | POA: Diagnosis not present

## 2018-05-31 DIAGNOSIS — I480 Paroxysmal atrial fibrillation: Secondary | ICD-10-CM | POA: Diagnosis not present

## 2018-05-31 DIAGNOSIS — I428 Other cardiomyopathies: Secondary | ICD-10-CM | POA: Diagnosis not present

## 2018-05-31 LAB — COMPREHENSIVE METABOLIC PANEL
ALT: 37 U/L (ref 0–44)
AST: 33 U/L (ref 15–41)
Albumin: 4.5 g/dL (ref 3.5–5.0)
Alkaline Phosphatase: 45 U/L (ref 38–126)
Anion gap: 9 (ref 5–15)
BUN: 8 mg/dL (ref 6–20)
CO2: 24 mmol/L (ref 22–32)
CREATININE: 0.79 mg/dL (ref 0.61–1.24)
Calcium: 9.1 mg/dL (ref 8.9–10.3)
Chloride: 105 mmol/L (ref 98–111)
GFR calc Af Amer: >60 mL/min (ref >60)
Glucose, Bld: 105 mg/dL — ABNORMAL HIGH (ref 70–99)
Potassium: 3.5 mmol/L (ref 3.5–5.1)
Sodium: 138 mmol/L (ref 135–145)
Total Bilirubin: 1.1 mg/dL (ref 0.3–1.2)
Total Protein: 6.9 g/dL (ref 6.5–8.1)

## 2018-05-31 LAB — LIPID PANEL
Cholesterol: 117 mg/dL (ref 0–200)
HDL: 36 mg/dL — ABNORMAL LOW (ref >40)
LDL Cholesterol: 30 mg/dL (ref 0–99)
TRIGLYCERIDES: 256 mg/dL — AB (ref <150)
Total CHOL/HDL Ratio: 3.3 RATIO
VLDL: 51 mg/dL — ABNORMAL HIGH (ref 0–40)

## 2018-05-31 NOTE — Patient Instructions (Signed)
No medication changes today!!  Labs today We will only contact you if something comes back abnormal or we need to make some changes. Otherwise no news is good news!  Your physician has requested that you have an echocardiogram. Echocardiography is a painless test that uses sound waves to create images of your heart. It provides your doctor with information about the size and shape of your heart and how well your heart's chambers and valves are working. This procedure takes approximately one hour. There are no restrictions for this procedure.  Your physician recommends that you schedule a follow-up appointment in: 6 months with an ECHO prior to visit.

## 2018-06-02 NOTE — Progress Notes (Signed)
Patient ID: Donald Grant, male   DOB: [birth year], 43 y.o.   MRN: [id number] PCP: Dr. Duaine Dredge HF Cardiology: Dr. Shirlee Latch  43 y.o. with history of cardiomyopathy, heavy ETOH use, paroxysmal atrial fibrillation and flutter with bradycardia, and frequent PVCs.  Patient has a long EP history.  He has paroxysmal atrial fibrillation with slow ventricular response.  He had atrial flutter ablation in 2012 and also has atrial tachycardia with focus near the AV node that has not been ablated.  He had another ablation of atypical atrial flutter in 4/17.  He had a holter in 5/15 showing a 3.8 second pause, predominant atrial fibrillation, and 39% PVCs (some of this was likely atrial fibrillation with aberrancy).  He had runs of NSVT.  He is now on dofetilide. He had a Medtronic CRT-D device placed with his bundle lead.  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  Cardiac MRI in 8/15 did not show evidence for ARVC.    He was referred to CHF clinic initially for management of his cardiomyopathy.  Echo in 7/15 suggested EF 30% but difficult to quantify due to PVCs.  Cardiac MRI in 8/15 showed EF 45% with normal RV and a nonspecific RV insertion site delayed enhancement pattern.  He stopped ETOH completely.  Last echo in 2/18 showed EF 50-55%.    He returns for followup of CHF. He has been doing well recently.  Still plays with a band about 5 days/week.  No significant exertional dyspnea.  No chest pain.  No lightheadedness. No orthopnea/PND.  He is drinking "more than moderately" again. No BRBPR/melena on Pradaxa.   Medtronic device interrogation: Fluid index < threshold, no prolonged atrial fibrillation, no VT, >99% His pacing    ECG: A-V dual pacing with QTc 462 msec  Optivol interrogation: Fluid index < threshold with stable impedance, occasional atrial fibrillation, no VT, 3-4 hours activity.   Labs (5/15): K 3.9, creatinine 0.7 Labs (8/15): creatinine  0.9 Labs (9/15): K 4.3, creatinine 0.83, pBNP 700 Labs (8/17): K 4.2, creatinine 0.71, HCT 42.2 Labs (11/17): hgb 15.4 Labs (12/19): K 4.1, creatinine 0.73  PMH: 1. History of  heavy ETOH use.  2. Atrial fibrillation: Paroxysmal.  CHADSVASC = 2, he is on Pradaxa.   3. HTN 4. Atrial flutter: s/p ablation 11/12.  He had ablation of atypical atrial flutter again in 4/17.  5. Atrial tachycardia: Mapped to AV node, not ablated.  6. Ventricular arrhythmias: NSVT, also very frequent PVCs. Some of beats that seem to be PVCs are likely aberrantly conducted atrial fibrillation.  Holter (5/15) with 3.8 second pause, atrial fibrillation predominantly, 39% PVCs (some of this likely is atrial fibrillation with aberrancy), runs of WCT (suspected NSVT).  7. Cardiomyopathy: Echo (7/15) with EF 30%, mild diffuse hypokinesis, mild LV dilation.  Cardiac MRI (8/15) with moderate LV dilation, EF 45%, diffuse hypokinesis, normal RV with no evidence for ARVC, delayed enhancement at the RV insertion sites which is a nonspecific pattern.  Lexiscan Cardiolite (5/15) with mild scar in the inferior and inferoseptal walls, no ischemia, not gated due to PVCs.  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   - Medtronic CRT-D with His bundle lead.  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  No tobacco.  Occasional marijuana.  Never used cocaine.  Prior heavy ETOH but has quit.   FH: Mother with SCD in her 28s and dilated cardiomyopathy.  Maternal  grandmother with SCD in her 49s, great-aunt with SCD, sister with PVCs.   ROS: All systems reviewed and negative except as per HPI.   Current Outpatient Medications  Medication Sig Dispense Refill  . acyclovir (ZOVIRAX) 400 MG tablet Take 400 mg by mouth 2 (two) times daily as needed (outbreak).     [name] allopurinol (ZYLOPRIM) 300 MG tablet Take 300 mg by mouth every morning.     [name] ALPRAZolam (XANAX) 0.5 MG tablet Take 0.25 tablets by mouth  daily as needed for anxiety.   1  . AMBULATORY NON FORMULARY MEDICATION Place 1 application 4 (four) times daily rectally. DILTIAZEM 2% compounded suspension.  (Get Rx filled at a Set designer, such as [facility] or [facility] in [place], [place]) 1 Tube 2  . atorvastatin (LIPITOR) 20 MG tablet Take 20 mg by mouth every evening.     . calcium carbonate (TUMS - DOSED IN MG ELEMENTAL CALCIUM) 500 MG chewable tablet Chew 2-3 tablets by mouth daily as needed for indigestion or heartburn.    . carvedilol (COREG) 6.25 MG tablet TAKE 1 TABLET BY MOUTH 2 TIMES DAILY 180 tablet 2  . Cholecalciferol (VITAMIN D) 2000 units CAPS Take 4,000 Units by mouth daily.    [name] dofetilide (TIKOSYN) 500 MCG capsule TAKE 1 CAPSULE BY MOUTH 2 TIMES DAILY 180 capsule 2  . fish oil-omega-3 fatty acids 1000 MG capsule Take 2 g by mouth every evening.     . indomethacin (INDOCIN) 25 MG capsule Take 25 mg by mouth 3 (three) times daily as needed (gout).    . magnesium oxide (MAG-OX) 400 (241.3 Mg) MG tablet Take 400 mg by mouth every morning.   11  . Multiple Vitamin (MULITIVITAMIN WITH MINERALS) TABS Take 1 tablet by mouth every evening.     [name] oxyCODONE (OXY IR/ROXICODONE) 5 MG immediate release tablet Take 1-2 tablets (5-10 mg total) every 4 (four) hours as needed by mouth for moderate pain, severe pain or breakthrough pain. 40 tablet 0  . PRADAXA 150 MG CAPS capsule TAKE 1 CAPSULE BY MOUTH TWICE A DAY 180 capsule 3  . Psyllium (METAMUCIL PO) Take by mouth. Use 2 teaspoons into water once daily    . sacubitril-valsartan (ENTRESTO) 49-51 MG Take 1 tablet by mouth 2 (two) times daily. 60 tablet 0  . spironolactone (ALDACTONE) 25 MG tablet TAKE 1/2 TABLET BY MOUTH ONCE DAILY 45 tablet 1  . vitamin B-12 (CYANOCOBALAMIN) 1000 MCG tablet Take 1,000 mcg by mouth at bedtime.     No current facility-administered medications for this encounter.     BP 128/80   Pulse 74   Wt 119.9 kg (264 lb 6.4 oz)   SpO2  96%   BMI 33.05 kg/m  General: NAD Neck: No JVD, no thyromegaly or thyroid nodule.  Lungs: Clear to auscultation bilaterally with normal respiratory effort. CV: Nondisplaced PMI.  Heart regular S1/S2, no S3/S4, no murmur.  No peripheral edema.  No carotid bruit.  Normal pedal pulses.  Abdomen: Soft, nontender, no hepatosplenomegaly, no distention.  Skin: Intact without lesions or rashes.  Neurologic: Alert and oriented x 3.  Psych: Normal affect. Extremities: No clubbing or cyanosis.  HEENT: Normal.   Assessment/Plan:  1. Cardiomyopathy:  Most recent echo in 2/18 showed EF improved to 50-55%.  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  He has a Secondary school teacher CRT-D device with His bundle lead.  NYHA class I currrently, not volume overloaded.  - We talked today about backing off again on ETOH intake. - Continue spironolactone, Entresto, and Coreg at current doses. - Check BMET. - I will repeat echo today.  If there has been any fall in EF, will need to continue to titrate up his meds.  2. Atrial fibrillation: Paroxysmal atrial fibrillation with bradycardic ventricular response, now has CRT device.  He is in atrial fibrillation today.  Device interrogation shows rare short atrial fibrillation.  - He is on Pradaxa with no bleeding sequelae.  - Continue dofetilide, paced QTc 462 msec today. BMET/Mg today.  - Needs to cut back on ETOH as heavy ETOH will increase risk of recurrent atrial fibrillation.  3. Atrial flutter: s/p ablation most recently in 4/17.   4. Atrial tachycardia: Prior history, mapped to area near AV node.  5. 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   7. Hyperlipidemia: Check lipids today.    Followup in 6 months if echo stable.   Marca Ancona 06/02/2018

## 2018-06-03 ENCOUNTER — Telehealth (HOSPITAL_COMMUNITY): Payer: Self-pay

## 2018-06-03 NOTE — Telephone Encounter (Signed)
-----   Message from Laurey Morale, MD sent at 06/02/2018 10:09 AM EDT ----- Labs ok except high TGs.  Adjust diet, cut back on beer intake.

## 2018-06-03 NOTE — Telephone Encounter (Signed)
Pt returned call, answered by Triage Marylene Land) who relayed message to pt

## 2018-06-05 ENCOUNTER — Other Ambulatory Visit (HOSPITAL_COMMUNITY): Payer: Self-pay | Admitting: Cardiology

## 2018-06-25 ENCOUNTER — Other Ambulatory Visit (HOSPITAL_COMMUNITY): Payer: Self-pay | Admitting: Cardiology

## 2018-08-08 ENCOUNTER — Other Ambulatory Visit: Payer: Self-pay

## 2018-08-08 ENCOUNTER — Ambulatory Visit (INDEPENDENT_AMBULATORY_CARE_PROVIDER_SITE_OTHER): Payer: 59 | Admitting: *Deleted

## 2018-08-08 DIAGNOSIS — I429 Cardiomyopathy, unspecified: Secondary | ICD-10-CM

## 2018-08-08 LAB — CUP PACEART REMOTE DEVICE CHECK
Battery Remaining Longevity: 16 mo
Battery Voltage: 2.89 V
Brady Statistic AP VP Percent: 99.68 %
Brady Statistic AP VS Percent: 0.14 %
Brady Statistic AS VP Percent: 0.16 %
Brady Statistic AS VS Percent: 0.02 %
Brady Statistic RA Percent Paced: 99.66 %
Brady Statistic RV Percent Paced: 99.58 %
Date Time Interrogation Session: 20200514041603
HighPow Impedance: 72 Ohm
Implantable Lead Implant Date: 20160628
Implantable Lead Implant Date: 20160628
Implantable Lead Implant Date: 20160628
Implantable Lead Location: 753858
Implantable Lead Location: 753859
Implantable Lead Location: 753860
Implantable Lead Model: 3830
Implantable Lead Model: 5076
Implantable Pulse Generator Implant Date: 20160628
Lead Channel Impedance Value: 247 Ohm
Lead Channel Impedance Value: 304 Ohm
Lead Channel Impedance Value: 399 Ohm
Lead Channel Impedance Value: 456 Ohm
Lead Channel Impedance Value: 475 Ohm
Lead Channel Impedance Value: 513 Ohm
Lead Channel Pacing Threshold Amplitude: 0.625 V
Lead Channel Pacing Threshold Amplitude: 0.875 V
Lead Channel Pacing Threshold Pulse Width: 0.4 ms
Lead Channel Pacing Threshold Pulse Width: 0.8 ms
Lead Channel Sensing Intrinsic Amplitude: 2.375 mV
Lead Channel Sensing Intrinsic Amplitude: 2.375 mV
Lead Channel Sensing Intrinsic Amplitude: 8.25 mV
Lead Channel Sensing Intrinsic Amplitude: 8.25 mV
Lead Channel Setting Pacing Amplitude: 0.5 V
Lead Channel Setting Pacing Amplitude: 2 V
Lead Channel Setting Pacing Amplitude: 2.5 V
Lead Channel Setting Pacing Pulse Width: 0.03 ms
Lead Channel Setting Pacing Pulse Width: 0.8 ms
Lead Channel Setting Sensing Sensitivity: 0.3 mV

## 2018-08-15 ENCOUNTER — Encounter: Payer: Self-pay | Admitting: Cardiology

## 2018-08-15 NOTE — Progress Notes (Signed)
Remote ICD transmission.   

## 2018-08-20 ENCOUNTER — Ambulatory Visit (HOSPITAL_COMMUNITY)
Admission: RE | Admit: 2018-08-20 | Discharge: 2018-08-20 | Disposition: A | Discharge status: Home / Self Care | Payer: 59 | Source: Ambulatory Visit | Attending: Cardiology | Admitting: Cardiology

## 2018-08-20 ENCOUNTER — Other Ambulatory Visit: Payer: Self-pay

## 2018-08-20 DIAGNOSIS — Z87891 Personal history of nicotine dependence: Secondary | ICD-10-CM | POA: Diagnosis not present

## 2018-08-20 DIAGNOSIS — I4891 Unspecified atrial fibrillation: Secondary | ICD-10-CM | POA: Diagnosis not present

## 2018-08-20 DIAGNOSIS — I429 Cardiomyopathy, unspecified: Secondary | ICD-10-CM | POA: Diagnosis not present

## 2018-08-20 DIAGNOSIS — I10 Essential (primary) hypertension: Secondary | ICD-10-CM | POA: Insufficient documentation

## 2018-08-20 NOTE — Progress Notes (Signed)
  Echocardiogram 2D Echocardiogram has been performed.  Adine Madura 08/20/2018, 10:47 AM

## 2018-09-02 ENCOUNTER — Other Ambulatory Visit (HOSPITAL_COMMUNITY): Payer: Self-pay | Admitting: Cardiology

## 2018-11-07 ENCOUNTER — Ambulatory Visit (INDEPENDENT_AMBULATORY_CARE_PROVIDER_SITE_OTHER): Payer: 59 | Admitting: *Deleted

## 2018-11-07 DIAGNOSIS — I429 Cardiomyopathy, unspecified: Secondary | ICD-10-CM

## 2018-11-07 LAB — CUP PACEART REMOTE DEVICE CHECK
Battery Remaining Longevity: 14 mo
Battery Voltage: 2.89 V
Brady Statistic AP VP Percent: 99.21 %
Brady Statistic AP VS Percent: 0.32 %
Brady Statistic AS VP Percent: 0.44 %
Brady Statistic AS VS Percent: 0.03 %
Brady Statistic RA Percent Paced: 99.13 %
Brady Statistic RV Percent Paced: 98.71 %
Date Time Interrogation Session: 20200813062822
HighPow Impedance: 72 Ohm
Implantable Lead Implant Date: 20160628
Implantable Lead Implant Date: 20160628
Implantable Lead Implant Date: 20160628
Implantable Lead Location: 753858
Implantable Lead Location: 753859
Implantable Lead Location: 753860
Implantable Lead Model: 3830
Implantable Lead Model: 5076
Implantable Pulse Generator Implant Date: 20160628
Lead Channel Impedance Value: 247 Ohm
Lead Channel Impedance Value: 285 Ohm
Lead Channel Impedance Value: 361 Ohm
Lead Channel Impedance Value: 418 Ohm
Lead Channel Impedance Value: 456 Ohm
Lead Channel Impedance Value: 456 Ohm
Lead Channel Sensing Intrinsic Amplitude: 2.75 mV
Lead Channel Sensing Intrinsic Amplitude: 8.625 mV
Lead Channel Setting Pacing Amplitude: 0.5 V
Lead Channel Setting Pacing Amplitude: 2 V
Lead Channel Setting Pacing Amplitude: 2.5 V
Lead Channel Setting Pacing Pulse Width: 0.03 ms
Lead Channel Setting Pacing Pulse Width: 0.8 ms
Lead Channel Setting Sensing Sensitivity: 0.3 mV

## 2018-11-18 ENCOUNTER — Encounter: Payer: Self-pay | Admitting: Cardiology

## 2018-11-18 NOTE — Progress Notes (Signed)
Remote ICD transmission.   

## 2018-11-26 ENCOUNTER — Other Ambulatory Visit (HOSPITAL_COMMUNITY): Payer: Self-pay | Admitting: Cardiology

## 2018-12-18 ENCOUNTER — Other Ambulatory Visit: Payer: Self-pay

## 2018-12-18 ENCOUNTER — Encounter: Payer: Self-pay | Admitting: Internal Medicine

## 2018-12-18 ENCOUNTER — Telehealth (INDEPENDENT_AMBULATORY_CARE_PROVIDER_SITE_OTHER): Payer: 59 | Admitting: Internal Medicine

## 2018-12-18 VITALS — Ht 75.0 in | Wt 255.0 lb

## 2018-12-18 DIAGNOSIS — I429 Cardiomyopathy, unspecified: Secondary | ICD-10-CM

## 2018-12-18 DIAGNOSIS — I428 Other cardiomyopathies: Secondary | ICD-10-CM

## 2018-12-18 DIAGNOSIS — I4819 Other persistent atrial fibrillation: Secondary | ICD-10-CM | POA: Diagnosis not present

## 2018-12-18 NOTE — Progress Notes (Signed)
Electrophysiology TeleHealth Note  Due to national recommendations of social distancing due to [place] 19, an audio telehealth visit is felt to be most appropriate for this patient at this time.  Verbal consent was obtained by me for the telehealth visit today.  The patient does not have capability for a Mychart visit.  A phone visit is therefore required today.   Date:  12/18/2018   ID:  Donald Grant, DOB 12-May-1975, MRN [id number]  Location: patient's home  Provider location:  [place]  Evaluation Performed: Follow-up visit  PCP:  Derinda Late, MD   Electrophysiologist:  Dr Rayann Heman  Chief Complaint:  palpitations  History of Present Illness:    Donald Grant is a 43 y.o. male who presents via telehealth conferencing today.  Since last being seen in our clinic, the patient reports doing very well.  Today, he denies symptoms of palpitations, chest pain, shortness of breath,  lower extremity edema, dizziness, presyncope, or syncope.  The patient is otherwise without complaint today.  The patient denies symptoms of fevers, chills, cough, or new SOB worrisome for COVID 19.  Past Medical History:  Diagnosis Date   Anxiety    Atrial fibrillation ([place])    PVI ablatioin at [facility] 04/21/14 and May 2016 Dr. Lurene Shadow   Atrial flutter [facility])    CTI ablation by Dr Lovena Le 02/24/11   Atrial tachycardia ([place])    mapped to AV node by Dr Lovena Le, ablation not performed   Cardiomyopathy, nonischemic ([place])    Medtronic BIVE ICD    Dyslipidemia    First degree AV block    GERD (gastroesophageal reflux disease)    Gout    bilateral feet   Hx of cardiovascular stress test    Lexiscan Myoview (07/2013):  Inf and inferolateral scar, no ischemia, no gated   Lymphadenopathy of right cervical region ?[place] 04/16/2013   Premature ventricular contraction     Past Surgical History:  Procedure Laterality Date   ABLATION OF DYSRHYTHMIC FOCUS  07/13/15   Atypical atrial flutter  ablation at [facility] by Dr Lurene Shadow   ATRIAL ABLATION SURGERY  02/24/11   CTI ablation by Dr Lovena Le   ATRIAL FIBRILLATION ABLATION  04/24/14, 08/13/14   PVI at [place] by Dr Lurene Shadow, [place] with posterior wall Box and FIRM ablation performed   ATRIAL FLUTTER ABLATION N/A 02/24/2011   Procedure: ATRIAL FLUTTER ABLATION;  Surgeon: Evans Lance, MD;  Location: [facility] CATH LAB;  Service: Cardiovascular;  Laterality: N/A;   CARDIAC DEFIBRILLATOR PLACEMENT     CARDIOVERSION  03/29/2011   Procedure: CARDIOVERSION;  Surgeon: Lelon Perla, MD;  Location: [place];  Service: Cardiovascular;  Laterality: N/A;   CARDIOVERSION N/A 02/25/2015   Procedure: CARDIOVERSION;  Surgeon: Pixie Casino, MD;  Location: [facility] ENDOSCOPY;  Service: Cardiovascular;  Laterality: N/A;   CARDIOVERSION (BEDSIDE)  03/29/2011       ELECTROPHYSIOLOGIC STUDY N/A 06/21/2015   Procedure: Cardioversion;  Surgeon: Evans Lance, MD;  Location: [place] CV LAB;  Service: Cardiovascular;  Laterality: N/A;   EVALUATION UNDER ANESTHESIA WITH HEMORRHOIDECTOMY N/A 02/05/2017   Procedure: EXAM UNDER ANESTHESIA;  Surgeon: Michael Boston, MD;  Location: [facility] ORS;  Service: General;  Laterality: N/A;   EXAMINATION UNDER ANESTHESIA  12/19/12   Anal Fistula   FISTULOTOMY N/A 02/05/2017   Procedure: REPAIR PERIRECTAL FISTULA ERAS PATHWAY;  Surgeon: Michael Boston, MD;  Location: [facility] ORS;  Service: General;  Laterality: N/A;   MOUTH SURGERY  PACEMAKER IMPLANT     ROOT CANAL      Current Outpatient Medications  Medication Sig Dispense Refill   acyclovir (ZOVIRAX) 400 MG tablet Take 400 mg by mouth 2 (two) times daily as needed (outbreak).      allopurinol (ZYLOPRIM) 300 MG tablet Take 300 mg by mouth every morning.      ALPRAZolam (XANAX) 0.5 MG tablet Take 0.25 tablets by mouth daily as needed for anxiety.   1   AMBULATORY NON FORMULARY MEDICATION Place 1 application 4 (four) times daily rectally. DILTIAZEM 2% compounded  suspension.  (Get Rx filled at a Set designer, such as [facility] or [facility] in [place], [place]) 1 Tube 2   atorvastatin (LIPITOR) 20 MG tablet Take 20 mg by mouth every evening.      calcium carbonate (TUMS - DOSED IN MG ELEMENTAL CALCIUM) 500 MG chewable tablet Chew 2-3 tablets by mouth daily as needed for indigestion or heartburn.     carvedilol (COREG) 6.25 MG tablet TAKE 1 TABLET BY MOUTH 2 TIMES DAILY 180 tablet 2   Cholecalciferol (VITAMIN D) 2000 units CAPS Take 4,000 Units by mouth daily.     dofetilide (TIKOSYN) 500 MCG capsule TAKE 1 CAPSULE BY MOUTH 2 TIMES DAILY 180 capsule 2   ENTRESTO 49-51 MG TAKE 1 TABLET BY MOUTH TWICE A DAY 180 tablet 3   fish oil-omega-3 fatty acids 1000 MG capsule Take 2 g by mouth every evening.      indomethacin (INDOCIN) 25 MG capsule Take 25 mg by mouth 3 (three) times daily as needed (gout).     magnesium oxide (MAG-OX) 400 (241.3 Mg) MG tablet Take 400 mg by mouth every morning.   11   Multiple Vitamin (MULITIVITAMIN WITH MINERALS) TABS Take 1 tablet by mouth every evening.      oxyCODONE (OXY IR/ROXICODONE) 5 MG immediate release tablet Take 1-2 tablets (5-10 mg total) every 4 (four) hours as needed by mouth for moderate pain, severe pain or breakthrough pain. 40 tablet 0   PRADAXA 150 MG CAPS capsule TAKE 1 CAPSULE BY MOUTH TWICE A DAY 180 capsule 3   Psyllium (METAMUCIL PO) Take by mouth. Use 2 teaspoons into water once daily     spironolactone (ALDACTONE) 25 MG tablet TAKE 1/2 TABLET BY MOUTH EVERY DAY 45 tablet 3   vitamin B-12 (CYANOCOBALAMIN) 1000 MCG tablet Take 1,000 mcg by mouth at bedtime.     No current facility-administered medications for this visit.     Allergies:   Patient has no known allergies.   Social History:  The patient  reports that he quit smoking about 8 years ago. He has never used smokeless tobacco. He reports current alcohol use. He reports current drug use. Drug: Marijuana.    Family History:  The patient's family history includes Achondroplasia in his mother and son; Alcohol abuse in his mother; Dilated cardiomyopathy in his mother; Heart disease in his mother; Other in his maternal aunt; Sudden death in his maternal aunt, maternal grandmother, and mother.   ROS:  Please see the history of present illness.   All other systems are personally reviewed and negative.    Exam:    Vital Signs:  There were no vitals taken for this visit.  He is going to his fathers house to check his BP later today. Well sounding, alert and conversant   Labs/Other Tests and Data Reviewed:    Recent Labs: 02/25/2018: Magnesium 1.9 05/31/2018: ALT 37; BUN 8; Creatinine,  Ser 0.79; Potassium 3.5; Sodium 138   Wt Readings from Last 3 Encounters:  05/31/18 264 lb 6.4 oz (119.9 kg)  02/25/18 269 lb 12.8 oz (122.4 kg)  05/28/17 261 lb (118.4 kg)    Echo 08/20/2018- EF 45-50%  Last device remote is reviewed from [employer] PDF which reveals normal device function   ekg 3/6/220- QT is stable, V paced  ASSESSMENT & PLAN:    1.  Persistent afib Maintaining sinus rhythm with tikosyn Follow-up with afib in 3 months On pradaxa,  chads2vasc score is 1. He wishes to continue this medicine currently  2. Chronic systolic dysfunction EF has improved Continue medical therapy  3. AV block Normal device function Remotes are uptodate  4. ETOH  Avoidance encouraged  Follow-up:  EP PA in 3 months   Patient Risk:  after full review of this patients clinical status, I feel that they are at moderate risk at this time.  Today, I have spent 15 minutes with the patient with telehealth technology discussing arrhythmia management .    Randolm Idol, MD  12/18/2018 12:24 PM     [facility] [address] [place] [address] [phone] (office) [phone] (fax)

## 2019-01-04 ENCOUNTER — Other Ambulatory Visit: Payer: Self-pay | Admitting: Internal Medicine

## 2019-02-06 ENCOUNTER — Ambulatory Visit (INDEPENDENT_AMBULATORY_CARE_PROVIDER_SITE_OTHER): Payer: 59 | Admitting: *Deleted

## 2019-02-06 DIAGNOSIS — I4819 Other persistent atrial fibrillation: Secondary | ICD-10-CM | POA: Diagnosis not present

## 2019-02-06 DIAGNOSIS — I428 Other cardiomyopathies: Secondary | ICD-10-CM

## 2019-02-06 LAB — CUP PACEART REMOTE DEVICE CHECK
Battery Remaining Longevity: 11 mo
Battery Voltage: 2.87 V
Brady Statistic AP VP Percent: 98.01 %
Brady Statistic AP VS Percent: 0.22 %
Brady Statistic AS VP Percent: 1.74 %
Brady Statistic AS VS Percent: 0.03 %
Brady Statistic RA Percent Paced: 96.65 %
Brady Statistic RV Percent Paced: 99.41 %
Date Time Interrogation Session: 20201112072725
HighPow Impedance: 75 Ohm
Implantable Lead Implant Date: 20160628
Implantable Lead Implant Date: 20160628
Implantable Lead Implant Date: 20160628
Implantable Lead Location: 753858
Implantable Lead Location: 753859
Implantable Lead Location: 753860
Implantable Lead Model: 3830
Implantable Lead Model: 5076
Implantable Pulse Generator Implant Date: 20160628
Lead Channel Impedance Value: 285 Ohm
Lead Channel Impedance Value: 304 Ohm
Lead Channel Impedance Value: 418 Ohm
Lead Channel Impedance Value: 475 Ohm
Lead Channel Impedance Value: 475 Ohm
Lead Channel Impedance Value: 513 Ohm
Lead Channel Pacing Threshold Amplitude: 0.625 V
Lead Channel Pacing Threshold Amplitude: 0.875 V
Lead Channel Pacing Threshold Pulse Width: 0.4 ms
Lead Channel Pacing Threshold Pulse Width: 0.8 ms
Lead Channel Sensing Intrinsic Amplitude: 2.5 mV
Lead Channel Sensing Intrinsic Amplitude: 2.5 mV
Lead Channel Sensing Intrinsic Amplitude: 7.375 mV
Lead Channel Sensing Intrinsic Amplitude: 7.375 mV
Lead Channel Setting Pacing Amplitude: 0.5 V
Lead Channel Setting Pacing Amplitude: 2 V
Lead Channel Setting Pacing Amplitude: 2.5 V
Lead Channel Setting Pacing Pulse Width: 0.03 ms
Lead Channel Setting Pacing Pulse Width: 0.8 ms
Lead Channel Setting Sensing Sensitivity: 0.3 mV

## 2019-02-28 NOTE — Progress Notes (Signed)
Remote ICD transmission.   

## 2019-03-15 ENCOUNTER — Other Ambulatory Visit: Payer: Self-pay | Admitting: Internal Medicine

## 2019-03-15 DIAGNOSIS — I482 Chronic atrial fibrillation, unspecified: Secondary | ICD-10-CM

## 2019-03-16 NOTE — Progress Notes (Signed)
PCP:  Mosetta Putt, MD Electrophysiologist: Dr. Renee Rival JALIL KAMHOLZ is a 43 y.o. male with past medical history of persistent AF, chronic systolic CHF, AV block, and ETOH use who presents today for routine electrophysiology followup. They are seen for Dr. Johney Frame.   Since last being seen in our clinic, the patient reports doing very well.  He jogs a 5K most days and feels great. He denies symptoms of palpitations, chest pain, shortness of breath, orthopnea, PND, lower extremity edema, claudication, dizziness, presyncope, syncope, bleeding, or neurologic sequela. The patient is tolerating medications without difficulties.    The patient feels that he is tolerating medications without difficulties and is otherwise without complaint today.   DEVICE HISTORY S/p Medtronic BIVICD with LV as HIS lead rather than CS lead on 09/22/2014 for Chronic systolic CHF and AV block  Past Medical History:  Diagnosis Date  . Anxiety   . Atrial fibrillation (HCC)    PVI ablatioin at [facility] 04/21/14 and May 2016 Dr. [name]  . Atrial flutter [facility])    CTI ablation by Dr [name] 02/24/11  . Atrial tachycardia (HCC)    mapped to AV node by Dr [name], ablation not performed  . Cardiomyopathy, nonischemic (HCC)    Medtronic BIVE ICD   . Dyslipidemia   . First degree AV block   . GERD (gastroesophageal reflux disease)   . Gout    bilateral feet  . Hx of cardiovascular stress test    Lexiscan Myoview (07/2013):  Inf and inferolateral scar, no ischemia, no gated  . Lymphadenopathy of right cervical region ?Mono 04/16/2013  . Premature ventricular contraction    Past Surgical History:  Procedure Laterality Date  . ABLATION OF DYSRHYTHMIC FOCUS  07/13/15   Atypical atrial flutter ablation at [facility] by Dr [name]  . ATRIAL ABLATION SURGERY  02/24/11   CTI ablation by Dr [name]  . ATRIAL FIBRILLATION ABLATION  04/24/14, 08/13/14   PVI at [facility] by Dr [name], PVI with posterior wall Box and FIRM ablation performed    . ATRIAL FLUTTER ABLATION N/A 02/24/2011   Procedure: ATRIAL FLUTTER ABLATION;  Surgeon: [name], MD;  Location[facility] CATH LAB;  Service: Cardiovascular;  Laterality: N/A;  . CARDIAC DEFIBRILLATOR PLACEMENT    . CARDIOVERSION  03/29/2011   Procedure: CARDIOVERSION;  Surgeon: [name], MD;  Location: [facility] OR;  Service: Cardiovascular;  Laterality: N/A;  . CARDIOVERSION N/A 02/25/2015   Procedure: CARDIOVERSION;  Surgeon: [name], MD;  Location: [facility] ENDOSCOPY;  Service: Cardiovascular;  Laterality: N/A;  . CARDIOVERSION (BEDSIDE)  03/29/2011      . ELECTROPHYSIOLOGIC STUDY N/A 06/21/2015   Procedure: Cardioversion;  Surgeon: [name], MD;  Location: [facility] INVASIVE CV LAB;  Service: Cardiovascular;  Laterality: N/A;  . EVALUATION UNDER ANESTHESIA WITH HEMORRHOIDECTOMY N/A 02/05/2017   Procedure: EXAM UNDER ANESTHESIA;  Surgeon: Karie Soda, MD;  Location: [facility] ORS;  Service: General;  Laterality: N/A;  . EXAMINATION UNDER ANESTHESIA  12/19/12   Anal Fistula  . FISTULOTOMY N/A 02/05/2017   Procedure: REPAIR PERIRECTAL FISTULA ERAS PATHWAY;  Surgeon: Karie Soda, MD;  Location: [facility] ORS;  Service: General;  Laterality: N/A;  . MOUTH SURGERY    . PACEMAKER IMPLANT    . ROOT CANAL      Current Outpatient Medications  Medication Sig Dispense Refill  . acyclovir (ZOVIRAX) 400 MG tablet Take 400 mg by mouth 2 (two) times daily as needed (outbreak).     [name] allopurinol (ZYLOPRIM)  300 MG tablet Take 300 mg by mouth every morning.     [name] ALPRAZolam (XANAX) 0.5 MG tablet Take 0.25 tablets by mouth daily as needed for anxiety.   1  . atorvastatin (LIPITOR) 20 MG tablet Take 20 mg by mouth every evening.     . calcium carbonate (TUMS - DOSED IN MG ELEMENTAL CALCIUM) 500 MG chewable tablet Chew 2-3 tablets by mouth daily as needed for indigestion or heartburn.    . carvedilol (COREG) 6.25 MG tablet TAKE 1 TABLET BY MOUTH TWICE A DAY 180 tablet 3  . Cholecalciferol (VITAMIN D) 2000 units  CAPS Take 4,000 Units by mouth daily.    [name] dofetilide (TIKOSYN) 500 MCG capsule Take 1 capsule (500 mcg total) by mouth 2 (two) times daily. Please keep upcoming appt in December for future refills. Thank you 180 capsule 0  . ENTRESTO 49-51 MG TAKE 1 TABLET BY MOUTH TWICE A DAY 180 tablet 3  . fish oil-omega-3 fatty acids 1000 MG capsule Take 2 capsules by mouth 2 (two) times daily.     . magnesium oxide (MAG-OX) 400 (241.3 Mg) MG tablet Take 400 mg by mouth every morning.   11  . Multiple Vitamin (MULITIVITAMIN WITH MINERALS) TABS Take 1 tablet by mouth every evening.     [name] PRADAXA 150 MG CAPS capsule TAKE 1 CAPSULE BY MOUTH TWICE A DAY 180 capsule 3  . Psyllium (METAMUCIL PO) Take by mouth. Use 2 teaspoons into water once daily    . spironolactone (ALDACTONE) 25 MG tablet TAKE 1/2 TABLET BY MOUTH EVERY DAY 45 tablet 3  . vitamin B-12 (CYANOCOBALAMIN) 1000 MCG tablet Take 1,000 mcg by mouth at bedtime.     No current facility-administered medications for this visit.    No Known Allergies  Social History   Socioeconomic History  . Marital status: Married    Spouse name: Not on file  . Number of children: Not on file  . Years of education: Not on file  . Highest education level: Not on file  Occupational History  . Not on file  Tobacco Use  . Smoking status: Former Smoker    Quit date: 08/28/2010    Years since quitting: 8.5  . Smokeless tobacco: Never Used  Substance and Sexual Activity  . Alcohol use: Yes    Comment: 3-4 times weekly  . Drug use: Yes    Types: Marijuana    Comment: still currently smoking, marijuana twice monthly last time 01/27/17  . Sexual activity: Yes  Other Topics Concern  . Not on file  Social History Narrative   His additional social history is notable that the patient exercises     several times a week, jogging up to 2 miles a day.  With exercise, he     notes that he has lost over 10 pounds.    Social Determinants of Health   Financial Resource  Strain:   . Difficulty of Paying Living Expenses: Not on file  Food Insecurity:   . Worried About Charity fundraiser in the Last Year: Not on file  . Ran Out of Food in the Last Year: Not on file  Transportation Needs:   . Lack of Transportation (Medical): Not on file  . Lack of Transportation (Non-Medical): Not on file  Physical Activity:   . Days of Exercise per Week: Not on file  . Minutes of Exercise per Session: Not on file  Stress:   . Feeling of Stress : Not on  file  Social Connections:   . Frequency of Communication with Friends and Family: Not on file  . Frequency of Social Gatherings with Friends and Family: Not on file  . Attends Religious Services: Not on file  . Active Member of Clubs or Organizations: Not on file  . Attends Banker Meetings: Not on file  . Marital Status: Not on file  Intimate Partner Violence:   . Fear of Current or Ex-Partner: Not on file  . Emotionally Abused: Not on file  . Physically Abused: Not on file  . Sexually Abused: Not on file     Review of Systems: General: No chills, fever, night sweats or weight changes  Cardiovascular:  No chest pain, dyspnea on exertion, edema, orthopnea, palpitations, paroxysmal nocturnal dyspnea Dermatological: No rash, lesions or masses Respiratory: No cough, dyspnea Urologic: No hematuria, dysuria Abdominal: No nausea, vomiting, diarrhea, bright red blood per rectum, melena, or hematemesis Neurologic: No visual changes, weakness, changes in mental status All other systems reviewed and are otherwise negative except as noted above.  Physical Exam: Vitals:   03/17/19 1052  BP: 128/80  Pulse: 76  SpO2: 99%  Weight: 265 lb 12.8 oz (120.6 kg)  Height: 6\' 3"  (1.905 m)    GEN- The patient is well appearing, alert and oriented x 3 today.   HEENT: normocephalic, atraumatic; sclera clear, conjunctiva pink; hearing intact; oropharynx clear; neck supple, no JVP Lymph- no cervical  lymphadenopathy Lungs- Clear to ausculation bilaterally, normal work of breathing.  No wheezes, rales, rhonchi Heart- Regular rate and rhythm, no murmurs, rubs or gallops, PMI not laterally displaced GI- soft, non-tender, non-distended, bowel sounds present, no hepatosplenomegaly Extremities- no clubbing, cyanosis, or edema; DP/PT/radial pulses 2+ bilaterally MS- no significant deformity or atrophy Skin- warm and dry, no rash or lesion Psych- euthymic mood, full affect Neuro- strength and sensation are intact  EKG is ordered. Personal review of EKG from today shows V paced rhythm at 76 bpm and QRS 124 ms (HIS/LBBB pacing)  Assessment and Plan:  1. Persistent AF Maintaining sinus rhythm on Tikosyn Continue pradaxa with CHA2DS2VASC of 1 ; Pt has wished to continue  2. Chronic systolic CHF Echo 08/20/2018 showed LVEF 45-50% Continue current medical regimen managmed by CHF team He would like to see Dr. 08/22/2018 to talk about Jardiance.  3. AV block s/p Medtronic CRT-D (LV in HIS) Stable device function. Continue remotes. See Paceart report 11 months until ERI. Will change remotes to Monthly.   4. ETOH Encouraged avoidance.   RTC 6 months with EP APP, and 12 months with Dr. Shirlee Latch to discuss ERI/gen change.   Johney Frame, PA-C  03/17/19 11:15 AM

## 2019-03-17 ENCOUNTER — Ambulatory Visit: Payer: 59 | Admitting: Student

## 2019-03-17 ENCOUNTER — Encounter (INDEPENDENT_AMBULATORY_CARE_PROVIDER_SITE_OTHER): Payer: Self-pay

## 2019-03-17 ENCOUNTER — Encounter: Payer: Self-pay | Admitting: Student

## 2019-03-17 ENCOUNTER — Other Ambulatory Visit: Payer: Self-pay

## 2019-03-17 VITALS — BP 128/80 | HR 76 | Ht 75.0 in | Wt 265.8 lb

## 2019-03-17 DIAGNOSIS — I4819 Other persistent atrial fibrillation: Secondary | ICD-10-CM

## 2019-03-17 DIAGNOSIS — Z95810 Presence of automatic (implantable) cardiac defibrillator: Secondary | ICD-10-CM

## 2019-03-17 DIAGNOSIS — I495 Sick sinus syndrome: Secondary | ICD-10-CM

## 2019-03-17 DIAGNOSIS — I428 Other cardiomyopathies: Secondary | ICD-10-CM

## 2019-03-17 DIAGNOSIS — I429 Cardiomyopathy, unspecified: Secondary | ICD-10-CM | POA: Diagnosis not present

## 2019-03-17 LAB — CUP PACEART INCLINIC DEVICE CHECK
Battery Remaining Longevity: 11 mo
Battery Voltage: 2.86 V
Brady Statistic AP VP Percent: 99.06 %
Brady Statistic AP VS Percent: 0.23 %
Brady Statistic AS VP Percent: 0.67 %
Brady Statistic AS VS Percent: 0.04 %
Brady Statistic RA Percent Paced: 98.74 %
Brady Statistic RV Percent Paced: 99.19 %
Date Time Interrogation Session: 20201221111636
HighPow Impedance: 63 Ohm
Implantable Lead Implant Date: 20160628
Implantable Lead Implant Date: 20160628
Implantable Lead Implant Date: 20160628
Implantable Lead Location: 753858
Implantable Lead Location: 753859
Implantable Lead Location: 753860
Implantable Lead Model: 3830
Implantable Lead Model: 5076
Implantable Pulse Generator Implant Date: 20160628
Lead Channel Impedance Value: 247 Ohm
Lead Channel Impedance Value: 304 Ohm
Lead Channel Impedance Value: 361 Ohm
Lead Channel Impedance Value: 456 Ohm
Lead Channel Impedance Value: 475 Ohm
Lead Channel Impedance Value: 513 Ohm
Lead Channel Pacing Threshold Amplitude: 0.625 V
Lead Channel Pacing Threshold Amplitude: 0.875 V
Lead Channel Pacing Threshold Pulse Width: 0.4 ms
Lead Channel Pacing Threshold Pulse Width: 0.8 ms
Lead Channel Sensing Intrinsic Amplitude: 1.375 mV
Lead Channel Sensing Intrinsic Amplitude: 1.375 mV
Lead Channel Sensing Intrinsic Amplitude: 13.5 mV
Lead Channel Sensing Intrinsic Amplitude: 7.75 mV
Lead Channel Setting Pacing Amplitude: 0.5 V
Lead Channel Setting Pacing Amplitude: 2 V
Lead Channel Setting Pacing Amplitude: 2.5 V
Lead Channel Setting Pacing Pulse Width: 0.03 ms
Lead Channel Setting Pacing Pulse Width: 0.8 ms
Lead Channel Setting Sensing Sensitivity: 0.3 mV

## 2019-03-17 NOTE — Patient Instructions (Signed)
Medication Instructions:  none *If you need a refill on your cardiac medications before your next appointment, please call your pharmacy*  Lab Work:TODAY CBC BMET MAGNESIUM If you have labs (blood work) drawn today and your tests are completely normal, you will receive your results only by: [name] MyChart Message (if you have MyChart) OR . A paper copy in the mail If you have any lab test that is abnormal or we need to change your treatment, we will call you to review the results.  Testing/Procedures: none  Follow-Up: At [facility], you and your health needs are our priority.  As part of our continuing mission to provide you with exceptional heart care, we have created designated Provider Care Teams.  These Care Teams include your primary Cardiologist (physician) and Advanced Practice Providers (APPs -  Physician Assistants and Nurse Practitioners) who all work together to provide you with the care you need, when you need it.  Your next appointment:   6 months with Oda Kilts, PA  1 year with Dr Rayann Heman   Other Instructions Remote monitoring is used to monitor your  ICD from home. This monitoring reduces the number of office visits required to check your device to one time per year. It allows [place] to keep an eye on the functioning of your device to ensure it is working properly. You are scheduled for a device check from home on 05/08/19. You may send your transmission at any time that day. If you have a wireless device, the transmission will be sent automatically. After your physician reviews your transmission, you will receive a postcard with your next transmission date.

## 2019-03-18 LAB — CBC
Hematocrit: 41.5 % (ref 37.5–51.0)
Hemoglobin: 14.3 g/dL (ref 13.0–17.7)
MCH: 31.8 pg (ref 26.6–33.0)
MCHC: 34.5 g/dL (ref 31.5–35.7)
MCV: 92 fL (ref 79–97)
Platelets: 235 10*3/uL (ref 150–450)
RBC: 4.49 x10E6/uL (ref 4.14–5.80)
RDW: 12.8 % (ref 11.6–15.4)
WBC: 7.3 10*3/uL (ref 3.4–10.8)

## 2019-03-18 LAB — BASIC METABOLIC PANEL
BUN/Creatinine Ratio: 14 (ref 9–20)
BUN: 10 mg/dL (ref 6–24)
CO2: 23 mmol/L (ref 20–29)
Calcium: 9.3 mg/dL (ref 8.7–10.2)
Chloride: 101 mmol/L (ref 96–106)
Creatinine, Ser: 0.73 mg/dL — ABNORMAL LOW (ref 0.76–1.27)
GFR calc Af Amer: 130 mL/min/{1.73_m2} (ref >59)
GFR calc non Af Amer: 113 mL/min/{1.73_m2} (ref >59)
Glucose: 101 mg/dL — ABNORMAL HIGH (ref 65–99)
Potassium: 4.3 mmol/L (ref 3.5–5.2)
Sodium: 140 mmol/L (ref 134–144)

## 2019-03-18 LAB — MAGNESIUM: Magnesium: 2 mg/dL (ref 1.6–2.3)

## 2019-03-29 ENCOUNTER — Other Ambulatory Visit: Payer: Self-pay | Admitting: Internal Medicine

## 2019-03-31 ENCOUNTER — Other Ambulatory Visit: Payer: Self-pay | Admitting: Internal Medicine

## 2019-03-31 NOTE — Telephone Encounter (Signed)
Pt last saw Maxine Glenn, [place] on 03/17/19, last labs 03/17/19 Creat 0.73, age 44, weight 120.6kg, CrCl 220.27, based on CrCl pt is on appropriate dosage of Pradaxa 150mg  BID.  Will refill rx.

## 2019-04-11 ENCOUNTER — Other Ambulatory Visit: Payer: Self-pay

## 2019-04-11 ENCOUNTER — Other Ambulatory Visit: Payer: Self-pay | Admitting: Internal Medicine

## 2019-04-11 MED ORDER — DABIGATRAN ETEXILATE MESYLATE 150 MG PO CAPS: 150.0000 mg | ORAL_CAPSULE | Freq: Two times a day (BID) | ORAL | 3 refills | Status: DC

## 2019-04-11 NOTE — Telephone Encounter (Signed)
Pt called and left message asking for a refill on PRADAXA, pt states he is having some sort of issue with his insurance. Called and left message for pt to call the office and explain further.

## 2019-04-11 NOTE — Telephone Encounter (Signed)
*  STAT* If patient is at the pharmacy, call can be transferred to refill team.   1. Which medications need to be refilled? (please list name of each medication and dose if known) dabigatran (PRADAXA) 150 MG CAPS capsule  2. Which pharmacy/location (including street and city if local pharmacy) is medication to be sent to? [id number] - [place], [place] - [address] AT [address]  3. Do they need a 30 day or 90 day supply? 90 Day   Patient runs out of medication tomorrow.

## 2019-04-14 ENCOUNTER — Ambulatory Visit (INDEPENDENT_AMBULATORY_CARE_PROVIDER_SITE_OTHER): Payer: 59 | Admitting: *Deleted

## 2019-04-14 DIAGNOSIS — I4819 Other persistent atrial fibrillation: Secondary | ICD-10-CM

## 2019-04-14 MED ORDER — DABIGATRAN ETEXILATE MESYLATE 150 MG PO CAPS: 150.0000 mg | ORAL_CAPSULE | Freq: Two times a day (BID) | ORAL | 1 refills | Status: DC

## 2019-04-15 ENCOUNTER — Telehealth: Payer: Self-pay

## 2019-04-15 LAB — CUP PACEART REMOTE DEVICE CHECK
Battery Remaining Longevity: 10 mo
Battery Voltage: 2.86 V
Brady Statistic AP VP Percent: 98.99 %
Brady Statistic AP VS Percent: 0.34 %
Brady Statistic AS VP Percent: 0.66 %
Brady Statistic AS VS Percent: 0.01 %
Brady Statistic RA Percent Paced: 99.24 %
Brady Statistic RV Percent Paced: 99.25 %
Date Time Interrogation Session: 20210119003622
HighPow Impedance: 70 Ohm
Implantable Lead Implant Date: 20160628
Implantable Lead Implant Date: 20160628
Implantable Lead Implant Date: 20160628
Implantable Lead Location: 753858
Implantable Lead Location: 753859
Implantable Lead Location: 753860
Implantable Lead Model: 3830
Implantable Lead Model: 5076
Implantable Pulse Generator Implant Date: 20160628
Lead Channel Impedance Value: 247 Ohm
Lead Channel Impedance Value: 304 Ohm
Lead Channel Impedance Value: 399 Ohm
Lead Channel Impedance Value: 456 Ohm
Lead Channel Impedance Value: 475 Ohm
Lead Channel Impedance Value: 513 Ohm
Lead Channel Pacing Threshold Amplitude: 0.625 V
Lead Channel Pacing Threshold Amplitude: 0.875 V
Lead Channel Pacing Threshold Pulse Width: 0.4 ms
Lead Channel Pacing Threshold Pulse Width: 0.8 ms
Lead Channel Sensing Intrinsic Amplitude: 1 mV
Lead Channel Sensing Intrinsic Amplitude: 1 mV
Lead Channel Sensing Intrinsic Amplitude: 13.5 mV
Lead Channel Sensing Intrinsic Amplitude: 7.75 mV
Lead Channel Setting Pacing Amplitude: 0.5 V
Lead Channel Setting Pacing Amplitude: 2 V
Lead Channel Setting Pacing Amplitude: 2.5 V
Lead Channel Setting Pacing Pulse Width: 0.03 ms
Lead Channel Setting Pacing Pulse Width: 0.8 ms
Lead Channel Setting Sensing Sensitivity: 0.3 mV

## 2019-04-15 NOTE — Addendum Note (Signed)
Addended by: Geralyn Flash D on: 04/15/2019 10:45 AM   Modules accepted: Level of Service

## 2019-04-15 NOTE — Telephone Encounter (Signed)
The following message was received from Covermymeds:  Santiago Glad Key: Lennette Bihari - PA Case ID: [id number] Approved today  Case [id number]; Status:Approved; Review Type:Prior Auth;Coverage  Start Date:04/15/2019;Coverage End Date:04/14/2020;  Drug Pradaxa 150MG  capsules  Form [employer] Electronic PA Form  I have notified [facility] of this approval.

## 2019-04-15 NOTE — Telephone Encounter (Signed)
**Note De-Identified Donald Grant Obfuscation** I have started a Pradaxa PA through covermymeds. Key: [id number]

## 2019-05-08 ENCOUNTER — Ambulatory Visit (INDEPENDENT_AMBULATORY_CARE_PROVIDER_SITE_OTHER): Payer: 59 | Admitting: *Deleted

## 2019-05-08 DIAGNOSIS — I4819 Other persistent atrial fibrillation: Secondary | ICD-10-CM | POA: Diagnosis not present

## 2019-05-08 LAB — CUP PACEART REMOTE DEVICE CHECK
Battery Remaining Longevity: 9 mo
Battery Voltage: 2.85 V
Brady Statistic AP VP Percent: 98.67 %
Brady Statistic AP VS Percent: 0.75 %
Brady Statistic AS VP Percent: 0.54 %
Brady Statistic AS VS Percent: 0.03 %
Brady Statistic RA Percent Paced: 99.33 %
Brady Statistic RV Percent Paced: 98.75 %
Date Time Interrogation Session: 20210211031703
HighPow Impedance: 71 Ohm
Implantable Lead Implant Date: 20160628
Implantable Lead Implant Date: 20160628
Implantable Lead Implant Date: 20160628
Implantable Lead Location: 753858
Implantable Lead Location: 753859
Implantable Lead Location: 753860
Implantable Lead Model: 3830
Implantable Lead Model: 5076
Implantable Pulse Generator Implant Date: 20160628
Lead Channel Impedance Value: 247 Ohm
Lead Channel Impedance Value: 285 Ohm
Lead Channel Impedance Value: 361 Ohm
Lead Channel Impedance Value: 418 Ohm
Lead Channel Impedance Value: 456 Ohm
Lead Channel Impedance Value: 456 Ohm
Lead Channel Pacing Threshold Amplitude: 0.625 V
Lead Channel Pacing Threshold Amplitude: 0.875 V
Lead Channel Pacing Threshold Pulse Width: 0.4 ms
Lead Channel Pacing Threshold Pulse Width: 0.8 ms
Lead Channel Sensing Intrinsic Amplitude: 1.125 mV
Lead Channel Sensing Intrinsic Amplitude: 1.125 mV
Lead Channel Sensing Intrinsic Amplitude: 13.5 mV
Lead Channel Sensing Intrinsic Amplitude: 7.75 mV
Lead Channel Setting Pacing Amplitude: 0.5 V
Lead Channel Setting Pacing Amplitude: 2 V
Lead Channel Setting Pacing Amplitude: 2.5 V
Lead Channel Setting Pacing Pulse Width: 0.03 ms
Lead Channel Setting Pacing Pulse Width: 0.8 ms
Lead Channel Setting Sensing Sensitivity: 0.3 mV

## 2019-05-09 NOTE — Progress Notes (Signed)
ICD Remote  

## 2019-06-09 ENCOUNTER — Ambulatory Visit (INDEPENDENT_AMBULATORY_CARE_PROVIDER_SITE_OTHER): Payer: 59 | Admitting: *Deleted

## 2019-06-09 DIAGNOSIS — I4819 Other persistent atrial fibrillation: Secondary | ICD-10-CM

## 2019-06-09 LAB — CUP PACEART REMOTE DEVICE CHECK
Battery Remaining Longevity: 8 mo
Battery Voltage: 2.84 V
Brady Statistic AP VP Percent: 98.6 %
Brady Statistic AP VS Percent: 1.02 %
Brady Statistic AS VP Percent: 0.36 %
Brady Statistic AS VS Percent: 0.02 %
Brady Statistic RA Percent Paced: 99.52 %
Brady Statistic RV Percent Paced: 97.69 %
Date Time Interrogation Session: 20210315042205
HighPow Impedance: 68 Ohm
Implantable Lead Implant Date: 20160628
Implantable Lead Implant Date: 20160628
Implantable Lead Implant Date: 20160628
Implantable Lead Location: 753858
Implantable Lead Location: 753859
Implantable Lead Location: 753860
Implantable Lead Model: 3830
Implantable Lead Model: 5076
Implantable Pulse Generator Implant Date: 20160628
Lead Channel Impedance Value: 247 Ohm
Lead Channel Impedance Value: 285 Ohm
Lead Channel Impedance Value: 342 Ohm
Lead Channel Impedance Value: 418 Ohm
Lead Channel Impedance Value: 418 Ohm
Lead Channel Impedance Value: 456 Ohm
Lead Channel Pacing Threshold Amplitude: 0.625 V
Lead Channel Pacing Threshold Amplitude: 0.875 V
Lead Channel Pacing Threshold Pulse Width: 0.4 ms
Lead Channel Pacing Threshold Pulse Width: 0.8 ms
Lead Channel Sensing Intrinsic Amplitude: 2.125 mV
Lead Channel Sensing Intrinsic Amplitude: 2.125 mV
Lead Channel Sensing Intrinsic Amplitude: 7.25 mV
Lead Channel Sensing Intrinsic Amplitude: 7.25 mV
Lead Channel Setting Pacing Amplitude: 0.5 V
Lead Channel Setting Pacing Amplitude: 2 V
Lead Channel Setting Pacing Amplitude: 2.5 V
Lead Channel Setting Pacing Pulse Width: 0.03 ms
Lead Channel Setting Pacing Pulse Width: 0.8 ms
Lead Channel Setting Sensing Sensitivity: 0.3 mV

## 2019-06-13 ENCOUNTER — Other Ambulatory Visit (HOSPITAL_COMMUNITY): Payer: Self-pay | Admitting: Cardiology

## 2019-06-18 ENCOUNTER — Other Ambulatory Visit: Payer: Self-pay | Admitting: Internal Medicine

## 2019-06-18 NOTE — Telephone Encounter (Signed)
Pt's pharmacy is requesting a refill on magnesium. This medication has not been refilled since 2017. Would Dr. Johney Frame like to refill this medication? Please address

## 2019-07-14 ENCOUNTER — Ambulatory Visit (INDEPENDENT_AMBULATORY_CARE_PROVIDER_SITE_OTHER): Payer: 59 | Admitting: *Deleted

## 2019-07-14 DIAGNOSIS — I4819 Other persistent atrial fibrillation: Secondary | ICD-10-CM

## 2019-07-14 LAB — CUP PACEART REMOTE DEVICE CHECK
Battery Remaining Longevity: 8 mo
Battery Voltage: 2.84 V
Brady Statistic AP VP Percent: 99.29 %
Brady Statistic AP VS Percent: 0.46 %
Brady Statistic AS VP Percent: 0.23 %
Brady Statistic AS VS Percent: 0.02 %
Brady Statistic RA Percent Paced: 99.62 %
Brady Statistic RV Percent Paced: 99 %
Date Time Interrogation Session: 20210419044222
HighPow Impedance: 67 Ohm
Implantable Lead Implant Date: 20160628
Implantable Lead Implant Date: 20160628
Implantable Lead Implant Date: 20160628
Implantable Lead Location: 753858
Implantable Lead Location: 753859
Implantable Lead Location: 753860
Implantable Lead Model: 3830
Implantable Lead Model: 5076
Implantable Pulse Generator Implant Date: 20160628
Lead Channel Impedance Value: 228 Ohm
Lead Channel Impedance Value: 285 Ohm
Lead Channel Impedance Value: 361 Ohm
Lead Channel Impedance Value: 418 Ohm
Lead Channel Impedance Value: 456 Ohm
Lead Channel Impedance Value: 456 Ohm
Lead Channel Pacing Threshold Amplitude: 0.625 V
Lead Channel Pacing Threshold Amplitude: 0.875 V
Lead Channel Pacing Threshold Pulse Width: 0.4 ms
Lead Channel Pacing Threshold Pulse Width: 0.8 ms
Lead Channel Sensing Intrinsic Amplitude: 1.625 mV
Lead Channel Sensing Intrinsic Amplitude: 1.625 mV
Lead Channel Sensing Intrinsic Amplitude: 7.625 mV
Lead Channel Sensing Intrinsic Amplitude: 7.625 mV
Lead Channel Setting Pacing Amplitude: 0.5 V
Lead Channel Setting Pacing Amplitude: 2 V
Lead Channel Setting Pacing Amplitude: 2.5 V
Lead Channel Setting Pacing Pulse Width: 0.03 ms
Lead Channel Setting Pacing Pulse Width: 0.8 ms
Lead Channel Setting Sensing Sensitivity: 0.3 mV

## 2019-07-28 ENCOUNTER — Telehealth: Payer: Self-pay

## 2019-07-28 NOTE — Telephone Encounter (Signed)
Patient/Paramedic called concerning that his CRT-D was alarming with a tone/vibrating when moving treadmill. Patient states he laid down and called ems. Patient denied any complaint prior to tone of chest pain, shortness of breath, dizziness or any other complaints. Patient reports he felt fine prior to the tone going off. Patient sent manual transmission, reviewed and consulted with Medtronic. Patient advised per Medtronic his device does not vibrate and when he comes close to a magnet it will tone. Presenting rhythm AP/BIV-P 70's. Battery 8 months remaining, impedance, threshold and sensing WNL. No episodes and therapies noted. Discussion shock plan with patient. Advised patient if he has any further questions or concerns to please call DC back. Verbalizes understanding.

## 2019-08-07 ENCOUNTER — Ambulatory Visit (INDEPENDENT_AMBULATORY_CARE_PROVIDER_SITE_OTHER): Payer: 59 | Admitting: *Deleted

## 2019-08-07 DIAGNOSIS — I429 Cardiomyopathy, unspecified: Secondary | ICD-10-CM

## 2019-08-07 LAB — CUP PACEART REMOTE DEVICE CHECK
Battery Remaining Longevity: 7 mo
Battery Voltage: 2.82 V
Brady Statistic AP VP Percent: 99.83 %
Brady Statistic AP VS Percent: 0.07 %
Brady Statistic AS VP Percent: 0.09 %
Brady Statistic AS VS Percent: 0.01 %
Brady Statistic RA Percent Paced: 99.83 %
Brady Statistic RV Percent Paced: 99.67 %
Date Time Interrogation Session: 20210513063427
HighPow Impedance: 65 Ohm
Implantable Lead Implant Date: 20160628
Implantable Lead Implant Date: 20160628
Implantable Lead Implant Date: 20160628
Implantable Lead Location: 753858
Implantable Lead Location: 753859
Implantable Lead Location: 753860
Implantable Lead Model: 3830
Implantable Lead Model: 5076
Implantable Pulse Generator Implant Date: 20160628
Lead Channel Impedance Value: 247 Ohm
Lead Channel Impedance Value: 304 Ohm
Lead Channel Impedance Value: 342 Ohm
Lead Channel Impedance Value: 418 Ohm
Lead Channel Impedance Value: 456 Ohm
Lead Channel Impedance Value: 456 Ohm
Lead Channel Pacing Threshold Amplitude: 0.625 V
Lead Channel Pacing Threshold Amplitude: 0.875 V
Lead Channel Pacing Threshold Pulse Width: 0.4 ms
Lead Channel Pacing Threshold Pulse Width: 0.8 ms
Lead Channel Sensing Intrinsic Amplitude: 1 mV
Lead Channel Sensing Intrinsic Amplitude: 1 mV
Lead Channel Sensing Intrinsic Amplitude: 7.625 mV
Lead Channel Sensing Intrinsic Amplitude: 7.625 mV
Lead Channel Setting Pacing Amplitude: 0.5 V
Lead Channel Setting Pacing Amplitude: 2 V
Lead Channel Setting Pacing Amplitude: 2.5 V
Lead Channel Setting Pacing Pulse Width: 0.03 ms
Lead Channel Setting Pacing Pulse Width: 0.8 ms
Lead Channel Setting Sensing Sensitivity: 0.3 mV

## 2019-08-11 NOTE — Progress Notes (Signed)
Remote ICD transmission.   

## 2019-09-04 ENCOUNTER — Other Ambulatory Visit (HOSPITAL_COMMUNITY): Payer: Self-pay | Admitting: Cardiology

## 2019-09-06 ENCOUNTER — Other Ambulatory Visit (HOSPITAL_COMMUNITY): Payer: Self-pay | Admitting: Cardiology

## 2019-09-15 ENCOUNTER — Ambulatory Visit (INDEPENDENT_AMBULATORY_CARE_PROVIDER_SITE_OTHER): Payer: 59 | Admitting: *Deleted

## 2019-09-15 DIAGNOSIS — I440 Atrioventricular block, first degree: Secondary | ICD-10-CM

## 2019-09-16 LAB — CUP PACEART REMOTE DEVICE CHECK
Battery Remaining Longevity: 6 mo
Battery Voltage: 2.81 V
Brady Statistic AP VP Percent: 92.41 %
Brady Statistic AP VS Percent: 0.13 %
Brady Statistic AS VP Percent: 7.38 %
Brady Statistic AS VS Percent: 0.08 %
Brady Statistic RA Percent Paced: 92.22 %
Brady Statistic RV Percent Paced: 99.32 %
Date Time Interrogation Session: 20210621232611
HighPow Impedance: 69 Ohm
Implantable Lead Implant Date: 20160628
Implantable Lead Implant Date: 20160628
Implantable Lead Implant Date: 20160628
Implantable Lead Location: 753858
Implantable Lead Location: 753859
Implantable Lead Location: 753860
Implantable Lead Model: 3830
Implantable Lead Model: 5076
Implantable Pulse Generator Implant Date: 20160628
Lead Channel Impedance Value: 247 Ohm
Lead Channel Impedance Value: 304 Ohm
Lead Channel Impedance Value: 399 Ohm
Lead Channel Impedance Value: 418 Ohm
Lead Channel Impedance Value: 456 Ohm
Lead Channel Impedance Value: 475 Ohm
Lead Channel Pacing Threshold Amplitude: 0.625 V
Lead Channel Pacing Threshold Amplitude: 0.875 V
Lead Channel Pacing Threshold Pulse Width: 0.4 ms
Lead Channel Pacing Threshold Pulse Width: 0.8 ms
Lead Channel Sensing Intrinsic Amplitude: 2 mV
Lead Channel Sensing Intrinsic Amplitude: 2 mV
Lead Channel Sensing Intrinsic Amplitude: 7.125 mV
Lead Channel Sensing Intrinsic Amplitude: 7.125 mV
Lead Channel Setting Pacing Amplitude: 0.5 V
Lead Channel Setting Pacing Amplitude: 2 V
Lead Channel Setting Pacing Amplitude: 2.5 V
Lead Channel Setting Pacing Pulse Width: 0.03 ms
Lead Channel Setting Pacing Pulse Width: 0.8 ms
Lead Channel Setting Sensing Sensitivity: 0.3 mV

## 2019-09-17 NOTE — Progress Notes (Signed)
Remote ICD transmission.   

## 2019-09-17 NOTE — Addendum Note (Signed)
Addended by: Elease Etienne A on: 09/17/2019 08:38 AM   Modules accepted: Level of Service

## 2019-09-22 NOTE — Progress Notes (Signed)
Electrophysiology Office Note Date: 09/23/2019  ID:  Donald, Grant 1975/09/27, MRN [id number]  PCP: Derinda Late, MD Primary Cardiologist: No primary care provider on file. Electrophysiologist: Thompson Grayer, MD   CC: Routine ICD follow-up  DEIONTAE Donald Grant is a 44 y.o. male seen today for Thompson Grayer, MD for routine electrophysiology followup.  Since last being seen in our clinic the patient reports doing very well overall. He had contact with a magnet while moving a treadmill, but denies any symptoms.  he denies chest pain, palpitations, dyspnea, PND, orthopnea, nausea, vomiting, dizziness, syncope, edema, weight gain, or early satiety. He has not had ICD shocks.   Device History: Medtronic BiV ICD (LV as HIS lead rather than CS lead) implanted 09/22/2014 for chronic systolic CHF and AV block   Past Medical History:  Diagnosis Date  . Anxiety   . Atrial fibrillation ([place])    PVI ablatioin at [facility] 04/21/14 and May 2016 Dr. Lurene Shadow  . Atrial flutter [facility])    CTI ablation by Dr Lovena Le 02/24/11  . Atrial tachycardia (HCC)    mapped to AV node by Dr Lovena Le, ablation not performed  . Cardiomyopathy, nonischemic ([place])    Medtronic BIVE ICD   . Dyslipidemia   . First degree AV block   . GERD (gastroesophageal reflux disease)   . Gout    bilateral feet  . Hx of cardiovascular stress test    Lexiscan Myoview (07/2013):  Inf and inferolateral scar, no ischemia, no gated  . Lymphadenopathy of right cervical region ?Mono 04/16/2013  . Premature ventricular contraction    Past Surgical History:  Procedure Laterality Date  . ABLATION OF DYSRHYTHMIC FOCUS  07/13/15   Atypical atrial flutter ablation at [facility] by Dr Lurene Shadow  . ATRIAL ABLATION SURGERY  02/24/11   CTI ablation by Dr Lovena Le  . ATRIAL FIBRILLATION ABLATION  04/24/14, 08/13/14   PVI at [place] by Dr Lurene Shadow, [place] with posterior wall Box and FIRM ablation performed  . ATRIAL FLUTTER ABLATION N/A 02/24/2011   Procedure: ATRIAL  FLUTTER ABLATION;  Surgeon: Evans Lance, MD;  Location: [facility] CATH LAB;  Service: Cardiovascular;  Laterality: N/A;  . CARDIAC DEFIBRILLATOR PLACEMENT    . CARDIOVERSION  03/29/2011   Procedure: CARDIOVERSION;  Surgeon: Lelon Perla, MD;  Location: [place];  Service: Cardiovascular;  Laterality: N/A;  . CARDIOVERSION N/A 02/25/2015   Procedure: CARDIOVERSION;  Surgeon: Pixie Casino, MD;  Location: [facility] ENDOSCOPY;  Service: Cardiovascular;  Laterality: N/A;  . CARDIOVERSION (BEDSIDE)  03/29/2011      . ELECTROPHYSIOLOGIC STUDY N/A 06/21/2015   Procedure: Cardioversion;  Surgeon: Evans Lance, MD;  Location: [place] CV LAB;  Service: Cardiovascular;  Laterality: N/A;  . EVALUATION UNDER ANESTHESIA WITH HEMORRHOIDECTOMY N/A 02/05/2017   Procedure: EXAM UNDER ANESTHESIA;  Surgeon: Sojourner Behringer Boston, MD;  Location: [facility] ORS;  Service: General;  Laterality: N/A;  . EXAMINATION UNDER ANESTHESIA  12/19/12   Anal Fistula  . FISTULOTOMY N/A 02/05/2017   Procedure: [place];  Surgeon: Issai Werling Boston, MD;  Location: [facility] ORS;  Service: General;  Laterality: N/A;  . MOUTH SURGERY    . PACEMAKER IMPLANT    . ROOT CANAL      Current Outpatient Medications  Medication Sig Dispense Refill  . acyclovir (ZOVIRAX) 400 MG tablet Take 400 mg by mouth 2 (two) times daily as needed (outbreak).     [name] allopurinol (ZYLOPRIM) 300 MG tablet Take 300 mg by mouth every  morning.     [name] ALPRAZolam (XANAX) 0.5 MG tablet Take 0.25 tablets by mouth daily as needed for anxiety.   1  . calcium carbonate (TUMS - DOSED IN MG ELEMENTAL CALCIUM) 500 MG chewable tablet Chew 2-3 tablets by mouth daily as needed for indigestion or heartburn.    . carvedilol (COREG) 6.25 MG tablet TAKE 1 TABLET BY MOUTH TWICE A DAY 180 tablet 3  . Cholecalciferol (VITAMIN D) 2000 units CAPS Take 4,000 Units by mouth daily.    . dabigatran (PRADAXA) 150 MG CAPS capsule Take 1 capsule (150 mg total) by mouth 2 (two) times daily.  180 capsule 1  . dofetilide (TIKOSYN) 500 MCG capsule TAKE 1 CAPSULE BY MOUTH TWICE A DAY 180 capsule 3  . fish oil-omega-3 fatty acids 1000 MG capsule Take 2 capsules by mouth 2 (two) times daily.     . magnesium oxide (MAG-OX) 400 (241.3 Mg) MG tablet Take 400 mg by mouth every morning.   11  . Multiple Vitamin (MULITIVITAMIN WITH MINERALS) TABS Take 1 tablet by mouth every evening.     . Psyllium (METAMUCIL PO) Take by mouth. Use 2 teaspoons into water once daily    . rosuvastatin (CRESTOR) 20 MG tablet daily.    . sacubitril-valsartan (ENTRESTO) 49-51 MG Take 1 tablet by mouth 2 (two) times daily. Needs appt for further refills 180 tablet 0  . spironolactone (ALDACTONE) 25 MG tablet Take 0.5 tablets (12.5 mg total) by mouth daily. Needs appt for further refills 45 tablet 0  . vitamin B-12 (CYANOCOBALAMIN) 1000 MCG tablet Take 1,000 mcg by mouth at bedtime.     No current facility-administered medications for this visit.    Allergies:   Patient has no known allergies.   Social History: Social History   Socioeconomic History  . Marital status: Married    Spouse name: Not on file  . Number of children: Not on file  . Years of education: Not on file  . Highest education level: Not on file  Occupational History  . Not on file  Tobacco Use  . Smoking status: Former Smoker    Quit date: 08/28/2010    Years since quitting: 9.0  . Smokeless tobacco: Never Used  Substance and Sexual Activity  . Alcohol use: Yes    Comment: 3-4 times weekly  . Drug use: Yes    Types: Marijuana    Comment: still currently smoking, marijuana twice monthly last time 01/27/17  . Sexual activity: Yes  Other Topics Concern  . Not on file  Social History Narrative   His additional social history is notable that the patient exercises     several times a week, jogging up to 2 miles a day.  With exercise, he     notes that he has lost over 10 pounds.    Social Determinants of Health   Financial Resource  Strain:   . Difficulty of Paying Living Expenses:   Food Insecurity:   . Worried About Programme researcher, broadcasting/film/video in the Last Year:   . Barista in the Last Year:   Transportation Needs:   . Freight forwarder (Medical):   [name] Lack of Transportation (Non-Medical):   Physical Activity:   . Days of Exercise per Week:   . Minutes of Exercise per Session:   Stress:   . Feeling of Stress :   Social Connections:   . Frequency of Communication with Friends and Family:   . Frequency of Social  Gatherings with Friends and Family:   . Attends Religious Services:   . Active Member of Clubs or Organizations:   . Attends Banker Meetings:   [name] Marital Status:   Intimate Partner Violence:   . Fear of Current or Ex-Partner:   . Emotionally Abused:   [name] Physically Abused:   . Sexually Abused:     Family History: Family History  Problem Relation Age of Onset  . Heart disease Mother   . Achondroplasia Mother   . Alcohol abuse Mother   . Sudden death Mother   . Dilated cardiomyopathy Mother   . Sudden death Maternal Grandmother   . Achondroplasia Son   . Sudden death Maternal Aunt   . Other Maternal Aunt        substance abuse    Review of Systems: All other systems reviewed and are otherwise negative except as noted above.   Physical Exam: Vitals:   09/23/19 0912  BP: 130/74  Pulse: 74  SpO2: 96%  Weight: 257 lb (116.6 kg)  Height: 6\' 3"  (1.905 m)     GEN- The patient is well appearing, alert and oriented x 3 today.   HEENT: normocephalic, atraumatic; sclera clear, conjunctiva pink; hearing intact; oropharynx clear; neck supple, no JVP Lymph- no cervical lymphadenopathy Lungs- Clear to ausculation bilaterally, normal work of breathing.  No wheezes, rales, rhonchi Heart- Regular rate and rhythm, no murmurs, rubs or gallops, PMI not laterally displaced GI- soft, non-tender, non-distended, bowel sounds present, no hepatosplenomegaly Extremities- no clubbing,  cyanosis, or edema; DP/PT/radial pulses 2+ bilaterally MS- no significant deformity or atrophy Skin- warm and dry, no rash or lesion; ICD pocket well healed Psych- euthymic mood, full affect Neuro- strength and sensation are intact  ICD interrogation- reviewed in detail today,  See PACEART report  EKG:  EKG is ordered today. The ekg ordered today shows NSR at 74 bpm, V pacing. QRS 126 ms  Recent Labs: 03/17/2019: BUN 10; Creatinine, Ser 0.73; Hemoglobin 14.3; Magnesium 2.0; Platelets 235; Potassium 4.3; Sodium 140   Wt Readings from Last 3 Encounters:  09/23/19 257 lb (116.6 kg)  03/17/19 265 lb 12.8 oz (120.6 kg)  12/18/18 255 lb (115.7 kg)     Other studies Reviewed: Additional studies/ records that were reviewed today include: Previous EP office notes, remote reports   Assessment and Plan:  1.  Chronic systolic dysfunction and AV block s/p Medtronic CRT-D (LV in HIS) euvolemic today Stable on an appropriate medical regimen Normal ICD function See Pace Art report No changes today Echo 08/20/18 LVEF 45-50%.  6 months until ERI  2. Persistent AF Maintaining NSR on Tikosyn. QTc stable. BMET and Mg today He has preferred to continue Pradaxa with CHA2DS2VASC of at least 1.    3. ETOH  Encouraged avoidance  4. High risk medication surveillance, tikosyn BMET and Mg today.  EKG stable.  Current medicines are reviewed at length with the patient today.   The patient does not have concerns regarding his medicines.  The following changes were made today:  none  Labs/ tests ordered today include:  Orders Placed This Encounter  Procedures  . Basic Metabolic Panel (BMET)  . Magnesium  . EKG 12-Lead   Disposition:   Follow up with Dr. 08/22/18 in 6 months for follow up and to discuss gen change.   Johney Frame, PA-C  09/23/2019 9:44 AM  [facility] [address] [place] [phone] (office) [phone]  (fax)

## 2019-09-23 ENCOUNTER — Ambulatory Visit (INDEPENDENT_AMBULATORY_CARE_PROVIDER_SITE_OTHER): Payer: 59 | Admitting: Student

## 2019-09-23 ENCOUNTER — Other Ambulatory Visit: Payer: Self-pay

## 2019-09-23 ENCOUNTER — Encounter: Payer: Self-pay | Admitting: Student

## 2019-09-23 VITALS — BP 130/74 | HR 74 | Ht 75.0 in | Wt 257.0 lb

## 2019-09-23 DIAGNOSIS — I4811 Longstanding persistent atrial fibrillation: Secondary | ICD-10-CM | POA: Diagnosis not present

## 2019-09-23 DIAGNOSIS — I4819 Other persistent atrial fibrillation: Secondary | ICD-10-CM | POA: Diagnosis not present

## 2019-09-23 DIAGNOSIS — Z79899 Other long term (current) drug therapy: Secondary | ICD-10-CM

## 2019-09-23 DIAGNOSIS — I440 Atrioventricular block, first degree: Secondary | ICD-10-CM | POA: Diagnosis not present

## 2019-09-23 DIAGNOSIS — I428 Other cardiomyopathies: Secondary | ICD-10-CM

## 2019-09-23 DIAGNOSIS — Z95810 Presence of automatic (implantable) cardiac defibrillator: Secondary | ICD-10-CM

## 2019-09-23 LAB — CUP PACEART INCLINIC DEVICE CHECK
Battery Remaining Longevity: 6 mo
Battery Voltage: 2.81 V
Brady Statistic AP VP Percent: 97.3 %
Brady Statistic AP VS Percent: 0.44 %
Brady Statistic AS VP Percent: 2.23 %
Brady Statistic AS VS Percent: 0.03 %
Brady Statistic RA Percent Paced: 97.57 %
Brady Statistic RV Percent Paced: 98.94 %
Date Time Interrogation Session: 20210629094027
HighPow Impedance: 68 Ohm
Implantable Lead Implant Date: 20160628
Implantable Lead Implant Date: 20160628
Implantable Lead Implant Date: 20160628
Implantable Lead Location: 753858
Implantable Lead Location: 753859
Implantable Lead Location: 753860
Implantable Lead Model: 3830
Implantable Lead Model: 5076
Implantable Pulse Generator Implant Date: 20160628
Lead Channel Impedance Value: 247 Ohm
Lead Channel Impedance Value: 304 Ohm
Lead Channel Impedance Value: 399 Ohm
Lead Channel Impedance Value: 456 Ohm
Lead Channel Impedance Value: 475 Ohm
Lead Channel Impedance Value: 475 Ohm
Lead Channel Pacing Threshold Amplitude: 0.625 V
Lead Channel Pacing Threshold Amplitude: 0.875 V
Lead Channel Pacing Threshold Pulse Width: 0.4 ms
Lead Channel Pacing Threshold Pulse Width: 0.8 ms
Lead Channel Sensing Intrinsic Amplitude: 2.125 mV
Lead Channel Sensing Intrinsic Amplitude: 3.25 mV
Lead Channel Sensing Intrinsic Amplitude: 7.125 mV
Lead Channel Sensing Intrinsic Amplitude: 7.125 mV
Lead Channel Setting Pacing Amplitude: 0.5 V
Lead Channel Setting Pacing Amplitude: 2 V
Lead Channel Setting Pacing Amplitude: 2.5 V
Lead Channel Setting Pacing Pulse Width: 0.03 ms
Lead Channel Setting Pacing Pulse Width: 0.8 ms
Lead Channel Setting Sensing Sensitivity: 0.3 mV

## 2019-09-23 LAB — BASIC METABOLIC PANEL
BUN/Creatinine Ratio: 18 (ref 9–20)
BUN: 12 mg/dL (ref 6–24)
CO2: 22 mmol/L (ref 20–29)
Calcium: 9 mg/dL (ref 8.7–10.2)
Chloride: 100 mmol/L (ref 96–106)
Creatinine, Ser: 0.68 mg/dL — ABNORMAL LOW (ref 0.76–1.27)
GFR calc Af Amer: 134 mL/min/{1.73_m2} (ref >59)
GFR calc non Af Amer: 116 mL/min/{1.73_m2} (ref >59)
Glucose: 106 mg/dL — ABNORMAL HIGH (ref 65–99)
Potassium: 4.4 mmol/L (ref 3.5–5.2)
Sodium: 138 mmol/L (ref 134–144)

## 2019-09-23 LAB — MAGNESIUM: Magnesium: 2 mg/dL (ref 1.6–2.3)

## 2019-09-23 NOTE — Patient Instructions (Signed)
Medication Instructions:  *If you need a refill on your cardiac medications before your next appointment, please call your pharmacy*  Lab Work: Your physician has recommended that you have lab work today: BMET and Magnesium Level If you have labs (blood work) drawn today and your tests are completely normal, you will receive your results only by:  MyChart Message (if you have MyChart) OR  A paper copy in the mail If you have any lab test that is abnormal or we need to change your treatment, we will call you to review the results.  Testing/Procedures: None Ordered  Follow-Up: At [facility], you and your health needs are our priority.  As part of our continuing mission to provide you with exceptional heart care, we have created designated Provider Care Teams.  These Care Teams include your primary Cardiologist (physician) and Advanced Practice Providers (APPs -  Physician Assistants and Nurse Practitioners) who all work together to provide you with the care you need, when you need it.  We recommend signing up for the patient portal called "MyChart".  Sign up information is provided on this After Visit Summary.  MyChart is used to connect with patients for Virtual Visits (Telemedicine).  Patients are able to view lab/test results, encounter notes, upcoming appointments, etc.  Non-urgent messages can be sent to your provider as well.   To learn more about what you can do with MyChart, go to [email or web address].    Your next appointment:   Your physician wants you to follow-up in: 6 MONTHS with Dr. Johney Frame. You will receive a reminder letter in the mail two months in advance. If you don't receive a letter, please call our office to schedule the follow-up appointment.  Remote monitoring is used to monitor your ICD from home. This monitoring reduces the number of office visits required to check your device to one time per year. It allows [place] to keep an eye on the functioning of your device to  ensure it is working properly. You are scheduled for a device check from home on 10/13/19. You may send your transmission at any time that day. If you have a wireless device, the transmission will be sent automatically. After your physician reviews your transmission, you will receive a postcard with your next transmission date.  The format for your next appointment:   In Person with [name], MD

## 2019-10-03 ENCOUNTER — Other Ambulatory Visit (HOSPITAL_COMMUNITY): Payer: Self-pay | Admitting: Cardiology

## 2019-10-13 ENCOUNTER — Ambulatory Visit (INDEPENDENT_AMBULATORY_CARE_PROVIDER_SITE_OTHER): Payer: 59 | Admitting: *Deleted

## 2019-10-13 DIAGNOSIS — I428 Other cardiomyopathies: Secondary | ICD-10-CM

## 2019-10-14 LAB — CUP PACEART REMOTE DEVICE CHECK
Battery Remaining Longevity: 6 mo
Battery Voltage: 2.81 V
Brady Statistic AP VP Percent: 91.08 %
Brady Statistic AP VS Percent: 0.16 %
Brady Statistic AS VP Percent: 8.69 %
Brady Statistic AS VS Percent: 0.07 %
Brady Statistic RA Percent Paced: 90.99 %
Brady Statistic RV Percent Paced: 99.42 %
Date Time Interrogation Session: 20210719022823
HighPow Impedance: 74 Ohm
Implantable Lead Implant Date: 20160628
Implantable Lead Implant Date: 20160628
Implantable Lead Implant Date: 20160628
Implantable Lead Location: 753858
Implantable Lead Location: 753859
Implantable Lead Location: 753860
Implantable Lead Model: 3830
Implantable Lead Model: 5076
Implantable Pulse Generator Implant Date: 20160628
Lead Channel Impedance Value: 247 Ohm
Lead Channel Impedance Value: 304 Ohm
Lead Channel Impedance Value: 361 Ohm
Lead Channel Impedance Value: 456 Ohm
Lead Channel Impedance Value: 475 Ohm
Lead Channel Impedance Value: 513 Ohm
Lead Channel Pacing Threshold Amplitude: 0.625 V
Lead Channel Pacing Threshold Amplitude: 0.875 V
Lead Channel Pacing Threshold Pulse Width: 0.4 ms
Lead Channel Pacing Threshold Pulse Width: 0.8 ms
Lead Channel Sensing Intrinsic Amplitude: 2.125 mV
Lead Channel Sensing Intrinsic Amplitude: 2.125 mV
Lead Channel Sensing Intrinsic Amplitude: 7.375 mV
Lead Channel Sensing Intrinsic Amplitude: 7.375 mV
Lead Channel Setting Pacing Amplitude: 0.5 V
Lead Channel Setting Pacing Amplitude: 2 V
Lead Channel Setting Pacing Amplitude: 2.5 V
Lead Channel Setting Pacing Pulse Width: 0.03 ms
Lead Channel Setting Pacing Pulse Width: 0.8 ms
Lead Channel Setting Sensing Sensitivity: 0.3 mV

## 2019-10-15 NOTE — Addendum Note (Signed)
Addended by: Geralyn Flash D on: 10/15/2019 10:12 AM   Modules accepted: Level of Service

## 2019-10-15 NOTE — Progress Notes (Signed)
Remote ICD transmission.   

## 2019-11-06 ENCOUNTER — Ambulatory Visit (INDEPENDENT_AMBULATORY_CARE_PROVIDER_SITE_OTHER): Payer: 59 | Admitting: *Deleted

## 2019-11-06 DIAGNOSIS — I440 Atrioventricular block, first degree: Secondary | ICD-10-CM

## 2019-11-06 LAB — CUP PACEART REMOTE DEVICE CHECK
Battery Remaining Longevity: 4 mo
Battery Voltage: 2.79 V
Brady Statistic AP VP Percent: 93.92 %
Brady Statistic AP VS Percent: 0.23 %
Brady Statistic AS VP Percent: 5.81 %
Brady Statistic AS VS Percent: 0.04 %
Brady Statistic RA Percent Paced: 93.93 %
Brady Statistic RV Percent Paced: 99.5 %
Date Time Interrogation Session: 20210812001603
HighPow Impedance: 77 Ohm
Implantable Lead Implant Date: 20160628
Implantable Lead Implant Date: 20160628
Implantable Lead Implant Date: 20160628
Implantable Lead Location: 753858
Implantable Lead Location: 753859
Implantable Lead Location: 753860
Implantable Lead Model: 3830
Implantable Lead Model: 5076
Implantable Pulse Generator Implant Date: 20160628
Lead Channel Impedance Value: 285 Ohm
Lead Channel Impedance Value: 342 Ohm
Lead Channel Impedance Value: 399 Ohm
Lead Channel Impedance Value: 475 Ohm
Lead Channel Impedance Value: 513 Ohm
Lead Channel Impedance Value: 513 Ohm
Lead Channel Pacing Threshold Amplitude: 0.625 V
Lead Channel Pacing Threshold Amplitude: 0.875 V
Lead Channel Pacing Threshold Pulse Width: 0.4 ms
Lead Channel Pacing Threshold Pulse Width: 0.8 ms
Lead Channel Sensing Intrinsic Amplitude: 2.5 mV
Lead Channel Sensing Intrinsic Amplitude: 2.5 mV
Lead Channel Sensing Intrinsic Amplitude: 7.375 mV
Lead Channel Sensing Intrinsic Amplitude: 7.375 mV
Lead Channel Setting Pacing Amplitude: 0.5 V
Lead Channel Setting Pacing Amplitude: 2 V
Lead Channel Setting Pacing Amplitude: 2.5 V
Lead Channel Setting Pacing Pulse Width: 0.03 ms
Lead Channel Setting Pacing Pulse Width: 0.8 ms
Lead Channel Setting Sensing Sensitivity: 0.3 mV

## 2019-11-10 NOTE — Progress Notes (Signed)
Remote ICD transmission.   

## 2019-12-15 ENCOUNTER — Ambulatory Visit (INDEPENDENT_AMBULATORY_CARE_PROVIDER_SITE_OTHER): Payer: 59 | Admitting: *Deleted

## 2019-12-15 DIAGNOSIS — I440 Atrioventricular block, first degree: Secondary | ICD-10-CM

## 2019-12-16 LAB — CUP PACEART REMOTE DEVICE CHECK
Battery Remaining Longevity: 3 mo
Battery Voltage: 2.78 V
Brady Statistic AP VP Percent: 92.34 %
Brady Statistic AP VS Percent: 0.11 %
Brady Statistic AS VP Percent: 7.51 %
Brady Statistic AS VS Percent: 0.05 %
Brady Statistic RA Percent Paced: 92.27 %
Brady Statistic RV Percent Paced: 99.65 %
Date Time Interrogation Session: 20210920044224
HighPow Impedance: 62 Ohm
Implantable Lead Implant Date: 20160628
Implantable Lead Implant Date: 20160628
Implantable Lead Implant Date: 20160628
Implantable Lead Location: 753858
Implantable Lead Location: 753859
Implantable Lead Location: 753860
Implantable Lead Model: 3830
Implantable Lead Model: 5076
Implantable Pulse Generator Implant Date: 20160628
Lead Channel Impedance Value: 247 Ohm
Lead Channel Impedance Value: 285 Ohm
Lead Channel Impedance Value: 342 Ohm
Lead Channel Impedance Value: 399 Ohm
Lead Channel Impedance Value: 418 Ohm
Lead Channel Impedance Value: 456 Ohm
Lead Channel Pacing Threshold Amplitude: 0.625 V
Lead Channel Pacing Threshold Amplitude: 0.875 V
Lead Channel Pacing Threshold Pulse Width: 0.4 ms
Lead Channel Pacing Threshold Pulse Width: 0.8 ms
Lead Channel Sensing Intrinsic Amplitude: 2 mV
Lead Channel Sensing Intrinsic Amplitude: 2 mV
Lead Channel Sensing Intrinsic Amplitude: 6.75 mV
Lead Channel Sensing Intrinsic Amplitude: 6.75 mV
Lead Channel Setting Pacing Amplitude: 0.5 V
Lead Channel Setting Pacing Amplitude: 2 V
Lead Channel Setting Pacing Amplitude: 2.5 V
Lead Channel Setting Pacing Pulse Width: 0.03 ms
Lead Channel Setting Pacing Pulse Width: 0.8 ms
Lead Channel Setting Sensing Sensitivity: 0.3 mV

## 2019-12-17 NOTE — Addendum Note (Signed)
Addended by: Elease Etienne A on: 12/17/2019 10:56 AM   Modules accepted: Level of Service

## 2019-12-17 NOTE — Progress Notes (Signed)
Remote ICD transmission.   

## 2020-01-06 ENCOUNTER — Telehealth: Payer: Self-pay | Admitting: Internal Medicine

## 2020-01-06 NOTE — Telephone Encounter (Signed)
Patient is wanting access to his genetic records. Please call.

## 2020-01-12 ENCOUNTER — Ambulatory Visit (INDEPENDENT_AMBULATORY_CARE_PROVIDER_SITE_OTHER): Payer: 59

## 2020-01-12 DIAGNOSIS — I440 Atrioventricular block, first degree: Secondary | ICD-10-CM

## 2020-01-13 LAB — CUP PACEART REMOTE DEVICE CHECK
Battery Remaining Longevity: 3 mo
Battery Voltage: 2.76 V
Brady Statistic AP VP Percent: 99.56 %
Brady Statistic AP VS Percent: 0.38 %
Brady Statistic AS VP Percent: 0.06 %
Brady Statistic AS VS Percent: 0.01 %
Brady Statistic RA Percent Paced: 99.86 %
Brady Statistic RV Percent Paced: 99.26 %
Date Time Interrogation Session: 20211018001604
HighPow Impedance: 75 Ohm
Implantable Lead Implant Date: 20160628
Implantable Lead Implant Date: 20160628
Implantable Lead Implant Date: 20160628
Implantable Lead Location: 753858
Implantable Lead Location: 753859
Implantable Lead Location: 753860
Implantable Lead Model: 3830
Implantable Lead Model: 5076
Implantable Pulse Generator Implant Date: 20160628
Lead Channel Impedance Value: 247 Ohm
Lead Channel Impedance Value: 304 Ohm
Lead Channel Impedance Value: 418 Ohm
Lead Channel Impedance Value: 475 Ohm
Lead Channel Impedance Value: 475 Ohm
Lead Channel Impedance Value: 475 Ohm
Lead Channel Pacing Threshold Amplitude: 0.625 V
Lead Channel Pacing Threshold Amplitude: 0.875 V
Lead Channel Pacing Threshold Pulse Width: 0.4 ms
Lead Channel Pacing Threshold Pulse Width: 0.8 ms
Lead Channel Sensing Intrinsic Amplitude: 1.875 mV
Lead Channel Sensing Intrinsic Amplitude: 1.875 mV
Lead Channel Sensing Intrinsic Amplitude: 6.75 mV
Lead Channel Sensing Intrinsic Amplitude: 6.75 mV
Lead Channel Setting Pacing Amplitude: 0.5 V
Lead Channel Setting Pacing Amplitude: 2 V
Lead Channel Setting Pacing Amplitude: 2.5 V
Lead Channel Setting Pacing Pulse Width: 0.03 ms
Lead Channel Setting Pacing Pulse Width: 0.8 ms
Lead Channel Setting Sensing Sensitivity: 0.3 mV

## 2020-01-16 NOTE — Addendum Note (Signed)
Addended by: Elease Etienne A on: 01/16/2020 04:15 PM   Modules accepted: Level of Service

## 2020-01-16 NOTE — Progress Notes (Signed)
Remote ICD transmission.   

## 2020-01-24 ENCOUNTER — Telehealth: Payer: Self-pay | Admitting: Cardiology

## 2020-01-24 NOTE — Telephone Encounter (Signed)
Pt called stating his device had alarmed.  He is 3 months ERI.  Pt sent transmission and rep reviewed and alarm for ERI.  Pt reassured and I will send message to Dr. Johney Frame so pt could come in and alarm can be deactivated. Pt agreeable.  Reassured device is functioning it is only a safety alarm.

## 2020-01-26 ENCOUNTER — Telehealth: Payer: Self-pay | Admitting: Emergency Medicine

## 2020-01-26 NOTE — Telephone Encounter (Signed)
Patient notified his device has reached RRT. He reports RRT audible alert sounded on 01/24/20. Will schedule twith Dr Johney Frame to discuss gen change and disable alarm at that time. Patient concerned about being out of work after gen change due to physical restrictions. Education done that if no leads replaced he will not have any lifting restrictions.

## 2020-02-01 ENCOUNTER — Other Ambulatory Visit (HOSPITAL_COMMUNITY): Payer: Self-pay | Admitting: Cardiology

## 2020-02-02 ENCOUNTER — Ambulatory Visit: Payer: 59 | Admitting: Internal Medicine

## 2020-02-02 ENCOUNTER — Other Ambulatory Visit: Payer: Self-pay

## 2020-02-02 ENCOUNTER — Encounter: Payer: Self-pay | Admitting: Internal Medicine

## 2020-02-02 ENCOUNTER — Encounter: Payer: Self-pay | Admitting: *Deleted

## 2020-02-02 VITALS — BP 152/92 | HR 88 | Ht 75.0 in | Wt 259.2 lb

## 2020-02-02 DIAGNOSIS — I428 Other cardiomyopathies: Secondary | ICD-10-CM

## 2020-02-02 DIAGNOSIS — I5022 Chronic systolic (congestive) heart failure: Secondary | ICD-10-CM

## 2020-02-02 DIAGNOSIS — I4819 Other persistent atrial fibrillation: Secondary | ICD-10-CM

## 2020-02-02 NOTE — Patient Instructions (Addendum)
Medication Instructions:  Your physician recommends that you continue on your current medications as directed. Please refer to the Current Medication list given to you today.  *If you need a refill on your cardiac medications before your next appointment, please call your pharmacy*  Lab Work: None ordered.  If you have labs (blood work) drawn today and your tests are completely normal, you will receive your results only by: [name] MyChart Message (if you have MyChart) OR . A paper copy in the mail If you have any lab test that is abnormal or we need to change your treatment, we will call you to review the results.  Testing/Procedures: None ordered.  Follow-Up: At [facility], you and your health needs are our priority.  As part of our continuing mission to provide you with exceptional heart care, we have created designated Provider Care Teams.  These Care Teams include your primary Cardiologist (physician) and Advanced Practice Providers (APPs -  Physician Assistants and Nurse Practitioners) who all work together to provide you with the care you need, when you need it.  We recommend signing up for the patient portal called "MyChart".  Sign up information is provided on this After Visit Summary.  MyChart is used to connect with patients for Virtual Visits (Telemedicine).  Patients are able to view lab/test results, encounter notes, upcoming appointments, etc.  Non-urgent messages can be sent to your provider as well.   To learn more about what you can do with MyChart, go to [email or web address].     Remote monitoring is used to monitor your ICD from home. This monitoring reduces the number of office visits required to check your device to one time per year. It allows [place] to keep an eye on the functioning of your device to ensure it is working properly. You are scheduled for a device check from home on 02/05/20 . You may send your transmission at any time that day. If you have a wireless device,  the transmission will be sent automatically. After your physician reviews your transmission, you will receive a postcard with your next transmission date.  Other Instructions:  Cardioverter Defibrillator Implantation, Care After This sheet gives you information about how to care for yourself after your procedure. Your health care provider may also give you more specific instructions. If you have problems or questions, contact your health care provider. What can I expect after the procedure? After the procedure, it is common to have:  Some pain. It may last a few days.  A slight bump over the skin where the device was placed. Sometimes, it is possible to feel the device under the skin. This is normal. During the months and years after your procedure, your health care provider will check the device, the leads, and the battery every few months. Eventually, when the battery is low, the device will be replaced. Follow these instructions at home: Medicines  Take over-the-counter and prescription medicines only as told by your health care provider.  If you were prescribed an antibiotic medicine, take it as told by your health care provider. Do not stop taking the antibiotic even if you start to feel better. Incision care      Follow instructions from your health care provider about how to take care of your incision area. Make sure you: ? Wash your hands with soap and water before you change your bandage (dressing). If soap and water are not available, use hand sanitizer. ? Change your dressing as told by your health care  provider. ? Leave stitches (sutures), skin glue, or adhesive strips in place. These skin closures may need to stay in place for 2 weeks or longer. If adhesive strip edges start to loosen and curl up, you may trim the loose edges. Do not remove adhesive strips completely unless your health care provider tells you to do that.  Check your incision area every day for signs of  infection. Check for: ? More redness, swelling, or pain. ? More fluid or blood. ? Warmth. ? Pus or a bad smell.  Do not use lotions or ointments near the incision area unless told by your health care provider.  Keep the incision area clean and dry for 2-3 days after the procedure or for as long as told by your health care provider. It takes several weeks for the incision site to heal completely.  Do not take baths, swim, or use a hot tub until your health care provider approves. Activity  Try to walk a little every day. Exercising is important after this procedure. Also, use your shoulder on the side of the defibrillator in daily tasks that do not require a lot of motion.  For at least 6 weeks: ? Do not lift your upper arm above your shoulders. This means no tennis, golf, or swimming for this period of time. If you tend to sleep with your arm above your head, use a restraint to prevent this during sleep. ? Avoid sudden jerking, pulling, or chopping movements that pull your upper arm far away from your body.  Ask your health care provider when you may go back to work.  Check with your health care provider before you start to drive or play sports. Electric and magnetic fields  Tell all health care providers that you have a defibrillator. This may prevent them from giving you an MRI scan because strong magnets are used for that test.  If you must pass through a metal detector, quickly walk through it. Do not stop under the detector, and do not stand near it.  Avoid places or objects that have a strong electric or magnetic field, including: ? [employer]. At the airport, let officials know that you have a defibrillator. Your defibrillator ID card will let you be checked in a way that is safe for you and will not damage your defibrillator. Also, do not let a security person wave a magnetic wand near your defibrillator. That can make it stop working. ? Power plants. ? Large  electrical generators. ? Anti-theft systems or electronic article surveillance (EAS). ? Radiofrequency transmission towers, such as cell phone and radio towers.  Do not use amateur (ham) radio equipment or electric (arc) welding torches. Some devices are safe to use if held at least 12 inches (30 cm) from your defibrillator. These include power tools, lawn mowers, and speakers. If you are unsure if something is safe to use, ask your health care provider.  Do not use MP3 player headphones. They have magnets.  You may safely use electric blankets, heating pads, computers, and microwave ovens.  When using your cell phone, hold it to the ear that is on the opposite side from the defibrillator. Do not leave your cell phone in a pocket over the defibrillator. General instructions  Follow diet instructions from your health care provider, if this applies.  Always keep your defibrillator ID card with you. The card should list the implant date, device model, and manufacturer. Consider wearing a medical alert bracelet or necklace.  Have  your defibrillator checked every 3-6 months or as often as told by your health care provider. Most defibrillators last for 4-8 years.  Keep all follow-up visits as told by your health care provider. This is important for your health care provider to make sure your chest is healing the way it should. Ask your health care provider when you should come back to have your stitches or staples taken out. Contact a health care provider if:  You feel one shock in your chest.  You gain weight suddenly.  Your legs or feet swell more than they have before.  It feels like your heart is fluttering or skipping beats (heart palpitations).  You have more redness, swelling, or pain around your incision.  You have more fluid or blood coming from your incision.  Your incision feels warm to the touch.  You have pus or a bad smell coming from your incision.  You have a  fever. Get help right away if:  You have chest pain.  You feel more than one shock.  You feel more short of breath than you have felt before.  You feel more light-headed than you have felt before.  Your incision starts to open up. This information is not intended to replace advice given to you by your health care provider. Make sure you discuss any questions you have with your health care provider. Document Revised: 06/07/2017 Document Reviewed: 08/18/2015 Elsevier Patient Education  2020 [employer].

## 2020-02-02 NOTE — Progress Notes (Signed)
PCP: Mosetta Putt, MD   Primary EP: Dr Johney Frame  Donald Grant is a 44 y.o. male who presents today for routine electrophysiology followup.  Since last being seen in our clinic, the patient reports doing very well.  Today, he denies symptoms of palpitations, chest pain, shortness of breath,  lower extremity edema, dizziness, presyncope, syncope, or ICD shocks.  The patient is otherwise without complaint today.   Past Medical History:  Diagnosis Date  . Anxiety   . Atrial fibrillation (HCC)    PVI ablatioin at [facility] 04/21/14 and May 2016 Dr. [name]  . Atrial flutter [facility])    CTI ablation by Dr [name] 02/24/11  . Atrial tachycardia (HCC)    mapped to AV node by Dr [name], ablation not performed  . Cardiomyopathy, nonischemic (HCC)    Medtronic BIVE ICD   . Dyslipidemia   . First degree AV block   . GERD (gastroesophageal reflux disease)   . Gout    bilateral feet  . Hx of cardiovascular stress test    Lexiscan Myoview (07/2013):  Inf and inferolateral scar, no ischemia, no gated  . Lymphadenopathy of right cervical region ?Mono 04/16/2013  . Premature ventricular contraction    Past Surgical History:  Procedure Laterality Date  . ABLATION OF DYSRHYTHMIC FOCUS  07/13/15   Atypical atrial flutter ablation at [facility] by Dr [name]  . ATRIAL ABLATION SURGERY  02/24/11   CTI ablation by Dr [name]  . ATRIAL FIBRILLATION ABLATION  04/24/14, 08/13/14   PVI at [facility] by Dr [name], PVI with posterior wall Box and FIRM ablation performed  . ATRIAL FLUTTER ABLATION N/A 02/24/2011   Procedure: ATRIAL FLUTTER ABLATION;  Surgeon: [name], MD;  Location: [facility] CATH LAB;  Service: Cardiovascular;  Laterality: N/A;  . CARDIAC DEFIBRILLATOR PLACEMENT    . CARDIOVERSION  03/29/2011   Procedure: CARDIOVERSION;  Surgeon: [name], MD;  Location: [facility] OR;  Service: Cardiovascular;  Laterality: N/A;  . CARDIOVERSION N/A 02/25/2015   Procedure: CARDIOVERSION;  Surgeon: [name], MD;   Location: [facility] ENDOSCOPY;  Service: Cardiovascular;  Laterality: N/A;  . CARDIOVERSION (BEDSIDE)  03/29/2011      . ELECTROPHYSIOLOGIC STUDY N/A 06/21/2015   Procedure: Cardioversion;  Surgeon: [name], MD;  Location: [facility] INVASIVE CV LAB;  Service: Cardiovascular;  Laterality: N/A;  . EVALUATION UNDER ANESTHESIA WITH HEMORRHOIDECTOMY N/A 02/05/2017   Procedure: EXAM UNDER ANESTHESIA;  Surgeon: Karie Soda, MD;  Location: [facility] ORS;  Service: General;  Laterality: N/A;  . EXAMINATION UNDER ANESTHESIA  12/19/12   Anal Fistula  . FISTULOTOMY N/A 02/05/2017   Procedure: REPAIR PERIRECTAL FISTULA ERAS PATHWAY;  Surgeon: Karie Soda, MD;  Location: [facility] ORS;  Service: General;  Laterality: N/A;  . MOUTH SURGERY    . PACEMAKER IMPLANT    . ROOT CANAL      ROS- all systems are reviewed and negative except as per HPI above  Current Outpatient Medications  Medication Sig Dispense Refill  . acyclovir (ZOVIRAX) 400 MG tablet Take 400 mg by mouth 2 (two) times daily as needed (outbreak).     [name] allopurinol (ZYLOPRIM) 300 MG tablet Take 300 mg by mouth every morning.     [name] ALPRAZolam (XANAX) 0.5 MG tablet Take 0.25 tablets by mouth daily as needed for anxiety.   1  . calcium carbonate (TUMS - DOSED IN MG ELEMENTAL CALCIUM) 500 MG chewable tablet Chew 2-3 tablets by mouth daily as needed for indigestion or heartburn.    [name]  carvedilol (COREG) 6.25 MG tablet TAKE 1 TABLET BY MOUTH TWICE A DAY 180 tablet 3  . Cholecalciferol (VITAMIN D) 2000 units CAPS Take 4,000 Units by mouth daily.    . dabigatran (PRADAXA) 150 MG CAPS capsule Take 1 capsule (150 mg total) by mouth 2 (two) times daily. 180 capsule 1  . dofetilide (TIKOSYN) 500 MCG capsule TAKE 1 CAPSULE BY MOUTH TWICE A DAY 180 capsule 3  . fish oil-omega-3 fatty acids 1000 MG capsule Take 2 capsules by mouth 2 (two) times daily.     . magnesium oxide (MAG-OX) 400 (241.3 Mg) MG tablet Take 400 mg by mouth every morning.   11  . Multiple Vitamin  (MULITIVITAMIN WITH MINERALS) TABS Take 1 tablet by mouth every evening.     . Psyllium (METAMUCIL PO) Take by mouth. Use 2 teaspoons into water once daily    . rosuvastatin (CRESTOR) 20 MG tablet daily.    . sacubitril-valsartan (ENTRESTO) 49-51 MG Take 1 tablet by mouth 2 (two) times daily. Needs appt for further refills 180 tablet 0  . spironolactone (ALDACTONE) 25 MG tablet Take 0.5 tablets (12.5 mg total) by mouth daily. MUST BE SEEN IN OFFICE FOR FURTHER REFILLS 45 tablet 0  . vitamin B-12 (CYANOCOBALAMIN) 1000 MCG tablet Take 1,000 mcg by mouth at bedtime.     No current facility-administered medications for this visit.    Physical Exam: Vitals:   02/02/20 1628  BP: (!) 152/92  Pulse: 88  SpO2: 96%  Weight: 259 lb 3.2 oz (117.6 kg)  Height: 6' 3" (1.905 m)    GEN- The patient is well appearing, alert and oriented x 3 today.   Head- normocephalic, atraumatic Eyes-  Sclera clear, conjunctiva pink Ears- hearing intact Oropharynx- clear Lungs- Clear to ausculation bilaterally, normal work of breathing Chest- ICD pocket is well healed Heart- Regular rate and rhythm, no murmurs, rubs or gallops, PMI not laterally displaced GI- soft, NT, ND, + BS Extremities- no clubbing, cyanosis, or edema  ICD interrogation- reviewed in detail today,  See PACEART report  ekg tracing ordered today is personally reviewed and shows sinus with left bundle pacing  Wt Readings from Last 3 Encounters:  02/02/20 259 lb 3.2 oz (117.6 kg)  09/23/19 257 lb (116.6 kg)  03/17/19 265 lb 12.8 oz (120.6 kg)    Assessment and Plan:  1.  Chronic systolic dysfunction/ nonischemic CM/ AV block euvolemic today Stable on an appropriate medical regimen Norma BiVl ICD function See Pace Art report No changes today he is not device dependant today His device has reached ERI. Risks, benefits, and alternatives to BiV ICD pulse generator replacement were discussed in detail today.  The patient understands  that risks include but are not limited to bleeding, infection, pneumothorax, perforation, tamponade, vascular damage, renal failure, MI, stroke, death, inappropriate shocks, damage to his existing leads, and lead dislodgement and wishes to proceed.  We will therefore schedule the procedure at the next available time.  2. Persistent afib Reasonably well controlled with tikosyn chads2vasc score is 1.he is on pradaxa.  Hold pradaxa 24 hours prior to generator change Bmet, mg are ordered He requires close follow-up on tikosyn to avoid toxicity  Risks, benefits and potential toxicities for medications prescribed and/or refilled reviewed with patient today.   Krystalynn Ridgeway MD, FACC 02/02/2020 4:44 PM  

## 2020-02-02 NOTE — H&P (View-Only) (Signed)
PCP: Mosetta Putt, MD   Primary EP: Dr Johney Frame  Donald Grant is a 44 y.o. male who presents today for routine electrophysiology followup.  Since last being seen in our clinic, the patient reports doing very well.  Today, he denies symptoms of palpitations, chest pain, shortness of breath,  lower extremity edema, dizziness, presyncope, syncope, or ICD shocks.  The patient is otherwise without complaint today.   Past Medical History:  Diagnosis Date  . Anxiety   . Atrial fibrillation (HCC)    PVI ablatioin at [facility] 04/21/14 and May 2016 Dr. [name]  . Atrial flutter [facility])    CTI ablation by Dr [name] 02/24/11  . Atrial tachycardia (HCC)    mapped to AV node by Dr [name], ablation not performed  . Cardiomyopathy, nonischemic (HCC)    Medtronic BIVE ICD   . Dyslipidemia   . First degree AV block   . GERD (gastroesophageal reflux disease)   . Gout    bilateral feet  . Hx of cardiovascular stress test    Lexiscan Myoview (07/2013):  Inf and inferolateral scar, no ischemia, no gated  . Lymphadenopathy of right cervical region ?Mono 04/16/2013  . Premature ventricular contraction    Past Surgical History:  Procedure Laterality Date  . ABLATION OF DYSRHYTHMIC FOCUS  07/13/15   Atypical atrial flutter ablation at [facility] by Dr [name]  . ATRIAL ABLATION SURGERY  02/24/11   CTI ablation by Dr [name]  . ATRIAL FIBRILLATION ABLATION  04/24/14, 08/13/14   PVI at [facility] by Dr [name], PVI with posterior wall Box and FIRM ablation performed  . ATRIAL FLUTTER ABLATION N/A 02/24/2011   Procedure: ATRIAL FLUTTER ABLATION;  Surgeon: [name], MD;  Location: [facility] CATH LAB;  Service: Cardiovascular;  Laterality: N/A;  . CARDIAC DEFIBRILLATOR PLACEMENT    . CARDIOVERSION  03/29/2011   Procedure: CARDIOVERSION;  Surgeon: [name], MD;  Location: [facility] OR;  Service: Cardiovascular;  Laterality: N/A;  . CARDIOVERSION N/A 02/25/2015   Procedure: CARDIOVERSION;  Surgeon: [name], MD;   Location: [facility] ENDOSCOPY;  Service: Cardiovascular;  Laterality: N/A;  . CARDIOVERSION (BEDSIDE)  03/29/2011      . ELECTROPHYSIOLOGIC STUDY N/A 06/21/2015   Procedure: Cardioversion;  Surgeon: [name], MD;  Location: [facility] INVASIVE CV LAB;  Service: Cardiovascular;  Laterality: N/A;  . EVALUATION UNDER ANESTHESIA WITH HEMORRHOIDECTOMY N/A 02/05/2017   Procedure: EXAM UNDER ANESTHESIA;  Surgeon: Karie Soda, MD;  Location: [facility] ORS;  Service: General;  Laterality: N/A;  . EXAMINATION UNDER ANESTHESIA  12/19/12   Anal Fistula  . FISTULOTOMY N/A 02/05/2017   Procedure: REPAIR PERIRECTAL FISTULA ERAS PATHWAY;  Surgeon: Karie Soda, MD;  Location: [facility] ORS;  Service: General;  Laterality: N/A;  . MOUTH SURGERY    . PACEMAKER IMPLANT    . ROOT CANAL      ROS- all systems are reviewed and negative except as per HPI above  Current Outpatient Medications  Medication Sig Dispense Refill  . acyclovir (ZOVIRAX) 400 MG tablet Take 400 mg by mouth 2 (two) times daily as needed (outbreak).     [name] allopurinol (ZYLOPRIM) 300 MG tablet Take 300 mg by mouth every morning.     [name] ALPRAZolam (XANAX) 0.5 MG tablet Take 0.25 tablets by mouth daily as needed for anxiety.   1  . calcium carbonate (TUMS - DOSED IN MG ELEMENTAL CALCIUM) 500 MG chewable tablet Chew 2-3 tablets by mouth daily as needed for indigestion or heartburn.    [name]  carvedilol (COREG) 6.25 MG tablet TAKE 1 TABLET BY MOUTH TWICE A DAY 180 tablet 3  . Cholecalciferol (VITAMIN D) 2000 units CAPS Take 4,000 Units by mouth daily.    . dabigatran (PRADAXA) 150 MG CAPS capsule Take 1 capsule (150 mg total) by mouth 2 (two) times daily. 180 capsule 1  . dofetilide (TIKOSYN) 500 MCG capsule TAKE 1 CAPSULE BY MOUTH TWICE A DAY 180 capsule 3  . fish oil-omega-3 fatty acids 1000 MG capsule Take 2 capsules by mouth 2 (two) times daily.     . magnesium oxide (MAG-OX) 400 (241.3 Mg) MG tablet Take 400 mg by mouth every morning.   11  . Multiple Vitamin  (MULITIVITAMIN WITH MINERALS) TABS Take 1 tablet by mouth every evening.     . Psyllium (METAMUCIL PO) Take by mouth. Use 2 teaspoons into water once daily    . rosuvastatin (CRESTOR) 20 MG tablet daily.    . sacubitril-valsartan (ENTRESTO) 49-51 MG Take 1 tablet by mouth 2 (two) times daily. Needs appt for further refills 180 tablet 0  . spironolactone (ALDACTONE) 25 MG tablet Take 0.5 tablets (12.5 mg total) by mouth daily. MUST BE SEEN IN OFFICE FOR FURTHER REFILLS 45 tablet 0  . vitamin B-12 (CYANOCOBALAMIN) 1000 MCG tablet Take 1,000 mcg by mouth at bedtime.     No current facility-administered medications for this visit.    Physical Exam: Vitals:   02/02/20 1628  BP: (!) 152/92  Pulse: 88  SpO2: 96%  Weight: 259 lb 3.2 oz (117.6 kg)  Height: 6\' 3"  (1.905 m)    GEN- The patient is well appearing, alert and oriented x 3 today.   Head- normocephalic, atraumatic Eyes-  Sclera clear, conjunctiva pink Ears- hearing intact Oropharynx- clear Lungs- Clear to ausculation bilaterally, normal work of breathing Chest- ICD pocket is well healed Heart- Regular rate and rhythm, no murmurs, rubs or gallops, PMI not laterally displaced GI- soft, NT, ND, + BS Extremities- no clubbing, cyanosis, or edema  ICD interrogation- reviewed in detail today,  See PACEART report  ekg tracing ordered today is personally reviewed and shows sinus with left bundle pacing  Wt Readings from Last 3 Encounters:  02/02/20 259 lb 3.2 oz (117.6 kg)  09/23/19 257 lb (116.6 kg)  03/17/19 265 lb 12.8 oz (120.6 kg)    Assessment and Plan:  1.  Chronic systolic dysfunction/ nonischemic CM/ AV block euvolemic today Stable on an appropriate medical regimen Norma BiVl ICD function See Pace Art report No changes today he is not device dependant today His device has reached ERI. Risks, benefits, and alternatives to BiV ICD pulse generator replacement were discussed in detail today.  The patient understands  that risks include but are not limited to bleeding, infection, pneumothorax, perforation, tamponade, vascular damage, renal failure, MI, stroke, death, inappropriate shocks, damage to his existing leads, and lead dislodgement and wishes to proceed.  We will therefore schedule the procedure at the next available time.  2. Persistent afib Reasonably well controlled with tikosyn chads2vasc score is 1.he is on pradaxa.  Hold pradaxa 24 hours prior to generator change Bmet, mg are ordered He requires close follow-up on tikosyn to avoid toxicity  Risks, benefits and potential toxicities for medications prescribed and/or refilled reviewed with patient today.   03/19/19 MD, [facility] 02/02/2020 4:44 PM

## 2020-02-05 ENCOUNTER — Ambulatory Visit (INDEPENDENT_AMBULATORY_CARE_PROVIDER_SITE_OTHER): Payer: 59

## 2020-02-05 DIAGNOSIS — Z95810 Presence of automatic (implantable) cardiac defibrillator: Secondary | ICD-10-CM | POA: Diagnosis not present

## 2020-02-05 DIAGNOSIS — I4819 Other persistent atrial fibrillation: Secondary | ICD-10-CM

## 2020-02-05 LAB — CUP PACEART REMOTE DEVICE CHECK
Battery Remaining Longevity: 2 mo
Battery Voltage: 2.72 V
Brady Statistic AP VP Percent: 98.53 %
Brady Statistic AP VS Percent: 1.38 %
Brady Statistic AS VP Percent: 0.09 %
Brady Statistic AS VS Percent: 0 %
Brady Statistic RA Percent Paced: 99.81 %
Brady Statistic RV Percent Paced: 98.15 %
Date Time Interrogation Session: 20211111012305
HighPow Impedance: 72 Ohm
Implantable Lead Implant Date: 20160628
Implantable Lead Implant Date: 20160628
Implantable Lead Implant Date: 20160628
Implantable Lead Location: 753858
Implantable Lead Location: 753859
Implantable Lead Location: 753860
Implantable Lead Model: 3830
Implantable Lead Model: 5076
Implantable Pulse Generator Implant Date: 20160628
Lead Channel Impedance Value: 247 Ohm
Lead Channel Impedance Value: 304 Ohm
Lead Channel Impedance Value: 399 Ohm
Lead Channel Impedance Value: 475 Ohm
Lead Channel Impedance Value: 513 Ohm
Lead Channel Impedance Value: 532 Ohm
Lead Channel Pacing Threshold Amplitude: 0.625 V
Lead Channel Pacing Threshold Amplitude: 0.875 V
Lead Channel Pacing Threshold Pulse Width: 0.4 ms
Lead Channel Pacing Threshold Pulse Width: 0.8 ms
Lead Channel Sensing Intrinsic Amplitude: 1.875 mV
Lead Channel Sensing Intrinsic Amplitude: 2.375 mV
Lead Channel Sensing Intrinsic Amplitude: 6.75 mV
Lead Channel Sensing Intrinsic Amplitude: 8.875 mV
Lead Channel Setting Pacing Amplitude: 0.5 V
Lead Channel Setting Pacing Amplitude: 2 V
Lead Channel Setting Pacing Amplitude: 2.5 V
Lead Channel Setting Pacing Pulse Width: 0.03 ms
Lead Channel Setting Pacing Pulse Width: 0.8 ms
Lead Channel Setting Sensing Sensitivity: 0.3 mV

## 2020-02-09 ENCOUNTER — Other Ambulatory Visit: Payer: 59

## 2020-02-09 ENCOUNTER — Other Ambulatory Visit: Payer: Self-pay

## 2020-02-09 DIAGNOSIS — I428 Other cardiomyopathies: Secondary | ICD-10-CM

## 2020-02-09 DIAGNOSIS — I5022 Chronic systolic (congestive) heart failure: Secondary | ICD-10-CM

## 2020-02-09 DIAGNOSIS — I4819 Other persistent atrial fibrillation: Secondary | ICD-10-CM

## 2020-02-09 NOTE — Progress Notes (Signed)
Remote ICD transmission.   

## 2020-02-10 LAB — CBC WITH DIFFERENTIAL/PLATELET
Basophils Absolute: 0.1 10*3/uL (ref 0.0–0.2)
Basos: 1 %
EOS (ABSOLUTE): 0.2 10*3/uL (ref 0.0–0.4)
Eos: 3 %
Hematocrit: 39.7 % (ref 37.5–51.0)
Hemoglobin: 14.3 g/dL (ref 13.0–17.7)
Immature Grans (Abs): 0 10*3/uL (ref 0.0–0.1)
Immature Granulocytes: 0 %
Lymphocytes Absolute: 1.4 10*3/uL (ref 0.7–3.1)
Lymphs: 19 %
MCH: 32.5 pg (ref 26.6–33.0)
MCHC: 36 g/dL — ABNORMAL HIGH (ref 31.5–35.7)
MCV: 90 fL (ref 79–97)
Monocytes Absolute: 0.7 10*3/uL (ref 0.1–0.9)
Monocytes: 9 %
Neutrophils Absolute: 5.1 10*3/uL (ref 1.4–7.0)
Neutrophils: 68 %
Platelets: 220 10*3/uL (ref 150–450)
RBC: 4.4 x10E6/uL (ref 4.14–5.80)
RDW: 12.2 % (ref 11.6–15.4)
WBC: 7.5 10*3/uL (ref 3.4–10.8)

## 2020-02-10 LAB — BASIC METABOLIC PANEL
BUN/Creatinine Ratio: 11 (ref 9–20)
BUN: 10 mg/dL (ref 6–24)
CO2: 25 mmol/L (ref 20–29)
Calcium: 9.2 mg/dL (ref 8.7–10.2)
Chloride: 103 mmol/L (ref 96–106)
Creatinine, Ser: 0.87 mg/dL (ref 0.76–1.27)
GFR calc Af Amer: 120 mL/min/{1.73_m2} (ref >59)
GFR calc non Af Amer: 104 mL/min/{1.73_m2} (ref >59)
Glucose: 99 mg/dL (ref 65–99)
Potassium: 4.4 mmol/L (ref 3.5–5.2)
Sodium: 140 mmol/L (ref 134–144)

## 2020-02-16 ENCOUNTER — Other Ambulatory Visit (HOSPITAL_COMMUNITY)
Admission: RE | Admit: 2020-02-16 | Discharge: 2020-02-16 | Disposition: A | Discharge status: Home / Self Care | Payer: 59 | Source: Ambulatory Visit | Attending: Internal Medicine | Admitting: Internal Medicine

## 2020-02-16 DIAGNOSIS — Z01812 Encounter for preprocedural laboratory examination: Secondary | ICD-10-CM | POA: Insufficient documentation

## 2020-02-16 DIAGNOSIS — Z20822 Contact with and (suspected) exposure to covid-19: Secondary | ICD-10-CM | POA: Diagnosis not present

## 2020-02-16 LAB — SARS CORONAVIRUS 2 (TAT 6-24 HRS): SARS Coronavirus 2: NEGATIVE

## 2020-02-17 ENCOUNTER — Other Ambulatory Visit (HOSPITAL_COMMUNITY): Payer: Self-pay | Admitting: Cardiology

## 2020-02-17 ENCOUNTER — Telehealth: Payer: Self-pay | Admitting: Internal Medicine

## 2020-02-17 ENCOUNTER — Telehealth (HOSPITAL_COMMUNITY): Payer: Self-pay | Admitting: Cardiology

## 2020-02-17 MED ORDER — ENTRESTO 49-51 MG PO TABS
1.0000 | ORAL_TABLET | Freq: Two times a day (BID) | ORAL | 2 refills | Status: DC
Start: 2020-02-17 — End: 2020-05-25

## 2020-02-17 NOTE — Telephone Encounter (Signed)
Discussed holding Pradaxa for 24 hours prior to procedure tomorrow.  Patient verbalized understanding

## 2020-02-17 NOTE — Telephone Encounter (Signed)
Pt request Entresto refill, pt scheduled appt on 12/27, pt can be reached [phone]

## 2020-02-17 NOTE — Progress Notes (Signed)
Attempted to call patient regarding procedure instructions for tomorrow.  Left voicemail on the following items: Arrival time 1030 Nothing to eat or drink after midnight No meds AM of procedure Responsible person to drive you home and stay with you for 24 hrs Wash with special soap night before and morning of procedure If on anti-coagulant drug instructions Pradaxa, hold for 24 hours

## 2020-02-17 NOTE — Telephone Encounter (Signed)
Patient has a procedure scheduled tomorrow with Dr. Johney Frame. He cant remember if he is supposed to stop his blood thinner today or tomorrow. Please advise.

## 2020-02-18 ENCOUNTER — Other Ambulatory Visit: Payer: Self-pay

## 2020-02-18 ENCOUNTER — Ambulatory Visit (HOSPITAL_COMMUNITY)
Admission: RE | Admit: 2020-02-18 | Discharge: 2020-02-18 | Disposition: A | Discharge status: Home / Self Care | Payer: 59 | Attending: Internal Medicine | Admitting: Internal Medicine

## 2020-02-18 ENCOUNTER — Ambulatory Visit (HOSPITAL_COMMUNITY)
Admission: RE | Disposition: A | Discharge status: Home / Self Care | Payer: Self-pay | Source: Home / Self Care | Attending: Internal Medicine

## 2020-02-18 DIAGNOSIS — Z006 Encounter for examination for normal comparison and control in clinical research program: Secondary | ICD-10-CM | POA: Diagnosis not present

## 2020-02-18 DIAGNOSIS — I495 Sick sinus syndrome: Secondary | ICD-10-CM | POA: Diagnosis not present

## 2020-02-18 DIAGNOSIS — Z79899 Other long term (current) drug therapy: Secondary | ICD-10-CM | POA: Insufficient documentation

## 2020-02-18 DIAGNOSIS — Z4502 Encounter for adjustment and management of automatic implantable cardiac defibrillator: Secondary | ICD-10-CM | POA: Diagnosis not present

## 2020-02-18 DIAGNOSIS — I509 Heart failure, unspecified: Secondary | ICD-10-CM | POA: Diagnosis not present

## 2020-02-18 DIAGNOSIS — I428 Other cardiomyopathies: Secondary | ICD-10-CM | POA: Insufficient documentation

## 2020-02-18 DIAGNOSIS — I441 Atrioventricular block, second degree: Secondary | ICD-10-CM | POA: Diagnosis not present

## 2020-02-18 DIAGNOSIS — I4819 Other persistent atrial fibrillation: Secondary | ICD-10-CM | POA: Insufficient documentation

## 2020-02-18 DIAGNOSIS — Z7901 Long term (current) use of anticoagulants: Secondary | ICD-10-CM | POA: Insufficient documentation

## 2020-02-18 DIAGNOSIS — I5022 Chronic systolic (congestive) heart failure: Secondary | ICD-10-CM | POA: Diagnosis not present

## 2020-02-18 HISTORY — PX: BIV ICD GENERATOR CHANGEOUT: EP1194

## 2020-02-18 SURGERY — BIV ICD GENERATOR CHANGEOUT
Anesthesia: LOCAL

## 2020-02-18 MED ORDER — SODIUM CHLORIDE 0.9% FLUSH: 3.0000 mL | INTRAVENOUS | Status: DC | PRN

## 2020-02-18 MED ORDER — SODIUM CHLORIDE 0.9% FLUSH: 3.0000 mL | Freq: Two times a day (BID) | INTRAVENOUS | Status: DC

## 2020-02-18 MED ORDER — FENTANYL CITRATE (PF) 100 MCG/2ML IJ SOLN
INTRAMUSCULAR | Status: DC | PRN
  Administered 2020-02-18: 25 ug via INTRAVENOUS

## 2020-02-18 MED ORDER — ONDANSETRON HCL 4 MG/2ML IJ SOLN: 4.0000 mg | Freq: Four times a day (QID) | INTRAMUSCULAR | Status: DC | PRN

## 2020-02-18 MED ORDER — HEPARIN (PORCINE) IN NACL 1000-0.9 UT/500ML-% IV SOLN
INTRAVENOUS | Status: AC
  Filled 2020-02-18: qty 500

## 2020-02-18 MED ORDER — MIDAZOLAM HCL 5 MG/5ML IJ SOLN
INTRAMUSCULAR | Status: DC | PRN
  Administered 2020-02-18 (×3): 2 mg via INTRAVENOUS

## 2020-02-18 MED ORDER — MIDAZOLAM HCL 5 MG/5ML IJ SOLN
INTRAMUSCULAR | Status: AC
  Filled 2020-02-18: qty 5

## 2020-02-18 MED ORDER — SODIUM CHLORIDE 0.9 % IV SOLN: 250.0000 mL | INTRAVENOUS | Status: DC | PRN

## 2020-02-18 MED ORDER — SODIUM CHLORIDE 0.9 % IV SOLN
80.0000 mg | INTRAVENOUS | Status: AC
  Administered 2020-02-18: 80 mg

## 2020-02-18 MED ORDER — SODIUM CHLORIDE 0.9 % IV SOLN: INTRAVENOUS | Status: DC

## 2020-02-18 MED ORDER — ACETAMINOPHEN 325 MG PO TABS: 325.0000 mg | ORAL_TABLET | ORAL | Status: DC | PRN

## 2020-02-18 MED ORDER — SODIUM CHLORIDE 0.9 % IV SOLN
INTRAVENOUS | Status: AC
  Filled 2020-02-18: qty 2

## 2020-02-18 MED ORDER — CEFAZOLIN SODIUM-DEXTROSE 2-4 GM/100ML-% IV SOLN
INTRAVENOUS | Status: AC
  Filled 2020-02-18: qty 100

## 2020-02-18 MED ORDER — LIDOCAINE HCL (PF) 1 % IJ SOLN
INTRAMUSCULAR | Status: DC | PRN
  Administered 2020-02-18: 50 mL

## 2020-02-18 MED ORDER — FENTANYL CITRATE (PF) 100 MCG/2ML IJ SOLN
INTRAMUSCULAR | Status: AC
  Filled 2020-02-18: qty 2

## 2020-02-18 MED ORDER — CHLORHEXIDINE GLUCONATE 4 % EX LIQD
4.0000 "application " | Freq: Once | CUTANEOUS | Status: DC
  Filled 2020-02-18: qty 60

## 2020-02-18 MED ORDER — CEFAZOLIN SODIUM-DEXTROSE 2-4 GM/100ML-% IV SOLN
2.0000 g | INTRAVENOUS | Status: AC
  Administered 2020-02-18: 2 g via INTRAVENOUS

## 2020-02-18 MED ORDER — LIDOCAINE HCL 1 % IJ SOLN
INTRAMUSCULAR | Status: AC
  Filled 2020-02-18: qty 60

## 2020-02-18 SURGICAL SUPPLY — 7 items
CABLE SURGICAL S-101-97-12 (CABLE) ×2 IMPLANT
DEVICE DISSECT PLASMABLAD 3.0S (MISCELLANEOUS) ×1 IMPLANT
ICD CLARIA MRI DTMA1D4 (ICD Generator) ×2 IMPLANT
PAD PRO RADIOLUCENT 2001M-C (PAD) ×2 IMPLANT
PLASMABLADE 3.0S (MISCELLANEOUS) ×2
POUCH AIGIS-R ANTIBACT ICD (Mesh General) ×2 IMPLANT
TRAY PACEMAKER INSERTION (PACKS) ×2 IMPLANT

## 2020-02-18 NOTE — Progress Notes (Signed)
Discharge instructions discussed and reviewed with pt and spouse via phone both verbalize understanding

## 2020-02-18 NOTE — Interval H&P Note (Signed)
History and Physical Interval Note:  02/18/2020 11:28 AM  Donald Grant  has presented today for surgery, with the diagnosis of ERI.  The various methods of treatment have been discussed with the patient and family. After consideration of risks, benefits and other options for treatment, the patient has consented to  Procedure(s): BIV ICD GENERATOR CHANGEOUT (N/A) as a surgical intervention.  The patient's history has been reviewed, patient examined, no change in status, stable for surgery.  I have reviewed the patient's chart and labs.  Questions were answered to the patient's satisfaction.    ICD Criteria  Current LVEF:45%. Within 12 months prior to implant: No   Heart failure history: Yes, Class II  Cardiomyopathy history: Yes, Non-Ischemic Cardiomyopathy.  Atrial Fibrillation/Atrial Flutter: Yes, Persistent (> 7 Days).  Ventricular tachycardia history: No.  Cardiac arrest history: No.  History of syndromes with risk of sudden death: No.  Previous ICD: Yes, Reason for ICD:  Primary prevention.  Current ICD indication: Primary  PPM indication: Yes. Pacing type: Ventricular. Greater than 40% RV pacing requirement anticipated. Indication: Sick Sinus Syndrome  (CRT)sinus bradycardia and marked second degree AV block  Class I or II Bradycardia indication present: Yes   sinus bradycardia and marked second degree AV block  Beta Blocker therapy for 3 or more months: Yes, prescribed.   Ace Inhibitor/ARB therapy for 3 or more months: Yes, prescribed.   he patient's chart has been reviewed and they meet criteria for BiV ICD generator change.  I have had a thorough discussion with the patient reviewing options.  The patient has had opportunities to ask questions and have them answered. The patient and I have decided together through the [facility] Heart Care Share Decision Support Tool to proceed with BiV ICD generator change at this time.  Risks, benefits, alternatives to BiV ICD generator  change were discussed in detail with the patient today. The patient understands that the risks include but are not limited to bleeding, infection, pneumothorax, perforation, tamponade, vascular damage, renal failure, MI, stroke, death, inappropriate shocks, and lead dislodgement and wishes to proceed.     [name]

## 2020-02-18 NOTE — Discharge Instructions (Signed)
Post procedure care instructions Keep incision clean and dry, no showers until cleared to at your wound check visit. No driving for 2 days.  You can remove outer dressing tomorrow. Leave steri-strips (little pieces of tape) on until seen in the office for wound check appointment. Call the office [phone]) for redness, drainage, swelling, or fever.   Resume pradaxa on Monday 02/23/20

## 2020-02-19 MED FILL — Lidocaine HCl Local Inj 1%: INTRAMUSCULAR | Qty: 60 | Status: AC

## 2020-02-20 ENCOUNTER — Encounter (HOSPITAL_COMMUNITY): Payer: Self-pay | Admitting: Internal Medicine

## 2020-02-24 ENCOUNTER — Other Ambulatory Visit (HOSPITAL_COMMUNITY): Payer: Self-pay | Admitting: *Deleted

## 2020-03-01 ENCOUNTER — Encounter: Payer: 59 | Admitting: Internal Medicine

## 2020-03-01 ENCOUNTER — Other Ambulatory Visit: Payer: Self-pay | Admitting: Internal Medicine

## 2020-03-01 DIAGNOSIS — I482 Chronic atrial fibrillation, unspecified: Secondary | ICD-10-CM

## 2020-03-02 ENCOUNTER — Other Ambulatory Visit: Payer: Self-pay

## 2020-03-02 ENCOUNTER — Ambulatory Visit (INDEPENDENT_AMBULATORY_CARE_PROVIDER_SITE_OTHER): Payer: 59 | Admitting: Emergency Medicine

## 2020-03-02 DIAGNOSIS — Z95810 Presence of automatic (implantable) cardiac defibrillator: Secondary | ICD-10-CM | POA: Diagnosis not present

## 2020-03-02 DIAGNOSIS — I429 Cardiomyopathy, unspecified: Secondary | ICD-10-CM | POA: Diagnosis not present

## 2020-03-02 LAB — CUP PACEART INCLINIC DEVICE CHECK
Battery Remaining Longevity: 82 mo
Battery Voltage: 3.06 V
Brady Statistic AP VP Percent: 99.45 %
Brady Statistic AP VS Percent: 0.38 %
Brady Statistic AS VP Percent: 0.17 %
Brady Statistic AS VS Percent: 0 %
Brady Statistic RA Percent Paced: 99.7 %
Brady Statistic RV Percent Paced: 98.76 %
Date Time Interrogation Session: 20211207121344
HighPow Impedance: 55 Ohm
Implantable Lead Implant Date: 20160628
Implantable Lead Implant Date: 20160628
Implantable Lead Implant Date: 20160628
Implantable Lead Location: 753858
Implantable Lead Location: 753859
Implantable Lead Location: 753860
Implantable Lead Model: 3830
Implantable Lead Model: 5076
Implantable Pulse Generator Implant Date: 20211124
Lead Channel Impedance Value: 247 Ohm
Lead Channel Impedance Value: 304 Ohm
Lead Channel Impedance Value: 399 Ohm
Lead Channel Impedance Value: 418 Ohm
Lead Channel Impedance Value: 456 Ohm
Lead Channel Impedance Value: 475 Ohm
Lead Channel Pacing Threshold Amplitude: 0.75 V
Lead Channel Pacing Threshold Amplitude: 0.875 V
Lead Channel Pacing Threshold Pulse Width: 0.4 ms
Lead Channel Pacing Threshold Pulse Width: 0.8 ms
Lead Channel Sensing Intrinsic Amplitude: 2 mV
Lead Channel Sensing Intrinsic Amplitude: 4.5 mV
Lead Channel Setting Pacing Amplitude: 0.5 V
Lead Channel Setting Pacing Amplitude: 2 V
Lead Channel Setting Pacing Amplitude: 2.5 V
Lead Channel Setting Pacing Pulse Width: 0.03 ms
Lead Channel Setting Pacing Pulse Width: 0.8 ms
Lead Channel Setting Sensing Sensitivity: 0.3 mV

## 2020-03-02 NOTE — Progress Notes (Signed)
CRT-D device and wound check in office. Steri-strips removed, wound edges approximated , no edema, drainage, or redness at wound site. Patient extremely sensitive to all testing . Known VP and dependent RV and LV sensing not done. Thresholds and sensing consistent with previous device measurements. Lead impedance trends stable over time. No mode switch episodes recorded. No ventricular arrhythmia episodes recorded. Patient LV (HIS)paced pacing 98% of the time. RV programmed sub threshold. Device programmed with appropriate safety margins. Audible/vibratory alerts demonstrated for patient. No changes made this session. Estimated longevity 6 years , 10 months.  Patient enrolled in remote follow up and next remote 05/21/19. Follow-up with Dr Johney Frame 05/25/19.

## 2020-03-22 ENCOUNTER — Ambulatory Visit (HOSPITAL_COMMUNITY)
Admission: RE | Admit: 2020-03-22 | Discharge: 2020-03-22 | Disposition: A | Discharge status: Home / Self Care | Payer: 59 | Source: Ambulatory Visit | Attending: Cardiology | Admitting: Cardiology

## 2020-03-22 ENCOUNTER — Other Ambulatory Visit: Payer: Self-pay

## 2020-03-22 ENCOUNTER — Encounter (HOSPITAL_COMMUNITY): Payer: Self-pay | Admitting: Cardiology

## 2020-03-22 VITALS — BP 128/90 | HR 70 | Wt 261.2 lb

## 2020-03-22 DIAGNOSIS — I480 Paroxysmal atrial fibrillation: Secondary | ICD-10-CM | POA: Insufficient documentation

## 2020-03-22 DIAGNOSIS — R002 Palpitations: Secondary | ICD-10-CM | POA: Diagnosis not present

## 2020-03-22 DIAGNOSIS — I5022 Chronic systolic (congestive) heart failure: Secondary | ICD-10-CM | POA: Diagnosis not present

## 2020-03-22 DIAGNOSIS — I4892 Unspecified atrial flutter: Secondary | ICD-10-CM | POA: Diagnosis not present

## 2020-03-22 DIAGNOSIS — Z7901 Long term (current) use of anticoagulants: Secondary | ICD-10-CM | POA: Insufficient documentation

## 2020-03-22 DIAGNOSIS — I429 Cardiomyopathy, unspecified: Secondary | ICD-10-CM | POA: Insufficient documentation

## 2020-03-22 DIAGNOSIS — I493 Ventricular premature depolarization: Secondary | ICD-10-CM | POA: Insufficient documentation

## 2020-03-22 DIAGNOSIS — Z8241 Family history of sudden cardiac death: Secondary | ICD-10-CM | POA: Insufficient documentation

## 2020-03-22 DIAGNOSIS — F1010 Alcohol abuse, uncomplicated: Secondary | ICD-10-CM | POA: Diagnosis not present

## 2020-03-22 DIAGNOSIS — Z8616 Personal history of COVID-19: Secondary | ICD-10-CM | POA: Diagnosis not present

## 2020-03-22 DIAGNOSIS — I471 Supraventricular tachycardia: Secondary | ICD-10-CM | POA: Diagnosis not present

## 2020-03-22 DIAGNOSIS — E785 Hyperlipidemia, unspecified: Secondary | ICD-10-CM | POA: Diagnosis not present

## 2020-03-22 DIAGNOSIS — I4819 Other persistent atrial fibrillation: Secondary | ICD-10-CM

## 2020-03-22 DIAGNOSIS — Z79899 Other long term (current) drug therapy: Secondary | ICD-10-CM | POA: Diagnosis not present

## 2020-03-22 DIAGNOSIS — I10 Essential (primary) hypertension: Secondary | ICD-10-CM | POA: Diagnosis not present

## 2020-03-22 DIAGNOSIS — Z95810 Presence of automatic (implantable) cardiac defibrillator: Secondary | ICD-10-CM | POA: Insufficient documentation

## 2020-03-22 HISTORY — DX: Heart failure, unspecified: I50.9

## 2020-03-22 LAB — BASIC METABOLIC PANEL
Anion gap: 10 (ref 5–15)
BUN: 8 mg/dL (ref 6–20)
CO2: 23 mmol/L (ref 22–32)
Calcium: 9 mg/dL (ref 8.9–10.3)
Chloride: 104 mmol/L (ref 98–111)
Creatinine, Ser: 0.58 mg/dL — ABNORMAL LOW (ref 0.61–1.24)
GFR, Estimated: >60 mL/min (ref >60)
Glucose, Bld: 108 mg/dL — ABNORMAL HIGH (ref 70–99)
Potassium: 3.8 mmol/L (ref 3.5–5.1)
Sodium: 137 mmol/L (ref 135–145)

## 2020-03-22 LAB — LIPID PANEL
Cholesterol: 116 mg/dL (ref 0–200)
HDL: 42 mg/dL (ref >40)
LDL Cholesterol: 48 mg/dL (ref 0–99)
Total CHOL/HDL Ratio: 2.8 RATIO
Triglycerides: 130 mg/dL (ref <150)
VLDL: 26 mg/dL (ref 0–40)

## 2020-03-22 LAB — MAGNESIUM: Magnesium: 2.1 mg/dL (ref 1.7–2.4)

## 2020-03-22 MED ORDER — SPIRONOLACTONE 25 MG PO TABS: 25.0000 mg | ORAL_TABLET | Freq: Every day | ORAL | 3 refills | Status: DC

## 2020-03-22 NOTE — Progress Notes (Signed)
Patient ID: Donald Grant, male   DOB: Dec 08, 1975, 44 y.o.   MRN: [id number] PCP: Dr. Duaine Dredge HF Cardiology: Dr. Shirlee Latch  44 y.o. with history of cardiomyopathy, heavy ETOH use, paroxysmal atrial fibrillation and flutter with bradycardia, and frequent PVCs.  Patient has a long EP history.  He has paroxysmal atrial fibrillation with slow ventricular response.  He had atrial flutter ablation in 2012 and also has atrial tachycardia with focus near the AV node that has not been ablated.  He had another ablation of atypical atrial flutter in 4/17.  He had a holter in 5/15 showing a 3.8 second pause, predominant atrial fibrillation, and 39% PVCs (some of this was likely atrial fibrillation with aberrancy).  He had runs of NSVT.  He is now on dofetilide. He had a Medtronic CRT-D device placed with his bundle lead.  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  Cardiac MRI in 8/15 did not show evidence for ARVC.    He was referred to CHF clinic initially for management of his cardiomyopathy.  Echo in 7/15 suggested EF 30% but difficult to quantify due to PVCs.  Cardiac MRI in 8/15 showed EF 45% with normal RV and a nonspecific RV insertion site delayed enhancement pattern.  He stopped ETOH completely.  Echo in 2/18 showed EF 50-55%.  Echo in 5/20 showed EF 45-50%, normal RV.   He returns for followup of CHF. Doing well in general, no complaints.  Rare palpitations.  No significant exertional dyspnea.  Plays with his band 5-6 nights/week.  Drinking fairly heavily, at least a 6 pack a day. Weight is down a few lbs compared to last appointment.  He is in NSR.  Recent device generator change.   ECG: A-V dual pacing with QTc 475 msec  Labs (5/15): K 3.9, creatinine 0.7 Labs (8/15): creatinine 0.9 Labs (9/15): K 4.3, creatinine 0.83, pBNP 700 Labs (8/17): K 4.2, creatinine 0.71, HCT 42.2 Labs (11/17): hgb 15.4 Labs (12/19): K 4.1, creatinine 0.73 Labs (3/20):  LDL 30 Labs (11/21): K 4.4, creatinine 0.87, hgb 14.3  PMH: 1. History of  heavy ETOH use.  2. Atrial fibrillation: Paroxysmal.  CHADSVASC = 2, he is on Pradaxa.   3. HTN 4. Atrial flutter: s/p ablation 11/12.  He had ablation of atypical atrial flutter again in 4/17.  5. Atrial tachycardia: Mapped to AV node, not ablated.  6. Ventricular arrhythmias: NSVT, also very frequent PVCs. Some of beats that seem to be PVCs are likely aberrantly conducted atrial fibrillation.  Holter (5/15) with 3.8 second pause, atrial fibrillation predominantly, 39% PVCs (some of this likely is atrial fibrillation with aberrancy), runs of WCT (suspected NSVT).  7. Cardiomyopathy: Echo (7/15) with EF 30%, mild diffuse hypokinesis, mild LV dilation.  Cardiac MRI (8/15) with moderate LV dilation, EF 45%, diffuse hypokinesis, normal RV with no evidence for ARVC, delayed enhancement at the RV insertion sites which is a nonspecific pattern.  Lexiscan Cardiolite (5/15) with mild scar in the inferior and inferoseptal walls, no ischemia, not gated due to PVCs.  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   - Medtronic CRT-D with His bundle lead.  - Echo (5/20): EF 45-50%, normal RV. 8. Gout 9. 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  No tobacco.  Occasional marijuana.  Never used cocaine.  Prior heavy ETOH but has quit.   FH: Mother with SCD in her 14s  and dilated cardiomyopathy.  Maternal grandmother with SCD in her 26s, great-aunt with SCD, sister with atrial fibrillation and cardiomyopathy.    ROS: All systems reviewed and negative except as per HPI.   Current Outpatient Medications  Medication Sig Dispense Refill  . acyclovir (ZOVIRAX) 400 MG tablet Take 400 mg by mouth 2 (two) times daily as needed (outbreak).     [name] allopurinol (ZYLOPRIM) 300 MG tablet Take 300 mg by mouth daily.     [name] ALPRAZolam (XANAX) 0.5 MG tablet Take 0.25 tablets by mouth daily as  needed for anxiety.   1  . B Complex-C (B-COMPLEX WITH VITAMIN C) tablet Take 1 tablet by mouth at bedtime.    . calcium carbonate (TUMS - DOSED IN MG ELEMENTAL CALCIUM) 500 MG chewable tablet Chew 2-3 tablets by mouth daily as needed for indigestion or heartburn.    . carvedilol (COREG) 6.25 MG tablet TAKE 1 TABLET BY MOUTH TWICE A DAY 180 tablet 3  . Cholecalciferol (VITAMIN D) 2000 units CAPS Take 4,000 Units by mouth in the morning and at bedtime.     . dabigatran (PRADAXA) 150 MG CAPS capsule Take 1 capsule (150 mg total) by mouth 2 (two) times daily. 180 capsule 1  . dofetilide (TIKOSYN) 500 MCG capsule TAKE 1 CAPSULE BY MOUTH TWICE A DAY 180 capsule 3  . fish oil-omega-3 fatty acids 1000 MG capsule Take 2,000 mg by mouth 2 (two) times daily.    . magnesium oxide (MAG-OX) 400 (241.3 Mg) MG tablet Take 400 mg by mouth daily.   11  . Multiple Vitamin (MULITIVITAMIN WITH MINERALS) TABS Take 1 tablet by mouth every evening.    . Psyllium (METAMUCIL PO) Take 8.4 g by mouth 3 (three) times a week. Use 2 teaspoons into water once daily    . rosuvastatin (CRESTOR) 40 MG tablet Take 40 mg by mouth every evening.    . sacubitril-valsartan (ENTRESTO) 49-51 MG Take 1 tablet by mouth 2 (two) times daily. 180 tablet 2  . spironolactone (ALDACTONE) 25 MG tablet Take 1 tablet (25 mg total) by mouth daily. 30 tablet 3   No current facility-administered medications for this encounter.    BP 128/90   Pulse 70   Wt 118.5 kg (261 lb 3.2 oz)   SpO2 97%   BMI 32.65 kg/m  General: NAD Neck: No JVD, no thyromegaly or thyroid nodule.  Lungs: Clear to auscultation bilaterally with normal respiratory effort. CV: Nondisplaced PMI.  Heart regular S1/S2, no S3/S4, no murmur.  No peripheral edema.  No carotid bruit.  Normal pedal pulses.  Abdomen: Soft, nontender, no hepatosplenomegaly, no distention.  Skin: Intact without lesions or rashes.  Neurologic: Alert and oriented x 3.  Psych: Normal  affect. Extremities: No clubbing or cyanosis.  HEENT: Normal.   Assessment/Plan:  1. Cardiomyopathy:  Most recent echo in 5/20 showed EF 45-50%.  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  Of note, heavy ETOH may contribute to cardiomyopathy.  NYHA class I currrently, not volume overloaded.  - We talked again today about backing off on ETOH intake. - Continue Entresto and Coreg at current doses. - Increase spironolactone to 25 mg daily with BMET today and in 10 days.  - I will obtain  repeat echo, if EF remains low, will continue titrate up Coreg and Entresto and would also consider addition of [place].   2. Atrial fibrillation: Paroxysmal atrial fibrillation with bradycardic ventricular response, now with His bundle pacing.  He is in NSR today.   - He is on Pradaxa with no bleeding sequelae. CBC stable recently.  - Continue dofetilide, paced QTc 475 msec today. BMET/Mg today.  - Needs to cut back on ETOH as heavy ETOH will increase risk of recurrent atrial fibrillation.  3. Atrial flutter: s/p ablation most recently in 4/17.   4. Atrial tachycardia: Prior history, mapped to area near AV node.  5. 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   7. Hyperlipidemia: Check lipids today.   Followup in 2 months with echo  Marca Ancona 03/22/2020

## 2020-03-22 NOTE — Patient Instructions (Addendum)
INCREASE Spironolactone 25mg  (1 tablet) Daily  Labs done today, your results will be available in MyChart, we will contact you for abnormal readings.  Your physician recommends that you schedule repeat labs in 10 days  Your physician recommends that you schedule a follow-up appointment in: 2 months with an echocardiogram  Your physician has requested that you have an echocardiogram. Echocardiography is a painless test that uses sound waves to create images of your heart. It provides your doctor with information about the size and shape of your heart and how well your hearts chambers and valves are working. This procedure takes approximately one hour. There are no restrictions for this procedure.  If you have any questions or concerns before your next appointment please send a message through [place] or call our office at [phone].    TO LEAVE A MESSAGE FOR THE NURSE SELECT OPTION 2, PLEASE LEAVE A MESSAGE INCLUDING:  YOUR NAME  DATE OF BIRTH  CALL BACK NUMBER  REASON FOR CALL**this is important as we prioritize the call backs  YOU WILL RECEIVE A CALL BACK THE SAME DAY AS LONG AS YOU CALL BEFORE 4:00 PM

## 2020-03-24 ENCOUNTER — Other Ambulatory Visit: Payer: Self-pay | Admitting: Internal Medicine

## 2020-04-01 ENCOUNTER — Other Ambulatory Visit: Payer: Self-pay

## 2020-04-01 ENCOUNTER — Ambulatory Visit (HOSPITAL_COMMUNITY)
Admission: RE | Admit: 2020-04-01 | Discharge: 2020-04-01 | Disposition: A | Discharge status: Home / Self Care | Payer: 59 | Source: Ambulatory Visit | Attending: Cardiology | Admitting: Cardiology

## 2020-04-01 DIAGNOSIS — I4819 Other persistent atrial fibrillation: Secondary | ICD-10-CM | POA: Diagnosis not present

## 2020-04-01 LAB — BASIC METABOLIC PANEL
Anion gap: 9 (ref 5–15)
BUN: 13 mg/dL (ref 6–20)
CO2: 27 mmol/L (ref 22–32)
Calcium: 9.4 mg/dL (ref 8.9–10.3)
Chloride: 102 mmol/L (ref 98–111)
Creatinine, Ser: 0.74 mg/dL (ref 0.61–1.24)
GFR, Estimated: >60 mL/min (ref >60)
Glucose, Bld: 116 mg/dL — ABNORMAL HIGH (ref 70–99)
Potassium: 4.4 mmol/L (ref 3.5–5.1)
Sodium: 138 mmol/L (ref 135–145)

## 2020-04-06 ENCOUNTER — Telehealth: Payer: Self-pay

## 2020-04-06 NOTE — Telephone Encounter (Signed)
**Note De-Identified Keiandra Sullenger Obfuscation** Pradaxa PA request received from [employer]. I have attempted this PA through covermymeds multiple times using the Key: [id number] but the address nor the ID match.  There was a new ins card that was scanned into his chart on 1/6/ 2022 [facility] but the info on that card does not work either.  I called the pt but got no answer so I left a message on the his VM asking him to call Larita Fife at Dr Amedeo Plenty office at [phone] concerning his Pradaxa.

## 2020-04-28 ENCOUNTER — Other Ambulatory Visit: Payer: Self-pay | Admitting: Internal Medicine

## 2020-04-28 ENCOUNTER — Telehealth: Payer: Self-pay | Admitting: Pharmacist

## 2020-04-28 NOTE — Telephone Encounter (Signed)
Insurance does not cover Pradaxa, they want him to change to Eliquis. Spoke with pt who is ok with this change as long as Dr Johney Frame feels it's a comparable substitute. Discussed safety and efficacy data with pt including lower bleed risk with Eliquis over Pradaxa. Also activated copay card for pt and sent info to his pharmacy. Pt was appreciative for assistance. Will forward to Dr Johney Frame as an Lorain Childes.

## 2020-04-28 NOTE — Telephone Encounter (Signed)
Pradaxa 150mg  refill request received. Pt is 45 years old, weight-118.5kg, Crea-0.74 on 04/01/20, last seen by Dr. 05/30/20 on 03/22/2020 and has an appt with Dr. 03/24/2020 on 05/24/20, Diagnosis-Afib, CrCl-211.82ml/min; Dose is appropriate based on dosing criteria. Will send in refill to requested pharmacy.

## 2020-04-28 NOTE — Telephone Encounter (Signed)
Insurance does not cover Pradaxa, they want him to change to Eliquis. Spoke with pt who is ok with this change as long as Dr Johney Frame feels it's a comparable substitute. Discussed safety and efficacy data with pt including lower bleed risk with Eliquis over Pradaxa. 90 day supply is $30 without needing copay card (did activate one just in case, ID is [id number] if needed in the future). Pt was appreciative for assistance.

## 2020-05-03 ENCOUNTER — Other Ambulatory Visit: Payer: Self-pay | Admitting: Internal Medicine

## 2020-05-05 NOTE — Telephone Encounter (Signed)
Pt's pharmacy is requesting a refill on magnesium oxide. Would Dr. Allred like to refill this medication? Please address °

## 2020-05-09 NOTE — Telephone Encounter (Signed)
I am ok with switching to eliquis 5mg  BID.

## 2020-05-10 NOTE — Telephone Encounter (Signed)
[name], MD 19 hours ago (11:50 AM)     I am ok with switching to eliquis 5mg  BID.

## 2020-05-20 ENCOUNTER — Ambulatory Visit (INDEPENDENT_AMBULATORY_CARE_PROVIDER_SITE_OTHER): Payer: 59

## 2020-05-20 DIAGNOSIS — I440 Atrioventricular block, first degree: Secondary | ICD-10-CM | POA: Diagnosis not present

## 2020-05-20 DIAGNOSIS — Z95810 Presence of automatic (implantable) cardiac defibrillator: Secondary | ICD-10-CM

## 2020-05-20 DIAGNOSIS — I4819 Other persistent atrial fibrillation: Secondary | ICD-10-CM | POA: Diagnosis not present

## 2020-05-20 LAB — CUP PACEART REMOTE DEVICE CHECK
Battery Remaining Longevity: 78 mo
Battery Voltage: 3.02 V
Brady Statistic AP VP Percent: 99.59 %
Brady Statistic AP VS Percent: 0.3 %
Brady Statistic AS VP Percent: 0.11 %
Brady Statistic AS VS Percent: 0 %
Brady Statistic RA Percent Paced: 99.82 %
Brady Statistic RV Percent Paced: 99.26 %
Date Time Interrogation Session: 20220224043723
HighPow Impedance: 67 Ohm
Implantable Lead Implant Date: 20160628
Implantable Lead Implant Date: 20160628
Implantable Lead Implant Date: 20160628
Implantable Lead Location: 753858
Implantable Lead Location: 753859
Implantable Lead Location: 753860
Implantable Lead Model: 3830
Implantable Lead Model: 5076
Implantable Pulse Generator Implant Date: 20211124
Lead Channel Impedance Value: 228 Ohm
Lead Channel Impedance Value: 285 Ohm
Lead Channel Impedance Value: 361 Ohm
Lead Channel Impedance Value: 418 Ohm
Lead Channel Impedance Value: 456 Ohm
Lead Channel Impedance Value: 456 Ohm
Lead Channel Pacing Threshold Amplitude: 1.125 V
Lead Channel Pacing Threshold Pulse Width: 0.8 ms
Lead Channel Sensing Intrinsic Amplitude: 2 mV
Lead Channel Sensing Intrinsic Amplitude: 2 mV
Lead Channel Sensing Intrinsic Amplitude: 5.125 mV
Lead Channel Sensing Intrinsic Amplitude: 5.125 mV
Lead Channel Setting Pacing Amplitude: 0.5 V
Lead Channel Setting Pacing Amplitude: 2 V
Lead Channel Setting Pacing Amplitude: 2.5 V
Lead Channel Setting Pacing Pulse Width: 0.03 ms
Lead Channel Setting Pacing Pulse Width: 0.8 ms
Lead Channel Setting Sensing Sensitivity: 0.3 mV

## 2020-05-24 ENCOUNTER — Encounter: Payer: Self-pay | Admitting: Internal Medicine

## 2020-05-24 ENCOUNTER — Other Ambulatory Visit: Payer: Self-pay

## 2020-05-24 ENCOUNTER — Ambulatory Visit: Payer: 59 | Admitting: Internal Medicine

## 2020-05-24 VITALS — BP 140/82 | HR 79 | Ht 75.0 in | Wt 264.2 lb

## 2020-05-24 DIAGNOSIS — I440 Atrioventricular block, first degree: Secondary | ICD-10-CM | POA: Diagnosis not present

## 2020-05-24 DIAGNOSIS — I4819 Other persistent atrial fibrillation: Secondary | ICD-10-CM

## 2020-05-24 DIAGNOSIS — I429 Cardiomyopathy, unspecified: Secondary | ICD-10-CM

## 2020-05-24 DIAGNOSIS — Z95810 Presence of automatic (implantable) cardiac defibrillator: Secondary | ICD-10-CM

## 2020-05-24 DIAGNOSIS — I5022 Chronic systolic (congestive) heart failure: Secondary | ICD-10-CM

## 2020-05-24 NOTE — Progress Notes (Signed)
PCP: Mosetta Putt, MD Primary Cardiologist: Dr Shirlee Latch Primary EP: Dr Johney Frame  Donald Grant is a 45 y.o. male who presents today for routine electrophysiology followup.  Since last being seen in our clinic, the patient reports doing very well.  Today, he denies symptoms of palpitations, chest pain, shortness of breath,  lower extremity edema, dizziness, presyncope, syncope, or ICD shocks.  The patient is otherwise without complaint today.   Past Medical History:  Diagnosis Date  . Anxiety   . Atrial fibrillation (HCC)    PVI ablatioin at [facility] 04/21/14 and May 2016 Dr. [name]  . Atrial flutter [facility])    CTI ablation by Dr [name] 02/24/11  . Atrial tachycardia (HCC)    mapped to AV node by Dr [name], ablation not performed  . Cardiomyopathy, nonischemic (HCC)    Medtronic BIVE ICD   . CHF (congestive heart failure) (HCC)   . Dyslipidemia   . First degree AV block   . GERD (gastroesophageal reflux disease)   . Gout    bilateral feet  . Hx of cardiovascular stress test    Lexiscan Myoview (07/2013):  Inf and inferolateral scar, no ischemia, no gated  . Lymphadenopathy of right cervical region ?Mono 04/16/2013  . Premature ventricular contraction    Past Surgical History:  Procedure Laterality Date  . ABLATION OF DYSRHYTHMIC FOCUS  07/13/15   Atypical atrial flutter ablation at [facility] by Dr [name]  . ATRIAL ABLATION SURGERY  02/24/11   CTI ablation by Dr [name]  . ATRIAL FIBRILLATION ABLATION  04/24/14, 08/13/14   PVI at [facility] by Dr [name], PVI with posterior wall Box and FIRM ablation performed  . ATRIAL FLUTTER ABLATION N/A 02/24/2011   Procedure: ATRIAL FLUTTER ABLATION;  Surgeon: [name], MD;  Location: [facility] CATH LAB;  Service: Cardiovascular;  Laterality: N/A;  . BIV ICD GENERATOR CHANGEOUT N/A 02/18/2020   Procedure: BIV ICD GENERATOR CHANGEOUT;  Surgeon: [name], MD;  Location: [facility] INVASIVE CV LAB;  Service: Cardiovascular;  Laterality: N/A;  . CARDIAC  DEFIBRILLATOR PLACEMENT    . CARDIOVERSION  03/29/2011   Procedure: CARDIOVERSION;  Surgeon: [name], MD;  Location: [facility] OR;  Service: Cardiovascular;  Laterality: N/A;  . CARDIOVERSION N/A 02/25/2015   Procedure: CARDIOVERSION;  Surgeon: [name], MD;  Location: [facility] ENDOSCOPY;  Service: Cardiovascular;  Laterality: N/A;  . CARDIOVERSION (BEDSIDE)  03/29/2011      . ELECTROPHYSIOLOGIC STUDY N/A 06/21/2015   Procedure: Cardioversion;  Surgeon: [name], MD;  Location: [facility] INVASIVE CV LAB;  Service: Cardiovascular;  Laterality: N/A;  . EVALUATION UNDER ANESTHESIA WITH HEMORRHOIDECTOMY N/A 02/05/2017   Procedure: EXAM UNDER ANESTHESIA;  Surgeon: Karie Soda, MD;  Location: [facility] ORS;  Service: General;  Laterality: N/A;  . EXAMINATION UNDER ANESTHESIA  12/19/12   Anal Fistula  . FISTULOTOMY N/A 02/05/2017   Procedure: REPAIR PERIRECTAL FISTULA ERAS PATHWAY;  Surgeon: Karie Soda, MD;  Location: [facility] ORS;  Service: General;  Laterality: N/A;  . MOUTH SURGERY    . PACEMAKER IMPLANT    . ROOT CANAL      ROS- all systems are reviewed and negative except as per HPI above  Current Outpatient Medications  Medication Sig Dispense Refill  . acyclovir (ZOVIRAX) 400 MG tablet Take 400 mg by mouth 2 (two) times daily as needed (outbreak).     [name] allopurinol (ZYLOPRIM) 300 MG tablet Take 300 mg by mouth daily.     [name] ALPRAZolam (XANAX) 0.5 MG tablet Take  0.25 tablets by mouth daily as needed for anxiety.   1  . apixaban (ELIQUIS) 5 MG TABS tablet Take 1 tablet (5 mg total) by mouth 2 (two) times daily. 180 tablet 1  . B Complex-C (B-COMPLEX WITH VITAMIN C) tablet Take 1 tablet by mouth at bedtime.    . calcium carbonate (TUMS - DOSED IN MG ELEMENTAL CALCIUM) 500 MG chewable tablet Chew 2-3 tablets by mouth daily as needed for indigestion or heartburn.    . carvedilol (COREG) 6.25 MG tablet TAKE 1 TABLET BY MOUTH TWICE A DAY 180 tablet 3  . Cholecalciferol (VITAMIN D) 2000 units CAPS Take  4,000 Units by mouth in the morning and at bedtime.     . dofetilide (TIKOSYN) 500 MCG capsule TAKE 1 CAPSULE BY MOUTH TWICE A DAY 180 capsule 3  . fish oil-omega-3 fatty acids 1000 MG capsule Take 2,000 mg by mouth 2 (two) times daily.    . magnesium oxide (MAG-OX) 400 MG tablet TAKE 1 TABLET BY MOUTH EVERY DAY 90 tablet 3  . Multiple Vitamin (MULITIVITAMIN WITH MINERALS) TABS Take 1 tablet by mouth every evening.    . Psyllium (METAMUCIL PO) Take 8.4 g by mouth 3 (three) times a week. Use 2 teaspoons into water once daily    . rosuvastatin (CRESTOR) 40 MG tablet Take 40 mg by mouth every evening.    . sacubitril-valsartan (ENTRESTO) 49-51 MG Take 1 tablet by mouth 2 (two) times daily. 180 tablet 2  . spironolactone (ALDACTONE) 25 MG tablet Take 1 tablet (25 mg total) by mouth daily. 30 tablet 3   No current facility-administered medications for this visit.    Physical Exam: Vitals:   05/24/20 1041  BP: 140/82  Pulse: 79  SpO2: 97%  Weight: 264 lb 3.2 oz (119.8 kg)  Height: 6\' 3"  (1.905 m)    GEN- The patient is well appearing, alert and oriented x 3 today.   Head- normocephalic, atraumatic Eyes-  Sclera clear, conjunctiva pink Ears- hearing intact Oropharynx- clear Lungs- Clear to ausculation bilaterally, normal work of breathing Chest- ICD pocket is well healed Heart- Regular rate and rhythm, no murmurs, rubs or gallops, PMI not laterally displaced GI- soft, NT, ND, + BS Extremities- no clubbing, cyanosis, or edema  ICD interrogation- reviewed in detail today,  See PACEART report  ekg tracing ordered today is personally reviewed and shows sinus with conduction system pacing  Wt Readings from Last 3 Encounters:  05/24/20 264 lb 3.2 oz (119.8 kg)  03/22/20 261 lb 3.2 oz (118.5 kg)  02/18/20 250 lb (113.4 kg)    Assessment and Plan:  1.  Chronic systolic dysfunction/ AV block/ nonischemic CM euvolemic today Stable on an appropriate medical regimen Normal ICD  function He has conduction system pacing using his LV lead. (implanted at [facility]).  Could probably turn LV lead threshold down to 2.5V@0 .5 msec on return to promote battery longevity.   His RV lead is programmed sub threshold See Pace Art report No changes today he is device dependant today not followed in ICM device clinic  2. Persistent atrial fibrillation afib burden is <0.1% Doing well with tikosyn qtc 456 msec today Labs 04/01/20 reviewed We will need to follow closely on tikosyn to avoid toxicity chads2vasc score is 1.  He is on pradaxa  Risks, benefits and potential toxicities for medications prescribed and/or refilled reviewed with patient today.   Return to see EP PA every 6 months Follow-up with Dr 05/30/20 as scheduled  Shirlee Latch  MD, [facility] 05/24/2020 11:00 AM

## 2020-05-24 NOTE — Patient Instructions (Addendum)
Medication Instructions:  Your physician recommends that you continue on your current medications as directed. Please refer to the Current Medication list given to you today.  Labwork: None ordered.  Testing/Procedures: None ordered.  Follow-Up: Your physician wants you to follow-up in: 6 months with [name], MD or one of the following Advanced Practice Providers on your designated Care Team:      Casimiro Needle "Mardelle Matte" Lanna Poche, [place]   You will receive a reminder letter in the mail two months in advance. If you don't receive a letter, please call our office to schedule the follow-up appointment.  Remote monitoring is used to monitor your ICD from home. This monitoring reduces the number of office visits required to check your device to one time per year. It allows [place] to keep an eye on the functioning of your device to ensure it is working properly. You are scheduled for a device check from home on 08/20/19. You may send your transmission at any time that day. If you have a wireless device, the transmission will be sent automatically. After your physician reviews your transmission, you will receive a postcard with your next transmission date.  Any Other Special Instructions Will Be Listed Below (If Applicable).  If you need a refill on your cardiac medications before your next appointment, please call your pharmacy.

## 2020-05-25 ENCOUNTER — Encounter (HOSPITAL_COMMUNITY): Payer: Self-pay | Admitting: Cardiology

## 2020-05-25 ENCOUNTER — Ambulatory Visit (HOSPITAL_COMMUNITY)
Admission: RE | Admit: 2020-05-25 | Discharge: 2020-05-25 | Disposition: A | Discharge status: Home / Self Care | Payer: 59 | Source: Ambulatory Visit | Attending: Cardiology | Admitting: Cardiology

## 2020-05-25 ENCOUNTER — Ambulatory Visit (HOSPITAL_BASED_OUTPATIENT_CLINIC_OR_DEPARTMENT_OTHER)
Admission: RE | Admit: 2020-05-25 | Discharge: 2020-05-25 | Disposition: A | Discharge status: Home / Self Care | Payer: 59 | Source: Ambulatory Visit | Attending: Cardiology | Admitting: Cardiology

## 2020-05-25 VITALS — BP 130/80 | HR 60 | Wt 262.0 lb

## 2020-05-25 DIAGNOSIS — Z7901 Long term (current) use of anticoagulants: Secondary | ICD-10-CM | POA: Insufficient documentation

## 2020-05-25 DIAGNOSIS — I429 Cardiomyopathy, unspecified: Secondary | ICD-10-CM | POA: Insufficient documentation

## 2020-05-25 DIAGNOSIS — I493 Ventricular premature depolarization: Secondary | ICD-10-CM | POA: Insufficient documentation

## 2020-05-25 DIAGNOSIS — I471 Supraventricular tachycardia: Secondary | ICD-10-CM | POA: Insufficient documentation

## 2020-05-25 DIAGNOSIS — I480 Paroxysmal atrial fibrillation: Secondary | ICD-10-CM

## 2020-05-25 DIAGNOSIS — Z79899 Other long term (current) drug therapy: Secondary | ICD-10-CM | POA: Diagnosis not present

## 2020-05-25 DIAGNOSIS — Z950 Presence of cardiac pacemaker: Secondary | ICD-10-CM | POA: Diagnosis not present

## 2020-05-25 DIAGNOSIS — I428 Other cardiomyopathies: Secondary | ICD-10-CM | POA: Diagnosis not present

## 2020-05-25 DIAGNOSIS — I4819 Other persistent atrial fibrillation: Secondary | ICD-10-CM | POA: Diagnosis not present

## 2020-05-25 DIAGNOSIS — Z8616 Personal history of COVID-19: Secondary | ICD-10-CM | POA: Diagnosis not present

## 2020-05-25 DIAGNOSIS — E785 Hyperlipidemia, unspecified: Secondary | ICD-10-CM | POA: Diagnosis not present

## 2020-05-25 DIAGNOSIS — I110 Hypertensive heart disease with heart failure: Secondary | ICD-10-CM | POA: Insufficient documentation

## 2020-05-25 DIAGNOSIS — I509 Heart failure, unspecified: Secondary | ICD-10-CM | POA: Insufficient documentation

## 2020-05-25 LAB — ECHOCARDIOGRAM COMPLETE
Calc EF: 37.5 %
S' Lateral: 5 cm
Single Plane A2C EF: 39.4 %
Single Plane A4C EF: 35.3 %

## 2020-05-25 MED ORDER — ENTRESTO 97-103 MG PO TABS: 1.0000 | ORAL_TABLET | Freq: Two times a day (BID) | ORAL | 11 refills | Status: DC

## 2020-05-25 NOTE — Patient Instructions (Addendum)
No Labs done today.  INCREASE Entresto to 97-103mg  (1 tablet) by mouth 2 times daily.  No other medication changes were made. Please continue all current medications as prescribed.  Your physician recommends that you schedule a follow-up appointment in: 10 days for a lab only appointment.3 weeks for an appointment with our Clinic Pharmacist, Lauren and in 6-8 weeks for an appointment with Dr. Shirlee Latch.   If you have any questions or concerns before your next appointment please send [place] a message through [place] or call our office at [phone].    TO LEAVE A MESSAGE FOR THE NURSE SELECT OPTION 2, PLEASE LEAVE A MESSAGE INCLUDING: . YOUR NAME . DATE OF BIRTH . CALL BACK NUMBER . REASON FOR CALL**this is important as we prioritize the call backs  YOU WILL RECEIVE A CALL BACK THE SAME DAY AS LONG AS YOU CALL BEFORE 4:00 PM   Do the following things EVERYDAY: 1) Weigh yourself in the morning before breakfast. Write it down and keep it in a log. 2) Take your medicines as prescribed 3) Eat low salt foods--Limit salt (sodium) to 2000 mg per day.  4) Stay as active as you can everyday 5) Limit all fluids for the day to less than 2 liters   At the [facility], you and your health needs are our priority. As part of our continuing mission to provide you with exceptional heart care, we have created designated Provider Care Teams. These Care Teams include your primary Cardiologist (physician) and Advanced Practice Providers (APPs- Physician Assistants and Nurse Practitioners) who all work together to provide you with the care you need, when you need it.   You may see any of the following providers on your designated Care Team at your next follow up: [name] Dr Arvilla Meres . Dr Marca Ancona . Tonye Becket, NP . Robbie Lis, PA . Karle Plumber, PharmD   Please be sure to bring in all your medications bottles to every appointment.

## 2020-05-25 NOTE — Progress Notes (Signed)
Echocardiogram 2D Echocardiogram has been performed.  Donald Grant 05/25/2020, 9:52 AM

## 2020-05-25 NOTE — Progress Notes (Signed)
Patient ID: Donald Grant, male   DOB: 05/14/1975, 45 y.o.   MRN: [id number] PCP: Dr. Duaine Dredge HF Cardiology: Dr. Shirlee Latch  45 y.o. with history of cardiomyopathy, heavy ETOH use, paroxysmal atrial fibrillation and flutter with bradycardia, and frequent PVCs.  Patient has a long EP history.  He has paroxysmal atrial fibrillation with slow ventricular response.  He had atrial flutter ablation in 2012 and also has atrial tachycardia with focus near the AV node that has not been ablated.  He had another ablation of atypical atrial flutter in 4/17.  He had a holter in 5/15 showing a 3.8 second pause, predominant atrial fibrillation, and 39% PVCs (some of this was likely atrial fibrillation with aberrancy).  He had runs of NSVT.  He is now on dofetilide. He had a Medtronic CRT-D device placed with his bundle lead.  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  Cardiac MRI in 8/15 did not show evidence for ARVC.    He was referred to CHF clinic initially for management of his cardiomyopathy.  Echo in 7/15 suggested EF 30% but difficult to quantify due to PVCs.  Cardiac MRI in 8/15 showed EF 45% with normal RV and a nonspecific RV insertion site delayed enhancement pattern.  He stopped ETOH completely.  Echo in 2/18 showed EF 50-55%.  Echo in 5/20 showed EF 45-50%, normal RV.  Echo was done today and reviewed; EF 35-40%, mild LV dilation, mild LVH, normal RV.   He returns for followup of CHF. Feeling good overall.  Walks for 2 miles with no dyspnea on the treadmill.  No chest pain.  Rare fluttering in his chest.  No lightheadedness. He is drinking more heavily, 6+ drinks/day.  Weight is stable.    Medtronic device interrogation: 99% BiV pacing, no AF, no VT  Labs (5/15): K 3.9, creatinine 0.7 Labs (8/15): creatinine 0.9 Labs (9/15): K 4.3, creatinine 0.83, pBNP 700 Labs (8/17): K 4.2, creatinine 0.71, HCT 42.2 Labs (11/17): hgb 15.4 Labs (12/19): K 4.1,  creatinine 0.73 Labs (3/20): LDL 30 Labs (11/21): K 4.4, creatinine 0.87, hgb 14.3 Labs (12/21): LDL 48, HDL 42 Labs (1/22): K 4.4, creatinine 0.74  PMH: 1. History of  heavy ETOH use.  2. Atrial fibrillation: Paroxysmal.  CHADSVASC = 2, he is on Pradaxa.   3. HTN 4. Atrial flutter: s/p ablation 11/12.  He had ablation of atypical atrial flutter again in 4/17.  5. Atrial tachycardia: Mapped to AV node, not ablated.  6. Ventricular arrhythmias: NSVT, also very frequent PVCs. Some of beats that seem to be PVCs are likely aberrantly conducted atrial fibrillation.  Holter (5/15) with 3.8 second pause, atrial fibrillation predominantly, 39% PVCs (some of this likely is atrial fibrillation with aberrancy), runs of WCT (suspected NSVT).  7. Cardiomyopathy: Echo (7/15) with EF 30%, mild diffuse hypokinesis, mild LV dilation.  Cardiac MRI (8/15) with moderate LV dilation, EF 45%, diffuse hypokinesis, normal RV with no evidence for ARVC, delayed enhancement at the RV insertion sites which is a nonspecific pattern.  Lexiscan Cardiolite (5/15) with mild scar in the inferior and inferoseptal walls, no ischemia, not gated due to PVCs.  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   - Medtronic CRT-D with His bundle lead.  - Echo (5/20): EF 45-50%, normal RV. - Echo (3/22): EF 35-40%, mild LV dilation/mild LVH, normal RV.  8. Gout 9. 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   SH: Musician.  Married  with 2 daughters.  No tobacco.  Occasional marijuana.  Never used cocaine.  Heavy ETOH use.   FH: Mother with SCD in her 53s and dilated cardiomyopathy.  Maternal grandmother with SCD in her 55s, great-aunt with SCD, sister with atrial fibrillation and cardiomyopathy.    ROS: All systems reviewed and negative except as per HPI.   Current Outpatient Medications  Medication Sig Dispense Refill  . acyclovir (ZOVIRAX) 400 MG tablet Take 400 mg by mouth 2 (two) times daily as needed (outbreak).      [name] allopurinol (ZYLOPRIM) 300 MG tablet Take 300 mg by mouth daily.     [name] ALPRAZolam (XANAX) 0.5 MG tablet Take 0.25 tablets by mouth daily as needed for anxiety.   1  . apixaban (ELIQUIS) 5 MG TABS tablet Take 1 tablet (5 mg total) by mouth 2 (two) times daily. 180 tablet 1  . B Complex-C (B-COMPLEX WITH VITAMIN C) tablet Take 1 tablet by mouth at bedtime.    . calcium carbonate (TUMS - DOSED IN MG ELEMENTAL CALCIUM) 500 MG chewable tablet Chew 2-3 tablets by mouth daily as needed for indigestion or heartburn.    . carvedilol (COREG) 6.25 MG tablet TAKE 1 TABLET BY MOUTH TWICE A DAY 180 tablet 3  . Cholecalciferol (VITAMIN D) 2000 units CAPS Take 4,000 Units by mouth in the morning and at bedtime.     . dofetilide (TIKOSYN) 500 MCG capsule TAKE 1 CAPSULE BY MOUTH TWICE A DAY 180 capsule 3  . fish oil-omega-3 fatty acids 1000 MG capsule Take 2,000 mg by mouth 2 (two) times daily.    . magnesium oxide (MAG-OX) 400 MG tablet TAKE 1 TABLET BY MOUTH EVERY DAY 90 tablet 3  . Multiple Vitamin (MULITIVITAMIN WITH MINERALS) TABS Take 1 tablet by mouth every evening.    . Psyllium (METAMUCIL PO) Take 8.4 g by mouth 3 (three) times a week. Use 2 teaspoons into water once daily    . rosuvastatin (CRESTOR) 40 MG tablet Take 40 mg by mouth every evening.    . sacubitril-valsartan (ENTRESTO) 97-103 MG Take 1 tablet by mouth 2 (two) times daily. 60 tablet 11  . spironolactone (ALDACTONE) 25 MG tablet Take 1 tablet (25 mg total) by mouth daily. 30 tablet 3   No current facility-administered medications for this encounter.    BP 130/80   Pulse 60   Wt 118.8 kg (262 lb)   SpO2 97%   BMI 32.75 kg/m  General: NAD Neck: No JVD, no thyromegaly or thyroid nodule.  Lungs: Clear to auscultation bilaterally with normal respiratory effort. CV: Nondisplaced PMI.  Heart regular S1/S2, no S3/S4, no murmur.  No peripheral edema.  No carotid bruit.  Normal pedal pulses.  Abdomen: Soft, nontender, no  hepatosplenomegaly, no distention.  Skin: Intact without lesions or rashes.  Neurologic: Alert and oriented x 3.  Psych: Normal affect. Extremities: No clubbing or cyanosis.  HEENT: Normal.   Assessment/Plan:  1. 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  He has a Medtronic ICD with His bundle lead.  Of note, heavy ETOH likely contributes to cardiomyopathy.  Echo today showed EF down to 35-40% in the setting of more heavy ETOH use.  NYHA class I currrently, not volume overloaded.  - We discussed cutting out ETOH, he is motivated  to stop completely. He has LMNA cardiomyopathy, but EF appears to trend down when he is drinking more heavily.  - Continue Coreg 6.25 mg bid.  - Increase Entresto to 97/103 bid.  BMET today and in 10 days.  - Continue spironolactone 25 mg daily.  - Will need to start Farxiga in the future.   2. Atrial fibrillation: Paroxysmal atrial fibrillation with bradycardic ventricular response, now with His bundle pacing.  He is in NSR today.   - He is on Eliquis with no bleeding sequelae. CBC stable recently.  - Continue dofetilide, recent ECG at Dr. Jenel Lucks office was ok.   - Needs to cut back on ETOH as heavy ETOH will increase risk of recurrent atrial fibrillation.  3. Atrial flutter: s/p ablation most recently in 4/17.   4. Atrial tachycardia: Prior history, mapped to area near AV node.  5. 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   7. Hyperlipidemia: Good lipids in 12/21.   See HF pharmacist in 3 wks for med titration. Followup in 6 wks with me.   Marca Ancona 05/25/2020

## 2020-05-28 NOTE — Progress Notes (Signed)
Remote ICD transmission.   

## 2020-06-04 ENCOUNTER — Other Ambulatory Visit: Payer: Self-pay

## 2020-06-04 ENCOUNTER — Ambulatory Visit (HOSPITAL_COMMUNITY)
Admission: RE | Admit: 2020-06-04 | Discharge: 2020-06-04 | Disposition: A | Discharge status: Home / Self Care | Payer: 59 | Source: Ambulatory Visit | Attending: Cardiology | Admitting: Cardiology

## 2020-06-04 DIAGNOSIS — I428 Other cardiomyopathies: Secondary | ICD-10-CM | POA: Diagnosis not present

## 2020-06-04 LAB — BASIC METABOLIC PANEL
Anion gap: 8 (ref 5–15)
BUN: 13 mg/dL (ref 6–20)
CO2: 24 mmol/L (ref 22–32)
Calcium: 9.2 mg/dL (ref 8.9–10.3)
Chloride: 106 mmol/L (ref 98–111)
Creatinine, Ser: 0.6 mg/dL — ABNORMAL LOW (ref 0.61–1.24)
GFR, Estimated: >60 mL/min (ref >60)
Glucose, Bld: 110 mg/dL — ABNORMAL HIGH (ref 70–99)
Potassium: 4 mmol/L (ref 3.5–5.1)
Sodium: 138 mmol/L (ref 135–145)

## 2020-06-04 LAB — MAGNESIUM: Magnesium: 2.2 mg/dL (ref 1.7–2.4)

## 2020-06-09 NOTE — Progress Notes (Signed)
PCP: Dr. Duaine Dredge HF Cardiology: Dr. Shirlee Latch  HPI:  45 y.o. with history of cardiomyopathy, heavy ETOH use, paroxysmal atrial fibrillation and flutter with bradycardia, and frequent PVCs.  Patient has a long EP history.  He has paroxysmal atrial fibrillation with slow ventricular response.  He had atrial flutter ablation in 2012 and also has atrial tachycardia with focus near the AV node that has not been ablated.  He had another ablation of atypical atrial flutter in 4/17.  He had a holter in 5/15 showing a 3.8 second pause, predominant atrial fibrillation, and 39% PVCs (some of this was likely atrial fibrillation with aberrancy).  He had runs of NSVT.  He is now on dofetilide. He had a Medtronic CRT-D device placed with his bundle lead.  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  Cardiac MRI in 8/15 did not show evidence for ARVC.    He was referred to CHF clinic initially for management of his cardiomyopathy.  Echo in 09/2013 suggested EF 30% but difficult to quantify due to PVCs.  Cardiac MRI in 10/2013 showed EF 45% with normal RV and a nonspecific RV insertion site delayed enhancement pattern.  He stopped ETOH completely.  Echo in 04/2016 showed EF 50-55%.  Echo in 07/2018 showed EF 45-50%, normal RV.  Echo was done 05/26/2019 and reviewed; EF 35-40%, mild LV dilation, mild LVH, normal RV.   He recently returned to HF Clinic for followup of CHF. Reported feeling good overall.  Was able to walk for 2 miles with no dyspnea on the treadmill.  No chest pain.  Rare fluttering in his chest.  No lightheadedness. He was drinking more heavily, 6+ drinks/day.  Weight was stable.  Today he returns to HF clinic for pharmacist medication titration. At last visit with MD, Sherryll Burger was increased to 97/103 mg BID.  Overall he is feeling well today. Believes that increasing the Sherryll Burger has made him feel better. Has also cut his alcohol intake in half and has been  walking 5K ~4-5 times per week. No dizziness or lightheadedness. Occasional chest tightness, but this is sporadic and seems to be associated with anxiety. No SOB/DOE. Weight has been stable at ~262 lbs. Not taking any diuretic. No LEE, PND or orthopnea.   HF Medications: Carvedilol 6.25 mg BID Entresto 97/103 mg BID Spironolactone 25 mg daily  Has the patient been experiencing any side effects to the medications prescribed?  no  Does the patient have any problems obtaining medications due to transportation or finances?   No - has Nurse, learning disability. Has Entresto copay card. Ned Clines copay card and 30 day free card today.   Understanding of regimen: good Understanding of indications: good Potential of compliance: good Patient understands to avoid NSAIDs. Patient understands to avoid decongestants.    Pertinent Lab Values: . 06/04/20: Serum creatinine 0.60, BUN 13, Potassium 4.0, Sodium 138   Vital Signs: . Weight: 262.2 lbs (last clinic weight: 262 lbs) . Blood pressure: 128/72  . Heart rate: 75   Assessment/Plan: 1. 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  He has a Medtronic ICD with His bundle lead.  Of note, heavy ETOH likely contributes to cardiomyopathy.  Echo 05/25/20 showed EF down to 35-40% in the setting of more heavy ETOH use.   -  NYHA class I-II currrently, not volume overloaded.  - Has cut back on ETOH.  He has LMNA cardiomyopathy, but EF appears to trend down when he is drinking more heavily.  - Not taking any diuretic currently - Continue carvedilol 6.25 mg BID.  - Continue Entresto 97/103 mg BID.   - Continue spironolactone 25 mg daily.  - Start Farxiga 10 mg daily.   2. Atrial fibrillation: Paroxysmal atrial fibrillation with bradycardic ventricular  response, now with His bundle pacing.  - He is on Eliquis with no bleeding sequelae. CBC stable recently.  - Continue dofetilide, recent ECG at Dr. Jenel Lucks office was ok.   - Needs to cut back on ETOH as heavy ETOH will increase risk of recurrent atrial fibrillation.  3. Atrial flutter: s/p ablation most recently in 06/2015.   4. Atrial tachycardia: Prior history, mapped to area near AV node.  5. 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   7. Hyperlipidemia: Good lipids in 02/2020.    Karle Plumber, PharmD, BCPS, BCCP, CPP Heart Failure Clinic Pharmacist [phone]

## 2020-06-23 ENCOUNTER — Other Ambulatory Visit: Payer: Self-pay

## 2020-06-23 ENCOUNTER — Ambulatory Visit (HOSPITAL_COMMUNITY)
Admission: RE | Admit: 2020-06-23 | Discharge: 2020-06-23 | Disposition: A | Discharge status: Home / Self Care | Payer: 59 | Source: Ambulatory Visit | Attending: Cardiology | Admitting: Cardiology

## 2020-06-23 ENCOUNTER — Telehealth (HOSPITAL_COMMUNITY): Payer: Self-pay | Admitting: Pharmacy Technician

## 2020-06-23 VITALS — BP 128/72 | HR 75 | Wt 262.2 lb

## 2020-06-23 DIAGNOSIS — I480 Paroxysmal atrial fibrillation: Secondary | ICD-10-CM | POA: Diagnosis not present

## 2020-06-23 DIAGNOSIS — I5022 Chronic systolic (congestive) heart failure: Secondary | ICD-10-CM

## 2020-06-23 DIAGNOSIS — I472 Ventricular tachycardia: Secondary | ICD-10-CM | POA: Insufficient documentation

## 2020-06-23 DIAGNOSIS — Z79899 Other long term (current) drug therapy: Secondary | ICD-10-CM | POA: Insufficient documentation

## 2020-06-23 DIAGNOSIS — Z1589 Genetic susceptibility to other disease: Secondary | ICD-10-CM | POA: Diagnosis not present

## 2020-06-23 DIAGNOSIS — Z7289 Other problems related to lifestyle: Secondary | ICD-10-CM | POA: Diagnosis not present

## 2020-06-23 DIAGNOSIS — Z7901 Long term (current) use of anticoagulants: Secondary | ICD-10-CM | POA: Insufficient documentation

## 2020-06-23 DIAGNOSIS — Z8241 Family history of sudden cardiac death: Secondary | ICD-10-CM | POA: Diagnosis not present

## 2020-06-23 DIAGNOSIS — I429 Cardiomyopathy, unspecified: Secondary | ICD-10-CM | POA: Insufficient documentation

## 2020-06-23 DIAGNOSIS — I471 Supraventricular tachycardia: Secondary | ICD-10-CM | POA: Diagnosis not present

## 2020-06-23 DIAGNOSIS — E785 Hyperlipidemia, unspecified: Secondary | ICD-10-CM | POA: Diagnosis not present

## 2020-06-23 DIAGNOSIS — Z95810 Presence of automatic (implantable) cardiac defibrillator: Secondary | ICD-10-CM | POA: Insufficient documentation

## 2020-06-23 DIAGNOSIS — I493 Ventricular premature depolarization: Secondary | ICD-10-CM | POA: Diagnosis not present

## 2020-06-23 MED ORDER — ENTRESTO 97-103 MG PO TABS: 1.0000 | ORAL_TABLET | Freq: Two times a day (BID) | ORAL | 3 refills | Status: DC

## 2020-06-23 MED ORDER — DAPAGLIFLOZIN PROPANEDIOL 10 MG PO TABS
10.0000 mg | ORAL_TABLET | Freq: Every day | ORAL | 3 refills | Status: DC
Start: 2020-06-23 — End: 2021-04-07

## 2020-06-23 NOTE — Telephone Encounter (Signed)
Patient was seen in clinic today and started on Farxiga. The current 30 day co-pay is $50. The patient was given a co-pay card to lower the cost.  Archer Asa, CPhT

## 2020-06-23 NOTE — Patient Instructions (Signed)
It was a pleasure seeing you today!  MEDICATIONS: -We are changing your medications today -Start Farxiga 10 mg (1 tablet) daily -Call if you have questions about your medications.   NEXT APPOINTMENT: Return to clinic in 1 month with Dr. McLean.  In general, to take care of your heart failure: -Limit your fluid intake to 2 Liters (half-gallon) per day.   -Limit your salt intake to ideally 2-3 grams (2000-3000 mg) per day. -Weigh yourself daily and record, and bring that "weight diary" to your next appointment.  (Weight gain of 2-3 pounds in 1 day typically means fluid weight.) -The medications for your heart are to help your heart and help you live longer.   -Please contact us before stopping any of your heart medications.  Call the clinic at [phone] with questions or to reschedule future appointments.  

## 2020-06-24 ENCOUNTER — Other Ambulatory Visit (HOSPITAL_COMMUNITY): Payer: Self-pay | Admitting: Cardiology

## 2020-07-19 ENCOUNTER — Other Ambulatory Visit (HOSPITAL_COMMUNITY): Payer: Self-pay

## 2020-07-27 ENCOUNTER — Encounter (HOSPITAL_COMMUNITY): Payer: Self-pay | Admitting: Cardiology

## 2020-07-27 ENCOUNTER — Other Ambulatory Visit: Payer: Self-pay

## 2020-07-27 ENCOUNTER — Ambulatory Visit (HOSPITAL_COMMUNITY)
Admission: RE | Admit: 2020-07-27 | Discharge: 2020-07-27 | Disposition: A | Discharge status: Home / Self Care | Payer: 59 | Source: Ambulatory Visit | Attending: Cardiology | Admitting: Cardiology

## 2020-07-27 VITALS — BP 120/78 | HR 74 | Wt 261.4 lb

## 2020-07-27 DIAGNOSIS — Z8616 Personal history of COVID-19: Secondary | ICD-10-CM | POA: Insufficient documentation

## 2020-07-27 DIAGNOSIS — I4892 Unspecified atrial flutter: Secondary | ICD-10-CM | POA: Insufficient documentation

## 2020-07-27 DIAGNOSIS — I482 Chronic atrial fibrillation, unspecified: Secondary | ICD-10-CM | POA: Diagnosis not present

## 2020-07-27 DIAGNOSIS — I480 Paroxysmal atrial fibrillation: Secondary | ICD-10-CM | POA: Insufficient documentation

## 2020-07-27 DIAGNOSIS — Z8249 Family history of ischemic heart disease and other diseases of the circulatory system: Secondary | ICD-10-CM | POA: Diagnosis not present

## 2020-07-27 DIAGNOSIS — Z95810 Presence of automatic (implantable) cardiac defibrillator: Secondary | ICD-10-CM | POA: Insufficient documentation

## 2020-07-27 DIAGNOSIS — I471 Supraventricular tachycardia: Secondary | ICD-10-CM | POA: Diagnosis not present

## 2020-07-27 DIAGNOSIS — Z7901 Long term (current) use of anticoagulants: Secondary | ICD-10-CM | POA: Insufficient documentation

## 2020-07-27 DIAGNOSIS — E785 Hyperlipidemia, unspecified: Secondary | ICD-10-CM | POA: Diagnosis not present

## 2020-07-27 DIAGNOSIS — I428 Other cardiomyopathies: Secondary | ICD-10-CM

## 2020-07-27 DIAGNOSIS — I493 Ventricular premature depolarization: Secondary | ICD-10-CM | POA: Diagnosis not present

## 2020-07-27 DIAGNOSIS — Z79899 Other long term (current) drug therapy: Secondary | ICD-10-CM | POA: Insufficient documentation

## 2020-07-27 LAB — BASIC METABOLIC PANEL
Anion gap: 7 (ref 5–15)
BUN: 9 mg/dL (ref 6–20)
CO2: 26 mmol/L (ref 22–32)
Calcium: 9.4 mg/dL (ref 8.9–10.3)
Chloride: 104 mmol/L (ref 98–111)
Creatinine, Ser: 0.59 mg/dL — ABNORMAL LOW (ref 0.61–1.24)
GFR, Estimated: >60 mL/min (ref >60)
Glucose, Bld: 99 mg/dL (ref 70–99)
Potassium: 3.9 mmol/L (ref 3.5–5.1)
Sodium: 137 mmol/L (ref 135–145)

## 2020-07-27 MED ORDER — CARVEDILOL 12.5 MG PO TABS: 12.5000 mg | ORAL_TABLET | Freq: Two times a day (BID) | ORAL | 3 refills | Status: DC

## 2020-07-27 NOTE — Patient Instructions (Signed)
INCREASE Coreg to 9.375 (1 1/2 tabs) twice a day for 4 days. (of current pills at home)  THEN START Coreg 12.5mg  (1 tab) twice a day after that. New script sent to pharmacy  Labs today We will only contact you if something comes back abnormal or we need to make some changes. Otherwise no news is good news!  Your physician has requested that you have an echocardiogram. Echocardiography is a painless test that uses sound waves to create images of your heart. It provides your doctor with information about the size and shape of your heart and how well your heart's chambers and valves are working. This procedure takes approximately one hour. There are no restrictions for this procedure.   Your physician recommends that you schedule a follow-up appointment in: 3 months and an echo  Please call office at [phone] option 2 if you have any questions or concerns.   At the [facility], you and your health needs are our priority. As part of our continuing mission to provide you with exceptional heart care, we have created designated Provider Care Teams. These Care Teams include your primary Cardiologist (physician) and Advanced Practice Providers (APPs- Physician Assistants and Nurse Practitioners) who all work together to provide you with the care you need, when you need it.   You may see any of the following providers on your designated Care Team at your next follow up: [name] Dr Arvilla Meres . Dr Marca Ancona . Dr Thornell Mule . Tonye Becket, NP . Robbie Lis, PA . Shanda Bumps [place],NP . Karle Plumber, PharmD   Please be sure to bring in all your medications bottles to every appointment.

## 2020-07-28 NOTE — Progress Notes (Signed)
Patient ID: Donald Grant, male   DOB: May 24, 1975, 45 y.o.   MRN: [id number] PCP: Dr. Duaine Dredge HF Cardiology: Dr. Shirlee Latch  45 y.o. with history of cardiomyopathy, heavy ETOH use, paroxysmal atrial fibrillation and flutter with bradycardia, and frequent PVCs.  Patient has a long EP history.  He has paroxysmal atrial fibrillation with slow ventricular response.  45 y.o. and also has atrial tachycardia with focus near the AV node that has not been ablated.  He had another ablation of atypical atrial flutter in 4/17.  He had a holter in 5/15 showing a 3.8 second pause, predominant atrial fibrillation, and 39% PVCs (some of this was likely atrial fibrillation with aberrancy).  He had runs of NSVT.  He is now on dofetilide. He had a Medtronic CRT-D device placed with his bundle lead.  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  Cardiac MRI in 8/15 did not show evidence for ARVC.    He was referred to CHF clinic initially for management of his cardiomyopathy.  Echo in 7/15 suggested EF 30% but difficult to quantify due to PVCs.  Cardiac MRI in 8/15 showed EF 45% with normal RV and a nonspecific RV insertion site delayed enhancement pattern.  He stopped ETOH completely.  Echo in 2/18 showed EF 50-55%.  Echo in 5/20 showed EF 45-50%, normal RV.  Echo in 3/22 showed worsening LV function with EF 35-40%, mild LV dilation, mild LVH, normal RV.  Patient was drinking more.   He returns for followup of CHF. I asked him to cut back on ETOH at last visit.  He has not quit drinking but has cut back considerably.  No exertional dyspnea or chest pain.  No lightheadedness.  No orthopnea/PND.      Medtronic device interrogation: >99% BiV pacing, no AF, no VT, stable thoracic impedance  ECG (personally reviewed): A-V sequential pacing, QTc 483  Labs (5/15): K 3.9, creatinine 0.7 Labs (8/15): creatinine 0.9 Labs (9/15): K 4.3, creatinine  0.83, pBNP 700 Labs (8/17): K 4.2, creatinine 0.71, HCT 42.2 Labs (11/17): hgb 15.4 Labs (12/19): K 4.1, creatinine 0.73 Labs (3/20): LDL 30 Labs (11/21): K 4.4, creatinine 0.87, hgb 14.3 Labs (12/21): LDL 48, HDL 42 Labs (1/22): K 4.4, creatinine 0.74 Labs (3/22): K 4, creatinine 0.6  PMH: 1. History of  heavy ETOH use.  2. Atrial fibrillation: Paroxysmal.  CHADSVASC = 2, he is on Pradaxa.   3. HTN 4. Atrial flutter: s/p ablation 11/12.  He had ablation of atypical atrial flutter again in 4/17.  5. Atrial tachycardia: Mapped to AV node, not ablated.  6. Ventricular arrhythmias: NSVT, also very frequent PVCs. Some of beats that seem to be PVCs are likely aberrantly conducted atrial fibrillation.  Holter (5/15) with 3.8 second pause, atrial fibrillation predominantly, 39% PVCs (some of this likely is atrial fibrillation with aberrancy), runs of WCT (suspected NSVT).  7. Cardiomyopathy: Echo (7/15) with EF 30%, mild diffuse hypokinesis, mild LV dilation.  Cardiac MRI (8/15) with moderate LV dilation, EF 45%, diffuse hypokinesis, normal RV with no evidence for ARVC, delayed enhancement at the RV insertion sites which is a nonspecific pattern.  Lexiscan Cardiolite (5/15) with mild scar in the inferior and inferoseptal walls, no ischemia, not gated due to PVCs.  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   - Medtronic CRT-D with His bundle lead.  - Echo (5/20): EF 45-50%, normal RV. -  Echo (3/22): EF 35-40%, mild LV dilation/mild LVH, normal RV.  8. Gout 9. 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  No tobacco.  Occasional marijuana.  Never used cocaine.  Heavy ETOH use.   FH: Mother with SCD in her 46s and dilated cardiomyopathy.  Maternal grandmother with SCD in her 63s, great-aunt with SCD, sister with atrial fibrillation and cardiomyopathy.    ROS: All systems reviewed and negative except as per HPI.   Current Outpatient  Medications  Medication Sig Dispense Refill  . acyclovir (ZOVIRAX) 400 MG tablet Take 400 mg by mouth 2 (two) times daily as needed (outbreak).     [name] allopurinol (ZYLOPRIM) 300 MG tablet Take 300 mg by mouth daily.     [name] ALPRAZolam (XANAX) 0.5 MG tablet Take 0.25 tablets by mouth daily as needed for anxiety.   1  . apixaban (ELIQUIS) 5 MG TABS tablet Take 1 tablet (5 mg total) by mouth 2 (two) times daily. 180 tablet 1  . B Complex-C (B-COMPLEX WITH VITAMIN C) tablet Take 1 tablet by mouth at bedtime.    . calcium carbonate (TUMS - DOSED IN MG ELEMENTAL CALCIUM) 500 MG chewable tablet Chew 2-3 tablets by mouth daily as needed for indigestion or heartburn.    Melene Muller ON 08/01/2020] carvedilol (COREG) 12.5 MG tablet Take 1 tablet (12.5 mg total) by mouth 2 (two) times daily. 180 tablet 3  . Cholecalciferol (VITAMIN D) 2000 units CAPS Take 4,000 Units by mouth in the morning and at bedtime.     . dapagliflozin propanediol (FARXIGA) 10 MG TABS tablet Take 1 tablet (10 mg total) by mouth daily. 90 tablet 3  . dofetilide (TIKOSYN) 500 MCG capsule TAKE 1 CAPSULE BY MOUTH TWICE A DAY 180 capsule 3  . fish oil-omega-3 fatty acids 1000 MG capsule Take 2,000 mg by mouth 2 (two) times daily.    . magnesium oxide (MAG-OX) 400 MG tablet TAKE 1 TABLET BY MOUTH EVERY DAY 90 tablet 3  . Multiple Vitamin (MULITIVITAMIN WITH MINERALS) TABS Take 1 tablet by mouth every evening.    . Psyllium (METAMUCIL PO) Take 8.4 g by mouth 3 (three) times a week. Use 2 teaspoons into water once daily    . rosuvastatin (CRESTOR) 40 MG tablet Take 40 mg by mouth every evening.    . sacubitril-valsartan (ENTRESTO) 97-103 MG Take 1 tablet by mouth 2 (two) times daily. 180 tablet 3  . spironolactone (ALDACTONE) 25 MG tablet TAKE 1 TABLET BY MOUTH EVERY DAY 90 tablet 1   No current facility-administered medications for this encounter.    BP 120/78   Pulse 74   Wt 118.6 kg (261 lb 6.4 oz)   SpO2 96%   BMI 32.67 kg/m  General:  NAD Neck: No JVD, no thyromegaly or thyroid nodule.  Lungs: Clear to auscultation bilaterally with normal respiratory effort. CV: Nondisplaced PMI.  Heart regular S1/S2, no S3/S4, no murmur.  No peripheral edema.  No carotid bruit.  Normal pedal pulses.  Abdomen: Soft, nontender, no hepatosplenomegaly, no distention.  Skin: Intact without lesions or rashes.  Neurologic: Alert and oriented x 3.  Psych: Normal affect. Extremities: No clubbing or cyanosis.  HEENT: Normal.   Assessment/Plan:  1. 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  He has a Medtronic ICD with His bundle lead.  Of note, heavy ETOH likely contributes to cardiomyopathy.  Echo in 3/22 showed EF down to 35-40% in the setting of more heavy ETOH use.  NYHA class I currrently, not volume overloaded.  He has LMNA cardiomyopathy, but EF appears to trend down when he is drinking more heavily.  - He has cut back but not quit ETOH.  I urged him to continue to decrease his drinking.  - Increase Coreg to 9.375 mg bid x 4 days then 12.5 mg bid after that.   - Continue Entresto 97/103 bid.  BMET today. - Continue spironolactone 25 mg daily.  - Continue dapagliflozin 10 mg daily.    - Repeat echo in 3 months, minimize ETOH during that time.  2. Atrial fibrillation: Paroxysmal atrial fibrillation with bradycardic ventricular response, now with His bundle pacing.  He is in NSR today.   - He is on Eliquis with no bleeding sequelae.  - Continue dofetilide, QTc ok on ECG today.   - Needs to cut back on ETOH as heavy ETOH will increase risk of recurrent atrial fibrillation.  3. Atrial flutter: s/p ablation most recently in 4/17.   4. Atrial tachycardia: Prior history, mapped to area near AV node.  5. 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   7. Hyperlipidemia: Good lipids in 12/21.   Followup 3 months with echo.   Marca Ancona 07/28/2020

## 2020-08-19 ENCOUNTER — Ambulatory Visit (INDEPENDENT_AMBULATORY_CARE_PROVIDER_SITE_OTHER): Payer: 59

## 2020-08-19 DIAGNOSIS — I440 Atrioventricular block, first degree: Secondary | ICD-10-CM

## 2020-08-19 DIAGNOSIS — Z950 Presence of cardiac pacemaker: Secondary | ICD-10-CM | POA: Diagnosis not present

## 2020-08-19 LAB — CUP PACEART REMOTE DEVICE CHECK
Battery Remaining Longevity: 77 mo
Battery Voltage: 3 V
Brady Statistic AP VP Percent: 99.54 %
Brady Statistic AP VS Percent: 0.37 %
Brady Statistic AS VP Percent: 0.09 %
Brady Statistic AS VS Percent: 0 %
Brady Statistic RA Percent Paced: 99.89 %
Brady Statistic RV Percent Paced: 98.39 %
Date Time Interrogation Session: 20220526012505
HighPow Impedance: 74 Ohm
Implantable Lead Implant Date: 20160628
Implantable Lead Implant Date: 20160628
Implantable Lead Implant Date: 20160628
Implantable Lead Location: 753858
Implantable Lead Location: 753859
Implantable Lead Location: 753860
Implantable Lead Model: 3830
Implantable Lead Model: 5076
Implantable Pulse Generator Implant Date: 20211124
Lead Channel Impedance Value: 247 Ohm
Lead Channel Impedance Value: 304 Ohm
Lead Channel Impedance Value: 399 Ohm
Lead Channel Impedance Value: 456 Ohm
Lead Channel Impedance Value: 475 Ohm
Lead Channel Impedance Value: 513 Ohm
Lead Channel Pacing Threshold Amplitude: 1 V
Lead Channel Pacing Threshold Pulse Width: 0.8 ms
Lead Channel Sensing Intrinsic Amplitude: 1.375 mV
Lead Channel Sensing Intrinsic Amplitude: 1.375 mV
Lead Channel Sensing Intrinsic Amplitude: 2.5 mV
Lead Channel Sensing Intrinsic Amplitude: 2.5 mV
Lead Channel Setting Pacing Amplitude: 0.5 V
Lead Channel Setting Pacing Amplitude: 2 V
Lead Channel Setting Pacing Amplitude: 2.5 V
Lead Channel Setting Pacing Pulse Width: 0.03 ms
Lead Channel Setting Pacing Pulse Width: 0.8 ms
Lead Channel Setting Sensing Sensitivity: 0.3 mV

## 2020-09-14 NOTE — Progress Notes (Signed)
Remote ICD transmission.   

## 2020-10-12 ENCOUNTER — Other Ambulatory Visit: Payer: Self-pay | Admitting: Internal Medicine

## 2020-10-12 NOTE — Telephone Encounter (Signed)
Prescription refill request for Eliquis received.  Indication:afib  Last office visit: [place], 07/27/2020 Scr: 0.59, 07/27/2020 Age: 45 yo  Weight:  118.6 kg   Pt is on the correct dose of Eliquis per dosing criteria, prescription refill sent for Eliquis 5mg  BID.

## 2020-10-25 ENCOUNTER — Ambulatory Visit (HOSPITAL_BASED_OUTPATIENT_CLINIC_OR_DEPARTMENT_OTHER)
Admission: RE | Admit: 2020-10-25 | Discharge: 2020-10-25 | Disposition: A | Discharge status: Home / Self Care | Payer: 59 | Source: Ambulatory Visit | Attending: Cardiology | Admitting: Cardiology

## 2020-10-25 ENCOUNTER — Other Ambulatory Visit: Payer: Self-pay

## 2020-10-25 ENCOUNTER — Ambulatory Visit (HOSPITAL_COMMUNITY)
Admission: RE | Admit: 2020-10-25 | Discharge: 2020-10-25 | Disposition: A | Discharge status: Home / Self Care | Payer: 59 | Source: Ambulatory Visit | Attending: Family Medicine | Admitting: Family Medicine

## 2020-10-25 ENCOUNTER — Encounter (HOSPITAL_COMMUNITY): Payer: Self-pay | Admitting: Cardiology

## 2020-10-25 VITALS — BP 110/72 | HR 71 | Wt 257.6 lb

## 2020-10-25 DIAGNOSIS — Z8616 Personal history of COVID-19: Secondary | ICD-10-CM | POA: Insufficient documentation

## 2020-10-25 DIAGNOSIS — Z87891 Personal history of nicotine dependence: Secondary | ICD-10-CM | POA: Diagnosis not present

## 2020-10-25 DIAGNOSIS — I5022 Chronic systolic (congestive) heart failure: Secondary | ICD-10-CM | POA: Diagnosis not present

## 2020-10-25 DIAGNOSIS — I110 Hypertensive heart disease with heart failure: Secondary | ICD-10-CM | POA: Diagnosis not present

## 2020-10-25 DIAGNOSIS — I428 Other cardiomyopathies: Secondary | ICD-10-CM | POA: Diagnosis not present

## 2020-10-25 DIAGNOSIS — E785 Hyperlipidemia, unspecified: Secondary | ICD-10-CM | POA: Diagnosis not present

## 2020-10-25 DIAGNOSIS — Z7984 Long term (current) use of oral hypoglycemic drugs: Secondary | ICD-10-CM | POA: Diagnosis not present

## 2020-10-25 DIAGNOSIS — I493 Ventricular premature depolarization: Secondary | ICD-10-CM | POA: Insufficient documentation

## 2020-10-25 DIAGNOSIS — I509 Heart failure, unspecified: Secondary | ICD-10-CM | POA: Insufficient documentation

## 2020-10-25 DIAGNOSIS — I4892 Unspecified atrial flutter: Secondary | ICD-10-CM | POA: Insufficient documentation

## 2020-10-25 DIAGNOSIS — Z95810 Presence of automatic (implantable) cardiac defibrillator: Secondary | ICD-10-CM | POA: Insufficient documentation

## 2020-10-25 DIAGNOSIS — Z79899 Other long term (current) drug therapy: Secondary | ICD-10-CM | POA: Insufficient documentation

## 2020-10-25 DIAGNOSIS — I480 Paroxysmal atrial fibrillation: Secondary | ICD-10-CM

## 2020-10-25 DIAGNOSIS — F1290 Cannabis use, unspecified, uncomplicated: Secondary | ICD-10-CM | POA: Diagnosis not present

## 2020-10-25 DIAGNOSIS — Z7901 Long term (current) use of anticoagulants: Secondary | ICD-10-CM | POA: Diagnosis not present

## 2020-10-25 DIAGNOSIS — I471 Supraventricular tachycardia: Secondary | ICD-10-CM | POA: Insufficient documentation

## 2020-10-25 LAB — ECHOCARDIOGRAM COMPLETE
Area-P 1/2: 3.95 cm2
Calc EF: 37.5 %
S' Lateral: 5.1 cm
Single Plane A2C EF: 46.7 %
Single Plane A4C EF: 35 %

## 2020-10-25 LAB — BASIC METABOLIC PANEL
Anion gap: 11 (ref 5–15)
BUN: 10 mg/dL (ref 6–20)
CO2: 23 mmol/L (ref 22–32)
Calcium: 9.5 mg/dL (ref 8.9–10.3)
Chloride: 100 mmol/L (ref 98–111)
Creatinine, Ser: 0.61 mg/dL (ref 0.61–1.24)
GFR, Estimated: >60 mL/min (ref >60)
Glucose, Bld: 100 mg/dL — ABNORMAL HIGH (ref 70–99)
Potassium: 4 mmol/L (ref 3.5–5.1)
Sodium: 134 mmol/L — ABNORMAL LOW (ref 135–145)

## 2020-10-25 MED ORDER — CARVEDILOL 12.5 MG PO TABS: 18.7500 mg | ORAL_TABLET | Freq: Two times a day (BID) | ORAL | 3 refills | Status: DC

## 2020-10-25 NOTE — Progress Notes (Signed)
Patient ID: Donald Grant, male   DOB: Nov 23, 1975, 45 y.o.   MRN: [id number] PCP: Dr. Duaine Dredge HF Cardiology: Dr. Shirlee Latch  44 y.o. with history of cardiomyopathy, heavy ETOH use, paroxysmal atrial fibrillation and flutter with bradycardia, and frequent PVCs.  Patient has a long EP history.  He has paroxysmal atrial fibrillation with slow ventricular response.  He had atrial flutter ablation in 2012 and also has atrial tachycardia with focus near the AV node that has not been ablated.  He had another ablation of atypical atrial flutter in 4/17.  He had a holter in 5/15 showing a 3.8 second pause, predominant atrial fibrillation, and 39% PVCs (some of this was likely atrial fibrillation with aberrancy).  He had runs of NSVT.  He is now on dofetilide. He had a Medtronic CRT-D device placed with his bundle lead.  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  Cardiac MRI in 8/15 did not show evidence for ARVC.    He was referred to CHF clinic initially for management of his cardiomyopathy.  Echo in 7/15 suggested EF 30% but difficult to quantify due to PVCs.  Cardiac MRI in 8/15 showed EF 45% with normal RV and a nonspecific RV insertion site delayed enhancement pattern.  He stopped ETOH completely.  Echo in 2/18 showed EF 50-55%.  Echo in 5/20 showed EF 45-50%, normal RV.  Echo in 3/22 showed worsening LV function with EF 35-40%, mild LV dilation, mild LVH, normal RV.  Patient was drinking more.   Echo was done today and reviewed, EF 40-45%, mild LV dilation, normal RV, PASP 24 mmHg, normal IVC.   He returns for followup of CHF. He is still drinking some but has cut back.  He stays very busy with work.  No significant exertional dyspnea.  No palpitations.  No lightheadedness.  No chest pain.    Medtronic device interrogation: >99% BiV pacing, no AF, no VT, stable thoracic impedance.   ECG (personally reviewed): A-BiV sequentially pacing, QTc 475  msec  Labs (5/15): K 3.9, creatinine 0.7 Labs (8/15): creatinine 0.9 Labs (9/15): K 4.3, creatinine 0.83, pBNP 700 Labs (8/17): K 4.2, creatinine 0.71, HCT 42.2 Labs (11/17): hgb 15.4 Labs (12/19): K 4.1, creatinine 0.73 Labs (3/20): LDL 30 Labs (11/21): K 4.4, creatinine 0.87, hgb 14.3 Labs (12/21): LDL 48, HDL 42 Labs (1/22): K 4.4, creatinine 0.74 Labs (3/22): K 4, creatinine 0.6 Labs (5/22): K 3.9, creatinine 0.59  PMH: 1. History of  heavy ETOH use.  2. Atrial fibrillation: Paroxysmal.  CHADSVASC = 2, he is on Pradaxa.   3. HTN 4. Atrial flutter: s/p ablation 11/12.  He had ablation of atypical atrial flutter again in 4/17.  5. Atrial tachycardia: Mapped to AV node, not ablated.  6. Ventricular arrhythmias: NSVT, also very frequent PVCs. Some of beats that seem to be PVCs are likely aberrantly conducted atrial fibrillation.  Holter (5/15) with 3.8 second pause, atrial fibrillation predominantly, 39% PVCs (some of this likely is atrial fibrillation with aberrancy), runs of WCT (suspected NSVT).  7. Cardiomyopathy: Echo (7/15) with EF 30%, mild diffuse hypokinesis, mild LV dilation.  Cardiac MRI (8/15) with moderate LV dilation, EF 45%, diffuse hypokinesis, normal RV with no evidence for ARVC, delayed enhancement at the RV insertion sites which is a nonspecific pattern.  Lexiscan Cardiolite (5/15) with mild scar in the inferior and inferoseptal walls, no ischemia, not gated due to PVCs.  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   - Medtronic CRT-D with His bundle lead.  - Echo (5/20): EF 45-50%, normal RV. - Echo (3/22): EF 35-40%, mild LV dilation/mild LVH, normal RV.  - Echo (7/22): EF 40-45%, mild LV dilation, normal RV, PASP 24 mmHg, normal IVC 8. Gout 9. 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  No tobacco.  Occasional marijuana.  Never used cocaine.  Heavy ETOH use.   FH: Mother with SCD in her 77s and dilated  cardiomyopathy.  Maternal grandmother with SCD in her 50s, great-aunt with SCD, sister with atrial fibrillation and cardiomyopathy.    ROS: All systems reviewed and negative except as per HPI.   Current Outpatient Medications  Medication Sig Dispense Refill   acyclovir (ZOVIRAX) 400 MG tablet Take 400 mg by mouth 2 (two) times daily as needed (outbreak).      allopurinol (ZYLOPRIM) 300 MG tablet Take 300 mg by mouth daily.      ALPRAZolam (XANAX) 0.5 MG tablet Take 0.25 tablets by mouth daily as needed for anxiety.   1   B Complex-C (B-COMPLEX WITH VITAMIN C) tablet Take 1 tablet by mouth at bedtime.     calcium carbonate (TUMS - DOSED IN MG ELEMENTAL CALCIUM) 500 MG chewable tablet Chew 2-3 tablets by mouth daily as needed for indigestion or heartburn.     Cholecalciferol (VITAMIN D) 2000 units CAPS Take 4,000 Units by mouth in the morning and at bedtime.      dapagliflozin propanediol (FARXIGA) 10 MG TABS tablet Take 1 tablet (10 mg total) by mouth daily. 90 tablet 3   dofetilide (TIKOSYN) 500 MCG capsule TAKE 1 CAPSULE BY MOUTH TWICE A DAY 180 capsule 3   ELIQUIS 5 MG TABS tablet TAKE 1 TABLET BY MOUTH TWICE A DAY 60 tablet 5   fish oil-omega-3 fatty acids 1000 MG capsule Take 2,000 mg by mouth 2 (two) times daily.     magnesium oxide (MAG-OX) 400 MG tablet TAKE 1 TABLET BY MOUTH EVERY DAY 90 tablet 3   Multiple Vitamin (MULITIVITAMIN WITH MINERALS) TABS Take 1 tablet by mouth every evening.     Psyllium (METAMUCIL PO) Take 8.4 g by mouth 3 (three) times a week. Use 2 teaspoons into water once daily     rosuvastatin (CRESTOR) 40 MG tablet Take 40 mg by mouth every evening.     sacubitril-valsartan (ENTRESTO) 97-103 MG Take 1 tablet by mouth 2 (two) times daily. 180 tablet 3   spironolactone (ALDACTONE) 25 MG tablet TAKE 1 TABLET BY MOUTH EVERY DAY 90 tablet 1   carvedilol (COREG) 12.5 MG tablet Take 1.5 tablets (18.75 mg total) by mouth 2 (two) times daily. 270 tablet 3   No current  facility-administered medications for this encounter.    BP 110/72   Pulse 71   Wt 116.8 kg (257 lb 9.6 oz)   SpO2 97%   BMI 32.20 kg/m  General: NAD Neck: No JVD, no thyromegaly or thyroid nodule.  Lungs: Clear to auscultation bilaterally with normal respiratory effort. CV: Nondisplaced PMI.  Heart regular S1/S2, no S3/S4, no murmur.  No peripheral edema.  No carotid bruit.  Normal pedal pulses.  Abdomen: Soft, nontender, no hepatosplenomegaly, no distention.  Skin: Intact without lesions or rashes.  Neurologic: Alert and oriented x 3.  Psych: Normal affect. Extremities: No clubbing or cyanosis.  HEENT: Normal.   Assessment/Plan:  1. 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  He has a Medtronic ICD with His bundle lead.  Of note, heavy ETOH likely contributes to cardiomyopathy.  Echo in 3/22 showed EF down to 35-40% in the setting of more heavy ETOH use.  Echo today showed some improvement with EF 40-45%, normal RV.  NYHA class I currrently, not volume overloaded.  He has LMNA cardiomyopathy but think ETOH has also contributed to low EF. Weight down 4 lbs.  - He has cut back but not quit ETOH.  I urged him to continue to decrease his drinking.  - Increase Coreg to 18.75 mg bid.    - Continue Entresto 97/103 bid.  BMET today. - Continue spironolactone 25 mg daily. BMET today.  - Continue dapagliflozin 10 mg daily.    2. Atrial fibrillation: Paroxysmal atrial fibrillation with bradycardic ventricular response, now with His bundle pacing.  He is in NSR today.   - He is on Eliquis with no bleeding sequelae.  - Continue dofetilide, QTc ok on ECG today.   - Needs to cut back on ETOH as much as possible as heavy ETOH will increase risk of recurrent atrial fibrillation.   3. Atrial flutter: s/p ablation most recently in 4/17.   4. Atrial tachycardia: Prior history, mapped to area near AV node.  5. 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   7. Hyperlipidemia: Good lipids in 12/21.   Followup with HF pharmacist in 2 months to try to increase Coreg to 25 mg bid, then see me in 6 months.   Marca Ancona 10/25/2020

## 2020-10-25 NOTE — Patient Instructions (Signed)
Labs done today. We will contact you only if your labs are abnormal.  INCREASE Carvedilol to 18.75mg  (1 & 1/2 tablet) by mouth 2 times daily.   No other medication changes were made. Please continue all current medications as prescribed.  Your physician recommends that you schedule a follow-up appointment in: 2 months for a pharmacy appointment and in 6 months for an appointment with Dr. Shirlee Latch. Please contact our office in January 2023 for a February appointment.   If you have any questions or concerns before your next appointment please send [place] a message through [place] or call our office at [phone].    TO LEAVE A MESSAGE FOR THE NURSE SELECT OPTION 2, PLEASE LEAVE A MESSAGE INCLUDING: YOUR NAME DATE OF BIRTH CALL BACK NUMBER REASON FOR CALL**this is important as we prioritize the call backs  YOU WILL RECEIVE A CALL BACK THE SAME DAY AS LONG AS YOU CALL BEFORE 4:00 PM   Do the following things EVERYDAY: Weigh yourself in the morning before breakfast. Write it down and keep it in a log. Take your medicines as prescribed Eat low salt foods--Limit salt (sodium) to 2000 mg per day.  Stay as active as you can everyday Limit all fluids for the day to less than 2 liters   At the [facility], you and your health needs are our priority. As part of our continuing mission to provide you with exceptional heart care, we have created designated Provider Care Teams. These Care Teams include your primary Cardiologist (physician) and Advanced Practice Providers (APPs- Physician Assistants and Nurse Practitioners) who all work together to provide you with the care you need, when you need it.   You may see any of the following providers on your designated Care Team at your next follow up: Dr Arvilla Meres Dr Carron Curie, NP Robbie Lis, [place] Karle Plumber, PharmD   Please be sure to bring in all your medications bottles to every appointment.

## 2020-10-25 NOTE — Progress Notes (Signed)
  Echocardiogram 2D Echocardiogram has been performed.  Donald Grant 10/25/2020, 10:34 AM

## 2020-11-09 ENCOUNTER — Other Ambulatory Visit: Payer: Self-pay

## 2020-11-18 ENCOUNTER — Ambulatory Visit (INDEPENDENT_AMBULATORY_CARE_PROVIDER_SITE_OTHER): Payer: 59

## 2020-11-18 DIAGNOSIS — I495 Sick sinus syndrome: Secondary | ICD-10-CM

## 2020-11-18 LAB — CUP PACEART REMOTE DEVICE CHECK
Battery Remaining Longevity: 70 mo
Battery Voltage: 2.99 V
Brady Statistic AP VP Percent: 99.05 %
Brady Statistic AP VS Percent: 0.27 %
Brady Statistic AS VP Percent: 0.67 %
Brady Statistic AS VS Percent: 0.01 %
Brady Statistic RA Percent Paced: 99.28 %
Brady Statistic RV Percent Paced: 99.54 %
Date Time Interrogation Session: 20220825062207
HighPow Impedance: 74 Ohm
Implantable Lead Implant Date: 20160628
Implantable Lead Implant Date: 20160628
Implantable Lead Implant Date: 20160628
Implantable Lead Location: 753858
Implantable Lead Location: 753859
Implantable Lead Location: 753860
Implantable Lead Model: 3830
Implantable Lead Model: 5076
Implantable Pulse Generator Implant Date: 20211124
Lead Channel Impedance Value: 228 Ohm
Lead Channel Impedance Value: 285 Ohm
Lead Channel Impedance Value: 361 Ohm
Lead Channel Impedance Value: 418 Ohm
Lead Channel Impedance Value: 418 Ohm
Lead Channel Impedance Value: 456 Ohm
Lead Channel Pacing Threshold Amplitude: 0.875 V
Lead Channel Pacing Threshold Pulse Width: 0.8 ms
Lead Channel Sensing Intrinsic Amplitude: 3.375 mV
Lead Channel Sensing Intrinsic Amplitude: 3.375 mV
Lead Channel Sensing Intrinsic Amplitude: 3.75 mV
Lead Channel Sensing Intrinsic Amplitude: 3.75 mV
Lead Channel Setting Pacing Amplitude: 0.5 V
Lead Channel Setting Pacing Amplitude: 2 V
Lead Channel Setting Pacing Amplitude: 2.5 V
Lead Channel Setting Pacing Pulse Width: 0.03 ms
Lead Channel Setting Pacing Pulse Width: 0.8 ms
Lead Channel Setting Sensing Sensitivity: 0.3 mV

## 2020-12-02 NOTE — Progress Notes (Signed)
Remote ICD transmission.   

## 2020-12-16 NOTE — Progress Notes (Signed)
PCP: Dr. Duaine Dredge HF Cardiology: Dr. Shirlee Latch  HPI:  45 y.o. with history of cardiomyopathy, heavy ETOH use, paroxysmal atrial fibrillation and flutter with bradycardia, and frequent PVCs.  Patient has a long EP history.  He has paroxysmal atrial fibrillation with slow ventricular response.  He had atrial flutter ablation in 2012 and also has atrial tachycardia with focus near the AV node that has not been ablated.  He had another ablation of atypical atrial flutter in 4/17.  He had a holter in 5/15 showing a 3.8 second pause, predominant atrial fibrillation, and 39% PVCs (some of this was likely atrial fibrillation with aberrancy).  He had runs of NSVT.  He is now on dofetilide. He had a Medtronic CRT-D device placed with his bundle lead.  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  Cardiac MRI in 8/15 did not show evidence for ARVC.     He was referred to CHF clinic initially for management of his cardiomyopathy.  Echo in 09/2013 suggested EF 30% but difficult to quantify due to PVCs.  Cardiac MRI in 10/2013 showed EF 45% with normal RV and a nonspecific RV insertion site delayed enhancement pattern.  He stopped ETOH completely.  Echo in 04/2016 showed EF 50-55%.  Echo in 07/2018 showed EF 45-50%, normal RV.  Echo was done 05/26/2019 and reviewed; EF 35-40%, mild LV dilation, mild LVH, normal RV. Patient was drinking more.   Echo 10/25/20 EF 40-45%, mild LV dilation, normal RV, PASP 24 mmHg, normal IVC.    He recently returned to HF Clinic for followup of CHF on 10/25/20. He was still drinking some but had cut back.  He reported staying very busy with work.  No significant exertional dyspnea.  No palpitations.  No lightheadedness.  No chest pain.    Today he returns to HF clinic for pharmacist medication titration. At last visit with MD, carvedilol was increased to 18.75 mg BID. Overall he is feeling well today. No dizziness, lightheadedness, chest pain or  palpitations. No SOB/DOE. Weight has been stable at home. Does not need any diuretic. No LEE, PND or orthopnea. Taking all medications as prescribed and tolerating all medications.    HF Medications: Carvedilol 18.75 mg BID Entresto 97/103 mg BID Spironolactone 25 mg daily Farxiga 10 mg daily  Has the patient been experiencing any side effects to the medications prescribed?  no  Does the patient have any problems obtaining medications due to transportation or finances?   No - has Nurse, learning disability. Has Sherryll Burger and Farxiga copay cards.    Understanding of regimen: good Understanding of indications: good Potential of compliance: good Patient understands to avoid NSAIDs. Patient understands to avoid decongestants.    Pertinent Lab Values: 10/25/20: Serum creatinine 0.61, BUN 10, Potassium 4.0, Sodium 134   Vital Signs: Weight: 264.4 lbs (last clinic weight: 262 lbs) Blood pressure: 110/68 Heart rate: 74   Assessment/Plan: 1. 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  He has a Medtronic ICD with His bundle lead.  Of note, heavy ETOH likely contributes to cardiomyopathy.  Echo 05/25/20 showed EF down to 35-40% in the setting of more heavy ETOH use.   Echo 10/25/20 showed some improvement with EF 40-45%, normal RV.  He has LMNA cardiomyopathy but likely ETOH has also contributed  to low EF. - NYHA class I currrently, not volume overloaded. - He has cut back but not quit ETOH.  Again urged him to continue to decrease his drinking.    - Not taking any diuretic currently - Increase carvedilol to 25 mg BID.  - Continue Entresto 97/103 mg BID.   - Continue spironolactone 25 mg daily.  - Continue Farxiga 10 mg daily.   2. Atrial fibrillation: Paroxysmal atrial fibrillation with  bradycardic ventricular response, now with His bundle pacing.  - He is on Eliquis with no bleeding sequelae. CBC stable recently.  - Continue dofetilide, QTc ok on ECG 10/25/20 - Needs to cut back on ETOH as heavy ETOH will increase risk of recurrent atrial fibrillation.  3. Atrial flutter: s/p ablation most recently in 06/2015.   4. Atrial tachycardia: Prior history, mapped to area near AV node.  5. 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   7. Hyperlipidemia: Good lipids in 02/2020.   Follow up 4 months with Dr. Jonn Shingles, PharmD, BCPS, [facility], C[facility] Heart Failure Clinic Pharmacist [phone]

## 2020-12-27 ENCOUNTER — Other Ambulatory Visit: Payer: Self-pay

## 2020-12-27 ENCOUNTER — Ambulatory Visit (HOSPITAL_COMMUNITY)
Admission: RE | Admit: 2020-12-27 | Discharge: 2020-12-27 | Disposition: A | Discharge status: Home / Self Care | Payer: 59 | Source: Ambulatory Visit | Attending: Internal Medicine | Admitting: Internal Medicine

## 2020-12-27 VITALS — BP 110/68 | HR 74 | Wt 264.4 lb

## 2020-12-27 DIAGNOSIS — I4892 Unspecified atrial flutter: Secondary | ICD-10-CM | POA: Diagnosis not present

## 2020-12-27 DIAGNOSIS — Z79899 Other long term (current) drug therapy: Secondary | ICD-10-CM | POA: Diagnosis not present

## 2020-12-27 DIAGNOSIS — E785 Hyperlipidemia, unspecified: Secondary | ICD-10-CM | POA: Diagnosis not present

## 2020-12-27 DIAGNOSIS — I429 Cardiomyopathy, unspecified: Secondary | ICD-10-CM | POA: Diagnosis not present

## 2020-12-27 DIAGNOSIS — I5022 Chronic systolic (congestive) heart failure: Secondary | ICD-10-CM

## 2020-12-27 DIAGNOSIS — I480 Paroxysmal atrial fibrillation: Secondary | ICD-10-CM | POA: Diagnosis not present

## 2020-12-27 DIAGNOSIS — I471 Supraventricular tachycardia: Secondary | ICD-10-CM | POA: Diagnosis not present

## 2020-12-27 DIAGNOSIS — Z95810 Presence of automatic (implantable) cardiac defibrillator: Secondary | ICD-10-CM | POA: Insufficient documentation

## 2020-12-27 DIAGNOSIS — Z7901 Long term (current) use of anticoagulants: Secondary | ICD-10-CM | POA: Diagnosis not present

## 2020-12-27 MED ORDER — CARVEDILOL 25 MG PO TABS: 25.0000 mg | ORAL_TABLET | Freq: Two times a day (BID) | ORAL | 3 refills | Status: DC

## 2020-12-27 NOTE — Patient Instructions (Signed)
It was a pleasure seeing you today!  MEDICATIONS: -We are changing your medications today -Increase carvedilol to 25 mg (1 tablet) twice daily. You may take 2 tablets of the 12.5 mg strength twice daily until you pick up the new strength.  -Call if you have questions about your medications.   NEXT APPOINTMENT: Return to clinic in 4 months with Dr. Shirlee Latch. Please call the HF Clinic at [phone] 1-2 months in advance to schedule.   In general, to take care of your heart failure: -Limit your fluid intake to 2 Liters (half-gallon) per day.   -Limit your salt intake to ideally 2-3 grams (2000-3000 mg) per day. -Weigh yourself daily and record, and bring that "weight diary" to your next appointment.  (Weight gain of 2-3 pounds in 1 day typically means fluid weight.) -The medications for your heart are to help your heart and help you live longer.   -Please contact us before stopping any of your heart medications.  Call the clinic at [phone] with questions or to reschedule future appointments.

## 2020-12-29 ENCOUNTER — Other Ambulatory Visit (HOSPITAL_COMMUNITY): Payer: Self-pay | Admitting: Cardiology

## 2021-03-22 ENCOUNTER — Ambulatory Visit (INDEPENDENT_AMBULATORY_CARE_PROVIDER_SITE_OTHER): Payer: 59

## 2021-03-22 DIAGNOSIS — I428 Other cardiomyopathies: Secondary | ICD-10-CM

## 2021-03-22 DIAGNOSIS — I5022 Chronic systolic (congestive) heart failure: Secondary | ICD-10-CM

## 2021-03-22 LAB — CUP PACEART REMOTE DEVICE CHECK
Battery Remaining Longevity: 67 mo
Battery Voltage: 2.99 V
Brady Statistic AP VP Percent: 99.17 %
Brady Statistic AP VS Percent: 0.19 %
Brady Statistic AS VP Percent: 0.64 %
Brady Statistic AS VS Percent: 0 %
Brady Statistic RA Percent Paced: 99.33 %
Brady Statistic RV Percent Paced: 99.46 %
Date Time Interrogation Session: 20221227033524
HighPow Impedance: 62 Ohm
Implantable Lead Implant Date: 20160628
Implantable Lead Implant Date: 20160628
Implantable Lead Implant Date: 20160628
Implantable Lead Location: 753858
Implantable Lead Location: 753859
Implantable Lead Location: 753860
Implantable Lead Model: 3830
Implantable Lead Model: 5076
Implantable Pulse Generator Implant Date: 20211124
Lead Channel Impedance Value: 228 Ohm
Lead Channel Impedance Value: 285 Ohm
Lead Channel Impedance Value: 342 Ohm
Lead Channel Impedance Value: 418 Ohm
Lead Channel Impedance Value: 418 Ohm
Lead Channel Impedance Value: 456 Ohm
Lead Channel Pacing Threshold Amplitude: 0.875 V
Lead Channel Pacing Threshold Pulse Width: 0.8 ms
Lead Channel Sensing Intrinsic Amplitude: 2.375 mV
Lead Channel Sensing Intrinsic Amplitude: 2.375 mV
Lead Channel Sensing Intrinsic Amplitude: 3.125 mV
Lead Channel Sensing Intrinsic Amplitude: 3.125 mV
Lead Channel Setting Pacing Amplitude: 0.5 V
Lead Channel Setting Pacing Amplitude: 2 V
Lead Channel Setting Pacing Amplitude: 2.5 V
Lead Channel Setting Pacing Pulse Width: 0.03 ms
Lead Channel Setting Pacing Pulse Width: 0.8 ms
Lead Channel Setting Sensing Sensitivity: 0.3 mV

## 2021-03-28 ENCOUNTER — Other Ambulatory Visit: Payer: Self-pay | Admitting: Internal Medicine

## 2021-04-01 NOTE — Progress Notes (Signed)
Remote ICD transmission.   

## 2021-04-07 ENCOUNTER — Other Ambulatory Visit (HOSPITAL_COMMUNITY): Payer: Self-pay | Admitting: Cardiology

## 2021-04-24 ENCOUNTER — Other Ambulatory Visit: Payer: Self-pay | Admitting: Internal Medicine

## 2021-04-25 NOTE — Telephone Encounter (Signed)
Prescription refill request for Eliquis received. Indication: Afib  Last office visit: 10/25/20 Shirlee Latch)  Scr: 0.61 (10/25/20)  Age: 46 Weight: 119.9kg  Appropriate dose and refill sent to requested pharmacy.

## 2021-04-26 ENCOUNTER — Other Ambulatory Visit: Payer: Self-pay | Admitting: Internal Medicine

## 2021-05-09 ENCOUNTER — Other Ambulatory Visit (HOSPITAL_COMMUNITY): Payer: Self-pay

## 2021-05-18 ENCOUNTER — Other Ambulatory Visit: Payer: Self-pay

## 2021-05-18 MED ORDER — ENTRESTO 97-103 MG PO TABS: 1.0000 | ORAL_TABLET | Freq: Two times a day (BID) | ORAL | 1 refills | Status: DC

## 2021-06-21 ENCOUNTER — Ambulatory Visit (INDEPENDENT_AMBULATORY_CARE_PROVIDER_SITE_OTHER): Payer: 59

## 2021-06-21 DIAGNOSIS — I5022 Chronic systolic (congestive) heart failure: Secondary | ICD-10-CM | POA: Diagnosis not present

## 2021-06-21 LAB — CUP PACEART REMOTE DEVICE CHECK
Battery Remaining Longevity: 63 mo
Battery Voltage: 2.98 V
Brady Statistic AP VP Percent: 99.62 %
Brady Statistic AP VS Percent: 0.34 %
Brady Statistic AS VP Percent: 0.05 %
Brady Statistic AS VS Percent: 0 %
Brady Statistic RA Percent Paced: 99.93 %
Brady Statistic RV Percent Paced: 98.39 %
Date Time Interrogation Session: 20230328063329
HighPow Impedance: 65 Ohm
Implantable Lead Implant Date: 20160628
Implantable Lead Implant Date: 20160628
Implantable Lead Implant Date: 20160628
Implantable Lead Location: 753858
Implantable Lead Location: 753859
Implantable Lead Location: 753860
Implantable Lead Model: 3830
Implantable Lead Model: 5076
Implantable Pulse Generator Implant Date: 20211124
Lead Channel Impedance Value: 228 Ohm
Lead Channel Impedance Value: 285 Ohm
Lead Channel Impedance Value: 399 Ohm
Lead Channel Impedance Value: 418 Ohm
Lead Channel Impedance Value: 418 Ohm
Lead Channel Impedance Value: 475 Ohm
Lead Channel Pacing Threshold Amplitude: 1 V
Lead Channel Pacing Threshold Pulse Width: 0.8 ms
Lead Channel Sensing Intrinsic Amplitude: 1.75 mV
Lead Channel Sensing Intrinsic Amplitude: 1.75 mV
Lead Channel Sensing Intrinsic Amplitude: 2.375 mV
Lead Channel Sensing Intrinsic Amplitude: 2.375 mV
Lead Channel Setting Pacing Amplitude: 0.5 V
Lead Channel Setting Pacing Amplitude: 2 V
Lead Channel Setting Pacing Amplitude: 2.5 V
Lead Channel Setting Pacing Pulse Width: 0.03 ms
Lead Channel Setting Pacing Pulse Width: 0.8 ms
Lead Channel Setting Sensing Sensitivity: 0.3 mV

## 2021-07-01 NOTE — Progress Notes (Signed)
Remote ICD transmission.   

## 2021-07-06 ENCOUNTER — Other Ambulatory Visit: Payer: Self-pay | Admitting: Cardiology

## 2021-07-23 ENCOUNTER — Other Ambulatory Visit: Payer: Self-pay | Admitting: Internal Medicine

## 2021-08-03 ENCOUNTER — Other Ambulatory Visit: Payer: Self-pay | Admitting: Internal Medicine

## 2021-08-28 ENCOUNTER — Other Ambulatory Visit: Payer: Self-pay | Admitting: Internal Medicine

## 2021-09-03 ENCOUNTER — Other Ambulatory Visit (HOSPITAL_COMMUNITY): Payer: Self-pay | Admitting: Cardiology

## 2021-09-14 ENCOUNTER — Other Ambulatory Visit: Payer: Self-pay | Admitting: Internal Medicine

## 2021-09-14 NOTE — Telephone Encounter (Signed)
Outreach made to Pt.  Scheduled overdue follow up visit.  Sent in dofetilide refill.

## 2021-09-19 ENCOUNTER — Encounter (HOSPITAL_BASED_OUTPATIENT_CLINIC_OR_DEPARTMENT_OTHER): Payer: Self-pay | Admitting: Internal Medicine

## 2021-09-19 ENCOUNTER — Ambulatory Visit (INDEPENDENT_AMBULATORY_CARE_PROVIDER_SITE_OTHER): Payer: 59 | Admitting: Internal Medicine

## 2021-09-19 VITALS — BP 120/76 | HR 73 | Ht 74.0 in | Wt 262.8 lb

## 2021-09-19 DIAGNOSIS — I440 Atrioventricular block, first degree: Secondary | ICD-10-CM

## 2021-09-19 DIAGNOSIS — I428 Other cardiomyopathies: Secondary | ICD-10-CM | POA: Diagnosis not present

## 2021-09-19 DIAGNOSIS — I5022 Chronic systolic (congestive) heart failure: Secondary | ICD-10-CM | POA: Diagnosis not present

## 2021-09-19 DIAGNOSIS — D6869 Other thrombophilia: Secondary | ICD-10-CM

## 2021-09-19 DIAGNOSIS — I4819 Other persistent atrial fibrillation: Secondary | ICD-10-CM

## 2021-09-19 MED ORDER — DOFETILIDE 500 MCG PO CAPS: 500.0000 ug | ORAL_CAPSULE | Freq: Two times a day (BID) | ORAL | 3 refills | Status: DC

## 2021-09-20 LAB — BASIC METABOLIC PANEL
BUN/Creatinine Ratio: 19 (ref 9–20)
BUN: 13 mg/dL (ref 6–24)
CO2: 22 mmol/L (ref 20–29)
Calcium: 9.4 mg/dL (ref 8.7–10.2)
Chloride: 101 mmol/L (ref 96–106)
Creatinine, Ser: 0.69 mg/dL — ABNORMAL LOW (ref 0.76–1.27)
Glucose: 95 mg/dL (ref 70–99)
Potassium: 4.2 mmol/L (ref 3.5–5.2)
Sodium: 139 mmol/L (ref 134–144)
eGFR: 116 mL/min/{1.73_m2} (ref >59)

## 2021-09-20 LAB — CBC
Hematocrit: 42.9 % (ref 37.5–51.0)
Hemoglobin: 14.9 g/dL (ref 13.0–17.7)
MCH: 32.1 pg (ref 26.6–33.0)
MCHC: 34.7 g/dL (ref 31.5–35.7)
MCV: 93 fL (ref 79–97)
Platelets: 197 10*3/uL (ref 150–450)
RBC: 4.64 x10E6/uL (ref 4.14–5.80)
RDW: 13.1 % (ref 11.6–15.4)
WBC: 7.3 10*3/uL (ref 3.4–10.8)

## 2021-09-20 LAB — MAGNESIUM: Magnesium: 2.2 mg/dL (ref 1.6–2.3)

## 2021-10-19 ENCOUNTER — Other Ambulatory Visit (HOSPITAL_COMMUNITY): Payer: Self-pay | Admitting: Cardiology

## 2021-11-07 ENCOUNTER — Other Ambulatory Visit (HOSPITAL_COMMUNITY): Payer: Self-pay | Admitting: Cardiology

## 2021-12-13 ENCOUNTER — Other Ambulatory Visit: Payer: Self-pay | Admitting: Cardiology

## 2021-12-19 ENCOUNTER — Other Ambulatory Visit (HOSPITAL_COMMUNITY): Payer: Self-pay | Admitting: Cardiology

## 2021-12-20 ENCOUNTER — Ambulatory Visit (INDEPENDENT_AMBULATORY_CARE_PROVIDER_SITE_OTHER): Payer: 59

## 2021-12-20 DIAGNOSIS — I495 Sick sinus syndrome: Secondary | ICD-10-CM | POA: Diagnosis not present

## 2021-12-20 LAB — CUP PACEART REMOTE DEVICE CHECK
Battery Remaining Longevity: 57 mo
Battery Voltage: 2.98 V
Brady Statistic AP VP Percent: 99.6 %
Brady Statistic AP VS Percent: 0.32 %
Brady Statistic AS VP Percent: 0.08 %
Brady Statistic AS VS Percent: 0 %
Brady Statistic RA Percent Paced: 99.91 %
Brady Statistic RV Percent Paced: 98.67 %
Date Time Interrogation Session: 20230926001609
HighPow Impedance: 71 Ohm
Implantable Lead Implant Date: 20160628
Implantable Lead Implant Date: 20160628
Implantable Lead Implant Date: 20160628
Implantable Lead Location: 753858
Implantable Lead Location: 753859
Implantable Lead Location: 753860
Implantable Lead Model: 3830
Implantable Lead Model: 5076
Implantable Pulse Generator Implant Date: 20211124
Lead Channel Impedance Value: 228 Ohm
Lead Channel Impedance Value: 285 Ohm
Lead Channel Impedance Value: 399 Ohm
Lead Channel Impedance Value: 418 Ohm
Lead Channel Impedance Value: 418 Ohm
Lead Channel Impedance Value: 456 Ohm
Lead Channel Pacing Threshold Amplitude: 1 V
Lead Channel Pacing Threshold Pulse Width: 0.8 ms
Lead Channel Sensing Intrinsic Amplitude: 2 mV
Lead Channel Sensing Intrinsic Amplitude: 2 mV
Lead Channel Sensing Intrinsic Amplitude: 2.125 mV
Lead Channel Sensing Intrinsic Amplitude: 2.125 mV
Lead Channel Setting Pacing Amplitude: 0.5 V
Lead Channel Setting Pacing Amplitude: 2 V
Lead Channel Setting Pacing Amplitude: 2.5 V
Lead Channel Setting Pacing Pulse Width: 0.03 ms
Lead Channel Setting Pacing Pulse Width: 0.8 ms
Lead Channel Setting Sensing Sensitivity: 0.3 mV

## 2022-01-02 ENCOUNTER — Other Ambulatory Visit: Payer: Self-pay | Admitting: Cardiology

## 2022-01-02 NOTE — Progress Notes (Signed)
Remote ICD transmission.   

## 2022-01-12 ENCOUNTER — Ambulatory Visit (HOSPITAL_COMMUNITY)
Admission: RE | Admit: 2022-01-12 | Discharge: 2022-01-12 | Disposition: A | Discharge status: Home / Self Care | Payer: 59 | Source: Ambulatory Visit | Attending: Cardiology | Admitting: Cardiology

## 2022-01-12 VITALS — BP 130/80 | HR 82 | Wt 263.4 lb

## 2022-01-12 DIAGNOSIS — Z7901 Long term (current) use of anticoagulants: Secondary | ICD-10-CM | POA: Diagnosis not present

## 2022-01-12 DIAGNOSIS — I4719 Other supraventricular tachycardia: Secondary | ICD-10-CM | POA: Diagnosis not present

## 2022-01-12 DIAGNOSIS — E785 Hyperlipidemia, unspecified: Secondary | ICD-10-CM | POA: Insufficient documentation

## 2022-01-12 DIAGNOSIS — Z95810 Presence of automatic (implantable) cardiac defibrillator: Secondary | ICD-10-CM | POA: Diagnosis not present

## 2022-01-12 DIAGNOSIS — I429 Cardiomyopathy, unspecified: Secondary | ICD-10-CM | POA: Insufficient documentation

## 2022-01-12 DIAGNOSIS — I5022 Chronic systolic (congestive) heart failure: Secondary | ICD-10-CM

## 2022-01-12 DIAGNOSIS — F1090 Alcohol use, unspecified, uncomplicated: Secondary | ICD-10-CM | POA: Diagnosis not present

## 2022-01-12 DIAGNOSIS — Z8616 Personal history of COVID-19: Secondary | ICD-10-CM | POA: Diagnosis not present

## 2022-01-12 DIAGNOSIS — Z79899 Other long term (current) drug therapy: Secondary | ICD-10-CM | POA: Diagnosis not present

## 2022-01-12 DIAGNOSIS — I493 Ventricular premature depolarization: Secondary | ICD-10-CM | POA: Diagnosis not present

## 2022-01-12 DIAGNOSIS — I10 Essential (primary) hypertension: Secondary | ICD-10-CM | POA: Insufficient documentation

## 2022-01-12 DIAGNOSIS — R001 Bradycardia, unspecified: Secondary | ICD-10-CM | POA: Insufficient documentation

## 2022-01-12 DIAGNOSIS — I480 Paroxysmal atrial fibrillation: Secondary | ICD-10-CM | POA: Insufficient documentation

## 2022-01-12 DIAGNOSIS — I472 Ventricular tachycardia, unspecified: Secondary | ICD-10-CM | POA: Insufficient documentation

## 2022-01-12 LAB — BASIC METABOLIC PANEL
Anion gap: 12 (ref 5–15)
BUN: 11 mg/dL (ref 6–20)
CO2: 23 mmol/L (ref 22–32)
Calcium: 9.1 mg/dL (ref 8.9–10.3)
Chloride: 106 mmol/L (ref 98–111)
Creatinine, Ser: 0.61 mg/dL (ref 0.61–1.24)
GFR, Estimated: >60 mL/min (ref >60)
Glucose, Bld: 107 mg/dL — ABNORMAL HIGH (ref 70–99)
Potassium: 3.8 mmol/L (ref 3.5–5.1)
Sodium: 141 mmol/L (ref 135–145)

## 2022-01-12 LAB — CBC
HCT: 39.8 % (ref 39.0–52.0)
Hemoglobin: 14.3 g/dL (ref 13.0–17.0)
MCH: 33.1 pg (ref 26.0–34.0)
MCHC: 35.9 g/dL (ref 30.0–36.0)
MCV: 92.1 fL (ref 80.0–100.0)
Platelets: 172 10*3/uL (ref 150–400)
RBC: 4.32 MIL/uL (ref 4.22–5.81)
RDW: 12.9 % (ref 11.5–15.5)
WBC: 5.5 10*3/uL (ref 4.0–10.5)
nRBC: 0 % (ref 0.0–0.2)

## 2022-01-12 LAB — LIPID PANEL
Cholesterol: 107 mg/dL (ref 0–200)
HDL: 35 mg/dL — ABNORMAL LOW (ref >40)
LDL Cholesterol: 54 mg/dL (ref 0–99)
Total CHOL/HDL Ratio: 3.1 RATIO
Triglycerides: 91 mg/dL (ref <150)
VLDL: 18 mg/dL (ref 0–40)

## 2022-01-12 LAB — MAGNESIUM: Magnesium: 2.2 mg/dL (ref 1.7–2.4)

## 2022-01-12 MED ORDER — APIXABAN 5 MG PO TABS: 5.0000 mg | ORAL_TABLET | Freq: Two times a day (BID) | ORAL | 3 refills | Status: DC

## 2022-01-12 MED ORDER — ENTRESTO 97-103 MG PO TABS: 1.0000 | ORAL_TABLET | Freq: Two times a day (BID) | ORAL | 3 refills | Status: DC

## 2022-01-12 MED ORDER — ROSUVASTATIN CALCIUM 40 MG PO TABS: 40.0000 mg | ORAL_TABLET | Freq: Every evening | ORAL | 3 refills | Status: DC

## 2022-01-12 NOTE — Patient Instructions (Signed)
There has been no changes to your medications.  Labs done today, your results will be available in MyChart, we will contact you for abnormal readings.  Your physician has requested that you have an echocardiogram. Echocardiography is a painless test that uses sound waves to create images of your heart. It provides your doctor with information about the size and shape of your heart and how well your heart's chambers and valves are working. This procedure takes approximately one hour. There are no restrictions for this procedure. Please do NOT wear cologne, perfume, aftershave, or lotions (deodorant is allowed). Please arrive 15 minutes prior to your appointment time.  Your physician recommends that you schedule a follow-up appointment in: 4 months ( February 2024)  ** please call the office in December to arrange your follow up appointment **  If you have any questions or concerns before your next appointment please send us a message through mychart or call our office at [phone].    TO LEAVE A MESSAGE FOR THE NURSE SELECT OPTION 2, PLEASE LEAVE A MESSAGE INCLUDING: YOUR NAME DATE OF BIRTH CALL BACK NUMBER REASON FOR CALL**this is important as we prioritize the call backs  YOU WILL RECEIVE A CALL BACK THE SAME DAY AS LONG AS YOU CALL BEFORE 4:00 PM  At the [facility], you and your health needs are our priority. As part of our continuing mission to provide you with exceptional heart care, we have created designated Provider Care Teams. These Care Teams include your primary Cardiologist (physician) and Advanced Practice Providers (APPs- Physician Assistants and Nurse Practitioners) who all work together to provide you with the care you need, when you need it.   You may see any of the following providers on your designated Care Team at your next follow up: Dr Daniel Bensimhon Dr Dalton McLean Dr. Aditya Sabharwal Amy Clegg, NP Brittainy Simmons, PA Jessica  [place],NP Lindsay Finch, PA Alma Diaz, NP Lauren Kemp, PharmD   Please be sure to bring in all your medications bottles to every appointment.    

## 2022-01-12 NOTE — Progress Notes (Signed)
Patient ID: Donald Grant, male   DOB: Sep 09, 1975, 46 y.o.   MRN: [id number] PCP: Dr. Duaine Dredge HF Cardiology: Dr. Shirlee Latch  46 y.o. with history of cardiomyopathy, heavy ETOH use, paroxysmal atrial fibrillation and flutter with bradycardia, and frequent PVCs.  Patient has a long EP history.  He has paroxysmal atrial fibrillation with slow ventricular response.  He had atrial flutter ablation in 2012 and also has atrial tachycardia with focus near the AV node that has not been ablated.  He had another ablation of atypical atrial flutter in 4/17.  He had a holter in 5/15 showing a 3.8 second pause, predominant atrial fibrillation, and 39% PVCs (some of this was likely atrial fibrillation with aberrancy).  He had runs of NSVT.  He is now on dofetilide. He had a Medtronic CRT-D device placed with his bundle lead.  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  Cardiac MRI in 8/15 did not show evidence for ARVC.    He was referred to CHF clinic initially for management of his cardiomyopathy.  Echo in 7/15 suggested EF 30% but difficult to quantify due to PVCs.  Cardiac MRI in 8/15 showed EF 45% with normal RV and a nonspecific RV insertion site delayed enhancement pattern.  He stopped ETOH completely.  Echo in 2/18 showed EF 50-55%.  Echo in 5/20 showed EF 45-50%, normal RV.  Echo in 3/22 showed worsening LV function with EF 35-40%, mild LV dilation, mild LVH, normal RV.  Patient was drinking more.   Echo in 7/22 showed EF 40-45%, mild LV dilation, normal RV, PASP 24 mmHg, normal IVC.   He returns for followup of CHF. He is still drinking some but has cut back compared to the past.  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  No significant exertional dyspnea or chest pain.  No lightheadedness.  He is taking all his meds.  No orthopnea/PND, no palpitations.   Medtronic device interrogation: 99% BiV pacing, no AF, 2  NSVT runs but no sustained VT, stable thoracic impedance.   ECG (personally reviewed): A-BiV sequentially pacing, QTc 462 msec  Labs (5/15): K 3.9, creatinine 0.7 Labs (8/15): creatinine 0.9 Labs (9/15): K 4.3, creatinine 0.83, pBNP 700 Labs (8/17): K 4.2, creatinine 0.71, HCT 42.2 Labs (11/17): hgb 15.4 Labs (12/19): K 4.1, creatinine 0.73 Labs (3/20): LDL 30 Labs (11/21): K 4.4, creatinine 0.87, hgb 14.3 Labs (12/21): LDL 48, HDL 42 Labs (1/22): K 4.4, creatinine 0.74 Labs (3/22): K 4, creatinine 0.6 Labs (5/22): K 3.9, creatinine 0.59 Labs (6/23): K 4.2, creatinine 0.69  PMH: 1. History of  heavy ETOH use.  2. Atrial fibrillation: Paroxysmal.  CHADSVASC = 2, he is on Pradaxa.   3. HTN 4. Atrial flutter: s/p ablation 11/12.  He had ablation of atypical atrial flutter again in 4/17.  5. Atrial tachycardia: Mapped to AV node, not ablated.  6. Ventricular arrhythmias: NSVT, also very frequent PVCs. Some of beats that seem to be PVCs are likely aberrantly conducted atrial fibrillation.  Holter (5/15) with 3.8 second pause, atrial fibrillation predominantly, 39% PVCs (some of this likely is atrial fibrillation with aberrancy), runs of WCT (suspected NSVT).  7. Cardiomyopathy: Echo (7/15) with EF 30%, mild diffuse hypokinesis, mild LV dilation.  Cardiac MRI (8/15) with moderate LV dilation, EF 45%, diffuse hypokinesis, normal RV with no evidence for ARVC, delayed enhancement at the RV insertion sites which  is a nonspecific pattern.  Lexiscan Cardiolite (5/15) with mild scar in the inferior and inferoseptal walls, no ischemia, not gated due to PVCs.  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   - Medtronic CRT-D with His bundle lead.  - Echo (5/20): EF 45-50%, normal RV. - Echo (3/22): EF 35-40%, mild LV dilation/mild LVH, normal RV.  - Echo (7/22): EF 40-45%, mild LV dilation, normal RV, PASP 24 mmHg, normal IVC 8. Gout 9. 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    SH: Musician.  Married with 2 daughters.  No tobacco.  Occasional marijuana.  Never used cocaine.  Heavy ETOH use.   FH: Mother with SCD in her 20s and dilated cardiomyopathy.  Maternal grandmother with SCD in her 59s, great-aunt with SCD, sister with atrial fibrillation and cardiomyopathy.    ROS: All systems reviewed and negative except as per HPI.   Current Outpatient Medications  Medication Sig Dispense Refill   acyclovir (ZOVIRAX) 400 MG tablet Take 400 mg by mouth 2 (two) times daily as needed (outbreak).      allopurinol (ZYLOPRIM) 300 MG tablet Take 300 mg by mouth daily.      ALPRAZolam (XANAX) 0.5 MG tablet Take 0.25 tablets by mouth daily as needed for anxiety.   1   B Complex-C (B-COMPLEX WITH VITAMIN C) tablet Take 1 tablet by mouth at bedtime.     calcium carbonate (TUMS - DOSED IN MG ELEMENTAL CALCIUM) 500 MG chewable tablet Chew 2-3 tablets by mouth daily as needed for indigestion or heartburn.     carvedilol (COREG) 25 MG tablet Take 1 tablet (25 mg total) by mouth 2 (two) times daily. 180 tablet 3   Cholecalciferol (VITAMIN D) 2000 units CAPS Take 4,000 Units by mouth in the morning and at bedtime.      dofetilide (TIKOSYN) 500 MCG capsule Take 1 capsule (500 mcg total) by mouth 2 (two) times daily. 180 capsule 3   FARXIGA 10 MG TABS tablet TAKE 1 TABLET BY MOUTH EVERY DAY 90 tablet 3   fish oil-omega-3 fatty acids 1000 MG capsule Take 2,000 mg by mouth 2 (two) times daily.     magnesium oxide (MAG-OX) 400 MG tablet TAKE 1 TABLET BY MOUTH EVERY DAY 90 tablet 3   Multiple Vitamin (MULITIVITAMIN WITH MINERALS) TABS Take 1 tablet by mouth every evening.     Psyllium (METAMUCIL PO) Take 8.4 g by mouth 3 (three) times a week. Use 2 teaspoons into water once daily     spironolactone (ALDACTONE) 25 MG tablet TAKE 1 TABLET BY MOUTH EVERY DAY 90 tablet 3   apixaban (ELIQUIS) 5 MG TABS tablet Take 1 tablet (5 mg total) by mouth 2 (two) times daily. 180 tablet 3   rosuvastatin  (CRESTOR) 40 MG tablet Take 1 tablet (40 mg total) by mouth every evening. 90 tablet 3   sacubitril-valsartan (ENTRESTO) 97-103 MG Take 1 tablet by mouth 2 (two) times daily. 180 tablet 3   No current facility-administered medications for this encounter.    BP 130/80   Pulse 82   Wt 119.5 kg (263 lb 6.4 oz)   SpO2 99%   BMI 33.82 kg/m  General: NAD Neck: No JVD, no thyromegaly or thyroid nodule.  Lungs: Clear to auscultation bilaterally with normal respiratory effort. CV: Nondisplaced PMI.  Heart regular S1/S2, no S3/S4, no murmur.  No peripheral edema.  No carotid bruit.  Normal pedal pulses.  Abdomen: Soft, nontender, no hepatosplenomegaly, no distention.  Skin: Intact without  lesions or rashes.  Neurologic: Alert and oriented x 3.  Psych: Normal affect. Extremities: No clubbing or cyanosis.  HEENT: Normal.   Assessment/Plan:  1. 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  He has a Medtronic ICD with His bundle lead.  Of note, heavy ETOH likely contributed to cardiomyopathy.  Echo in 3/22 showed EF down to 35-40% in the setting of more heavy ETOH use.  Echo in 7/22 showed some improvement with EF 40-45%, normal RV.  NYHA class I currrently, not volume overloaded.  Weight is up about 6 lbs.  - I encouraged him to keep ETOH at minimum.  - Continue Coreg 25 mg bid.    - Continue Entresto 97/103 bid.  BMET today. - Continue spironolactone 25 mg daily.  - Continue dapagliflozin 10 mg daily.    - Arrange for repeat echo.  2. Atrial fibrillation: Paroxysmal atrial fibrillation with bradycardic ventricular response, now with His bundle pacing.  He is in NSR today.   - He is on Eliquis with no bleeding sequelae. CBC today.  - Continue dofetilide, QTc ok on ECG today.    - Needs to cut back on ETOH as much as possible as heavy ETOH will increase risk of recurrent atrial fibrillation.  3. Atrial flutter: s/p ablation most recently in 4/17.   4. Atrial tachycardia: Prior history, mapped to area near AV node.  5. 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   7. Hyperlipidemia: Check lipids today.   Followup 4 months with echo.    Marca Ancona 01/12/2022

## 2022-02-21 ENCOUNTER — Other Ambulatory Visit (HOSPITAL_COMMUNITY): Payer: Self-pay | Admitting: Cardiology

## 2022-03-14 ENCOUNTER — Ambulatory Visit (HOSPITAL_COMMUNITY): Payer: 59 | Admitting: Nurse Practitioner

## 2022-03-21 ENCOUNTER — Ambulatory Visit (INDEPENDENT_AMBULATORY_CARE_PROVIDER_SITE_OTHER): Payer: 59

## 2022-03-21 DIAGNOSIS — I440 Atrioventricular block, first degree: Secondary | ICD-10-CM

## 2022-03-22 LAB — CUP PACEART REMOTE DEVICE CHECK
Battery Remaining Longevity: 53 mo
Battery Voltage: 2.97 V
Brady Statistic AP VP Percent: 99.74 %
Brady Statistic AP VS Percent: 0.2 %
Brady Statistic AS VP Percent: 0.05 %
Brady Statistic AS VS Percent: 0 %
Brady Statistic RA Percent Paced: 99.91 %
Brady Statistic RV Percent Paced: 98.84 %
Date Time Interrogation Session: 20231226002205
HighPow Impedance: 66 Ohm
Implantable Lead Connection Status: 753985
Implantable Lead Connection Status: 753985
Implantable Lead Connection Status: 753985
Implantable Lead Implant Date: 20160628
Implantable Lead Implant Date: 20160628
Implantable Lead Implant Date: 20160628
Implantable Lead Location: 753858
Implantable Lead Location: 753859
Implantable Lead Location: 753860
Implantable Lead Model: 3830
Implantable Lead Model: 5076
Implantable Pulse Generator Implant Date: 20211124
Lead Channel Impedance Value: 228 Ohm
Lead Channel Impedance Value: 285 Ohm
Lead Channel Impedance Value: 399 Ohm
Lead Channel Impedance Value: 418 Ohm
Lead Channel Impedance Value: 418 Ohm
Lead Channel Impedance Value: 456 Ohm
Lead Channel Pacing Threshold Amplitude: 1 V
Lead Channel Pacing Threshold Pulse Width: 0.8 ms
Lead Channel Sensing Intrinsic Amplitude: 1.75 mV
Lead Channel Sensing Intrinsic Amplitude: 1.75 mV
Lead Channel Sensing Intrinsic Amplitude: 3.625 mV
Lead Channel Sensing Intrinsic Amplitude: 3.625 mV
Lead Channel Setting Pacing Amplitude: 0.5 V
Lead Channel Setting Pacing Amplitude: 2 V
Lead Channel Setting Pacing Amplitude: 2.5 V
Lead Channel Setting Pacing Pulse Width: 0.03 ms
Lead Channel Setting Pacing Pulse Width: 0.8 ms
Lead Channel Setting Sensing Sensitivity: 0.3 mV

## 2022-04-02 ENCOUNTER — Other Ambulatory Visit (HOSPITAL_COMMUNITY): Payer: Self-pay | Admitting: Cardiology

## 2022-04-04 ENCOUNTER — Encounter (HOSPITAL_COMMUNITY): Payer: Self-pay

## 2022-04-04 ENCOUNTER — Ambulatory Visit (HOSPITAL_COMMUNITY): Payer: 59 | Admitting: Nurse Practitioner

## 2022-04-17 NOTE — Progress Notes (Signed)
Remote ICD transmission.   

## 2022-05-04 ENCOUNTER — Encounter (HOSPITAL_COMMUNITY): Payer: Self-pay | Admitting: *Deleted

## 2022-05-18 ENCOUNTER — Other Ambulatory Visit (HOSPITAL_COMMUNITY): Payer: Self-pay | Admitting: Cardiology

## 2022-05-23 ENCOUNTER — Ambulatory Visit (HOSPITAL_COMMUNITY)
Admission: RE | Admit: 2022-05-23 | Discharge: 2022-05-23 | Disposition: A | Discharge status: Home / Self Care | Payer: 59 | Source: Ambulatory Visit | Attending: Family Medicine | Admitting: Family Medicine

## 2022-05-23 ENCOUNTER — Encounter (HOSPITAL_COMMUNITY): Payer: Self-pay | Admitting: Cardiology

## 2022-05-23 ENCOUNTER — Ambulatory Visit (HOSPITAL_BASED_OUTPATIENT_CLINIC_OR_DEPARTMENT_OTHER)
Admission: RE | Admit: 2022-05-23 | Discharge: 2022-05-23 | Disposition: A | Discharge status: Home / Self Care | Payer: 59 | Source: Ambulatory Visit | Attending: Cardiology | Admitting: Cardiology

## 2022-05-23 VITALS — BP 104/74 | HR 88 | Wt 266.0 lb

## 2022-05-23 DIAGNOSIS — I472 Ventricular tachycardia, unspecified: Secondary | ICD-10-CM | POA: Insufficient documentation

## 2022-05-23 DIAGNOSIS — Z95810 Presence of automatic (implantable) cardiac defibrillator: Secondary | ICD-10-CM | POA: Insufficient documentation

## 2022-05-23 DIAGNOSIS — I4719 Other supraventricular tachycardia: Secondary | ICD-10-CM | POA: Diagnosis not present

## 2022-05-23 DIAGNOSIS — I484 Atypical atrial flutter: Secondary | ICD-10-CM | POA: Diagnosis not present

## 2022-05-23 DIAGNOSIS — Z7182 Exercise counseling: Secondary | ICD-10-CM | POA: Insufficient documentation

## 2022-05-23 DIAGNOSIS — Z7901 Long term (current) use of anticoagulants: Secondary | ICD-10-CM | POA: Insufficient documentation

## 2022-05-23 DIAGNOSIS — I493 Ventricular premature depolarization: Secondary | ICD-10-CM | POA: Insufficient documentation

## 2022-05-23 DIAGNOSIS — I5022 Chronic systolic (congestive) heart failure: Secondary | ICD-10-CM

## 2022-05-23 DIAGNOSIS — I480 Paroxysmal atrial fibrillation: Secondary | ICD-10-CM | POA: Insufficient documentation

## 2022-05-23 DIAGNOSIS — E785 Hyperlipidemia, unspecified: Secondary | ICD-10-CM | POA: Insufficient documentation

## 2022-05-23 DIAGNOSIS — I429 Cardiomyopathy, unspecified: Secondary | ICD-10-CM | POA: Diagnosis present

## 2022-05-23 DIAGNOSIS — Z79899 Other long term (current) drug therapy: Secondary | ICD-10-CM | POA: Insufficient documentation

## 2022-05-23 LAB — BASIC METABOLIC PANEL
Anion gap: 7 (ref 5–15)
BUN: 8 mg/dL (ref 6–20)
CO2: 27 mmol/L (ref 22–32)
Calcium: 8.8 mg/dL — ABNORMAL LOW (ref 8.9–10.3)
Chloride: 101 mmol/L (ref 98–111)
Creatinine, Ser: 0.68 mg/dL (ref 0.61–1.24)
GFR, Estimated: >60 mL/min (ref >60)
Glucose, Bld: 104 mg/dL — ABNORMAL HIGH (ref 70–99)
Potassium: 3.6 mmol/L (ref 3.5–5.1)
Sodium: 135 mmol/L (ref 135–145)

## 2022-05-23 LAB — CBC
HCT: 39.8 % (ref 39.0–52.0)
Hemoglobin: 13.8 g/dL (ref 13.0–17.0)
MCH: 31.9 pg (ref 26.0–34.0)
MCHC: 34.7 g/dL (ref 30.0–36.0)
MCV: 92.1 fL (ref 80.0–100.0)
Platelets: 190 10*3/uL (ref 150–400)
RBC: 4.32 MIL/uL (ref 4.22–5.81)
RDW: 13.2 % (ref 11.5–15.5)
WBC: 6.6 10*3/uL (ref 4.0–10.5)
nRBC: 0 % (ref 0.0–0.2)

## 2022-05-23 LAB — ECHOCARDIOGRAM COMPLETE
Calc EF: 50.7 %
Est EF: 45
S' Lateral: 5 cm
Single Plane A2C EF: 52.3 %
Single Plane A4C EF: 47 %

## 2022-05-23 LAB — MAGNESIUM: Magnesium: 2.4 mg/dL (ref 1.7–2.4)

## 2022-05-23 NOTE — Progress Notes (Signed)
Echocardiogram 2D Echocardiogram has been performed.  Fidel Levy 05/23/2022, 11:47 AM

## 2022-05-23 NOTE — Patient Instructions (Signed)
Good to see you today!   No medication changes  Labs done today, your results will be available in MyChart, we will contact you for abnormal readings.  Your physician recommends that you schedule a follow-up appointment in: 6 months(August) Call office in June to schedule an appointment  If you have any questions or concerns before your next appointment please send [place] a message through [place] or call our office at [phone].    TO LEAVE A MESSAGE FOR THE NURSE SELECT OPTION 2, PLEASE LEAVE A MESSAGE INCLUDING: YOUR NAME DATE OF BIRTH CALL BACK NUMBER REASON FOR CALL**this is important as we prioritize the call backs  YOU WILL RECEIVE A CALL BACK THE SAME DAY AS LONG AS YOU CALL BEFORE 4:00 PM  At the [facility], you and your health needs are our priority. As part of our continuing mission to provide you with exceptional heart care, we have created designated Provider Care Teams. These Care Teams include your primary Cardiologist (physician) and Advanced Practice Providers (APPs- Physician Assistants and Nurse Practitioners) who all work together to provide you with the care you need, when you need it.   You may see any of the following providers on your designated Care Team at your next follow up: Dr Glori Bickers Dr Loralie Champagne Dr. Roxana Hires, NP Lyda Jester, [place] [facility] [place], [place] Forestine Na, NP Audry Riles, PharmD   Please be sure to bring in all your medications bottles to every appointment.    Thank you for choosing [facility]

## 2022-05-23 NOTE — Progress Notes (Signed)
Patient ID: Donald Grant, male   DOB: 08-14-1975, 47 y.o.   MRN: [id number] PCP: Dr. Sandi Mariscal HF Cardiology: Dr. Aundra Dubin  47 y.o. with history of cardiomyopathy, heavy ETOH use, paroxysmal atrial fibrillation and flutter with bradycardia, and frequent PVCs.  Patient has a long EP history.  He has paroxysmal atrial fibrillation with slow ventricular response.  He had atrial flutter ablation in 2012 and also has atrial tachycardia with focus near the AV node that has not been ablated.  He had another ablation of atypical atrial flutter in 4/17.  He had a holter in 5/15 showing a 3.8 second pause, predominant atrial fibrillation, and 39% PVCs (some of this was likely atrial fibrillation with aberrancy).  He had runs of NSVT.  He is now on dofetilide. He had a Medtronic CRT-D device placed with his bundle lead.  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  Cardiac MRI in 8/15 did not show evidence for ARVC.    He was referred to CHF clinic initially for management of his cardiomyopathy.  Echo in 7/15 suggested EF 30% but difficult to quantify due to PVCs.  Cardiac MRI in 8/15 showed EF 45% with normal RV and a nonspecific RV insertion site delayed enhancement pattern.  He stopped ETOH completely.  Echo in 2/18 showed EF 50-55%.  Echo in 5/20 showed EF 45-50%, normal RV.  Echo in 3/22 showed worsening LV function with EF 35-40%, mild LV dilation, mild LVH, normal RV.  Patient was drinking more.   Echo in 7/22 showed EF 40-45%, mild LV dilation, normal RV, PASP 24 mmHg, normal IVC.    Echo was done today and reviewed, EF 45%, global hypokinesis, normal RV.   He returns for followup of CHF. He is still drinking ETOH regularly but has cut back some.  He has had some congestion recently that he attributes to URI.  No significant exertional dyspnea.  Rare atypical chest pain. No lightheadedness.  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    Medtronic device interrogation: 98% his bundle pacing, rare short AF, no VT, stable thoracic impedance.   ECG (personally reviewed): A-BiV sequentially pacing, QTc 493 msec  Labs (5/15): K 3.9, creatinine 0.7 Labs (8/15): creatinine 0.9 Labs (9/15): K 4.3, creatinine 0.83, pBNP 700 Labs (8/17): K 4.2, creatinine 0.71, HCT 42.2 Labs (11/17): hgb 15.4 Labs (12/19): K 4.1, creatinine 0.73 Labs (3/20): LDL 30 Labs (11/21): K 4.4, creatinine 0.87, hgb 14.3 Labs (12/21): LDL 48, HDL 42 Labs (1/22): K 4.4, creatinine 0.74 Labs (3/22): K 4, creatinine 0.6 Labs (5/22): K 3.9, creatinine 0.59 Labs (6/23): K 4.2, creatinine 0.69 Labs (10/23): K 3.8, creatinine 0.61, hgb 14.3, LDL 54  PMH: 1. History of  heavy ETOH use.  2. Atrial fibrillation: Paroxysmal.  CHADSVASC = 2, he is on Pradaxa.   3. HTN 4. Atrial flutter: s/p ablation 11/12.  He had ablation of atypical atrial flutter again in 4/17.  5. Atrial tachycardia: Mapped to AV node, not ablated.  6. Ventricular arrhythmias: NSVT, also very frequent PVCs. Some of beats that seem to be PVCs are likely aberrantly conducted atrial fibrillation.  Holter (5/15) with 3.8 second pause, atrial fibrillation predominantly, 39% PVCs (some of this likely is atrial fibrillation with aberrancy), runs of WCT (suspected NSVT).  7. Cardiomyopathy: Echo (7/15) with EF 30%, mild diffuse hypokinesis, mild LV dilation.  Cardiac MRI (8/15) with moderate LV dilation, EF 45%, diffuse hypokinesis, normal RV  with no evidence for ARVC, delayed enhancement at the RV insertion sites which is a nonspecific pattern.  Lexiscan Cardiolite (5/15) with mild scar in the inferior and inferoseptal walls, no ischemia, not gated due to PVCs.  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   - Medtronic CRT-D with His bundle lead.  - Echo (5/20): EF 45-50%, normal RV. - Echo (3/22): EF 35-40%, mild LV dilation/mild LVH, normal RV.  - Echo (7/22): EF  40-45%, mild LV dilation, normal RV, PASP 24 mmHg, normal IVC - Echo (2/24): EF 45%, global hypokinesis, normal RV.  8. Gout 9. 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  No tobacco.  Occasional marijuana.  Never used cocaine.  Heavy ETOH use in the past, has cut back some.   FH: Mother with SCD in her 76s and dilated cardiomyopathy.  Maternal grandmother with SCD in her 46s, great-aunt with SCD, sister with atrial fibrillation and cardiomyopathy.    ROS: All systems reviewed and negative except as per HPI.   Current Outpatient Medications  Medication Sig Dispense Refill   acyclovir (ZOVIRAX) 400 MG tablet Take 400 mg by mouth 2 (two) times daily as needed (outbreak).      allopurinol (ZYLOPRIM) 300 MG tablet Take 300 mg by mouth daily.      ALPRAZolam (XANAX) 0.5 MG tablet Take 0.25 tablets by mouth daily as needed for anxiety.   1   apixaban (ELIQUIS) 5 MG TABS tablet Take 1 tablet (5 mg total) by mouth 2 (two) times daily. 180 tablet 3   B Complex-C (B-COMPLEX WITH VITAMIN C) tablet Take 1 tablet by mouth at bedtime.     calcium carbonate (TUMS - DOSED IN MG ELEMENTAL CALCIUM) 500 MG chewable tablet Chew 2-3 tablets by mouth daily as needed for indigestion or heartburn.     carvedilol (COREG) 25 MG tablet TAKE 1 TABLET BY MOUTH TWICE A DAY 180 tablet 3   Cholecalciferol (VITAMIN D) 2000 units CAPS Take 4,000 Units by mouth in the morning and at bedtime.      dofetilide (TIKOSYN) 500 MCG capsule Take 1 capsule (500 mcg total) by mouth 2 (two) times daily. 180 capsule 3   FARXIGA 10 MG TABS tablet TAKE 1 TABLET BY MOUTH EVERY DAY 90 tablet 3   fish oil-omega-3 fatty acids 1000 MG capsule Take 2,000 mg by mouth 2 (two) times daily.     magnesium oxide (MAG-OX) 400 MG tablet TAKE 1 TABLET BY MOUTH EVERY DAY 90 tablet 3   Multiple Vitamin (MULITIVITAMIN WITH MINERALS) TABS Take 1 tablet by mouth every evening.     Psyllium (METAMUCIL PO) Take 8.4 g by mouth 3  (three) times a week. Use 2 teaspoons into water once as needed     rosuvastatin (CRESTOR) 40 MG tablet Take 1 tablet (40 mg total) by mouth every evening. 90 tablet 3   sacubitril-valsartan (ENTRESTO) 97-103 MG Take 1 tablet by mouth 2 (two) times daily. 180 tablet 3   spironolactone (ALDACTONE) 25 MG tablet TAKE 1 TABLET BY MOUTH EVERY DAY 90 tablet 3   No current facility-administered medications for this encounter.    BP 104/74   Pulse 88   Wt 120.7 kg (266 lb)   SpO2 98%   BMI 34.15 kg/m  General: NAD Neck: No JVD, no thyromegaly or thyroid nodule.  Lungs: Clear to auscultation bilaterally with normal respiratory effort. CV: Nondisplaced PMI.  Heart regular S1/S2, no S3/S4, no  murmur.  No peripheral edema.  No carotid bruit.  Normal pedal pulses.  Abdomen: Soft, nontender, no hepatosplenomegaly, no distention.  Skin: Intact without lesions or rashes.  Neurologic: Alert and oriented x 3.  Psych: Normal affect. Extremities: No clubbing or cyanosis.  HEENT: Normal.   Assessment/Plan:  1. 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  He has a Medtronic ICD with His bundle lead.  Of note, heavy ETOH likely contributed to cardiomyopathy.  Echo in 3/22 showed EF down to 35-40% in the setting of more heavy ETOH use.  Echo in 7/22 showed some improvement with EF 40-45%, normal RV.  Echo today showed stable to improved EF, 45%. NYHA class I currrently, not volume overloaded by exam or Optivol.   - I encouraged him to keep ETOH at minimum.  - Continue Coreg 25 mg bid.    - Continue Entresto 97/103 bid.  BMET today. - Continue spironolactone 25 mg daily.  - Continue dapagliflozin 10 mg daily.    - Increase exercise.  2. Atrial fibrillation: Paroxysmal atrial fibrillation  with bradycardic ventricular response, now with His bundle pacing.  He is in NSR today.   - He is on Eliquis with no bleeding sequelae. CBC today.  - Continue dofetilide, QTc acceptable on ECG today.   - Needs to cut back on ETOH as much as possible as heavy ETOH will increase risk of recurrent atrial fibrillation.  3. Atrial flutter: s/p ablation most recently in 4/17.   4. Atrial tachycardia: Prior history, mapped to area near AV node.  5. 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   7. Hyperlipidemia: Good lipids in 10/23.   Followup 6 months.  Will need repeat BMET in 3 months, can do at PCP's office.   Loralie Champagne 05/23/2022

## 2022-06-20 ENCOUNTER — Ambulatory Visit (INDEPENDENT_AMBULATORY_CARE_PROVIDER_SITE_OTHER): Payer: 59

## 2022-06-20 DIAGNOSIS — I440 Atrioventricular block, first degree: Secondary | ICD-10-CM

## 2022-06-22 LAB — CUP PACEART REMOTE DEVICE CHECK
Battery Remaining Longevity: 50 mo
Battery Voltage: 2.96 V
Brady Statistic AP VP Percent: 99.54 %
Brady Statistic AP VS Percent: 0.26 %
Brady Statistic AS VP Percent: 0.2 %
Brady Statistic AS VS Percent: 0 %
Brady Statistic RA Percent Paced: 99.47 %
Brady Statistic RV Percent Paced: 97.97 %
Date Time Interrogation Session: 20240327044225
HighPow Impedance: 64 Ohm
Implantable Lead Connection Status: 753985
Implantable Lead Connection Status: 753985
Implantable Lead Connection Status: 753985
Implantable Lead Implant Date: 20160628
Implantable Lead Implant Date: 20160628
Implantable Lead Implant Date: 20160628
Implantable Lead Location: 753858
Implantable Lead Location: 753859
Implantable Lead Location: 753860
Implantable Lead Model: 3830
Implantable Lead Model: 5076
Implantable Pulse Generator Implant Date: 20211124
Lead Channel Impedance Value: 228 Ohm
Lead Channel Impedance Value: 285 Ohm
Lead Channel Impedance Value: 361 Ohm
Lead Channel Impedance Value: 418 Ohm
Lead Channel Impedance Value: 456 Ohm
Lead Channel Impedance Value: 456 Ohm
Lead Channel Pacing Threshold Amplitude: 0.875 V
Lead Channel Pacing Threshold Pulse Width: 0.8 ms
Lead Channel Sensing Intrinsic Amplitude: 2 mV
Lead Channel Sensing Intrinsic Amplitude: 2 mV
Lead Channel Sensing Intrinsic Amplitude: 3.25 mV
Lead Channel Sensing Intrinsic Amplitude: 3.25 mV
Lead Channel Setting Pacing Amplitude: 0.5 V
Lead Channel Setting Pacing Amplitude: 2 V
Lead Channel Setting Pacing Amplitude: 2.5 V
Lead Channel Setting Pacing Pulse Width: 0.03 ms
Lead Channel Setting Pacing Pulse Width: 0.8 ms
Lead Channel Setting Sensing Sensitivity: 0.3 mV

## 2022-08-01 NOTE — Progress Notes (Signed)
Remote ICD transmission.   

## 2022-08-24 ENCOUNTER — Ambulatory Visit
Admission: RE | Admit: 2022-08-24 | Discharge: 2022-08-24 | Disposition: A | Discharge status: Home / Self Care | Payer: 59 | Source: Ambulatory Visit | Attending: Family Medicine | Admitting: Family Medicine

## 2022-08-24 ENCOUNTER — Other Ambulatory Visit: Payer: Self-pay | Admitting: Family Medicine

## 2022-08-24 DIAGNOSIS — R0989 Other specified symptoms and signs involving the circulatory and respiratory systems: Secondary | ICD-10-CM

## 2022-08-24 DIAGNOSIS — R059 Cough, unspecified: Secondary | ICD-10-CM

## 2022-09-15 ENCOUNTER — Ambulatory Visit: Payer: 59 | Attending: Cardiovascular Disease | Admitting: Cardiovascular Disease

## 2022-09-15 ENCOUNTER — Encounter: Payer: Self-pay | Admitting: Cardiovascular Disease

## 2022-09-15 VITALS — BP 126/78 | HR 81 | Ht 74.0 in | Wt 260.0 lb

## 2022-09-15 DIAGNOSIS — I4819 Other persistent atrial fibrillation: Secondary | ICD-10-CM | POA: Diagnosis not present

## 2022-09-15 NOTE — Progress Notes (Signed)
Electrophysiology Office Note:    Date:  09/15/2022   ID:  Donald, Grant 03-18-1976, MRN [id number]  PCP:  Mosetta Putt, MD   [facility] Providers Cardiologist:  None Cardiology APP:  Willaim Sheng, NP  Electrophysiologist:  [name], MD (Inactive)     Referring MD: Mosetta Putt, MD   History of Present Illness:    Donald Grant is a 47 y.o. male with a medical history significant for paroxysmal atrial fibrillation and flutter, bradycardia, frequent PVCs, who presents for EP follow-ip.     I reviewed noted from Drs Johney Frame and Shirlee Latch.  He has a complicated EP history.   He has had visual atrial fibrillation with slow ventricular response.  He had on an atrial flutter ablation in 2012 with a repeat ablation in April 2017.  He has an atrial tachycardia that mapped to the vicinity of the AV node that was not ablated.  Holter in 2015 showed 3.8-second pause, predominant atrial fibrillation.  It also showed a 39% burden of PVCs though much of this is likely aberrant conduction of A-fib.  He has a Medtronic CRT-D device implanted at [facility]  The LV lead is positioned at the HIS, and the RV lead is programmed subthreshold.  There is a strong family history of sudden cardiac death associated with LM and a mutation.  He has had a cardiac MRI in 2015 that did not show any evidence of RV dysfunction.  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  After cessation of alcohol, his EF improved to 50%.  His EF declined back to 35 to 40% in 2022 after he resumed drinking    Today, he reports that he is doing very well.  He has not had any symptoms to suggest recurrence of atrial fibrillation or VT. he has no device related complaints -- no new tenderness, drainage, redness.   EKGs/Labs/Other Studies Reviewed Today:    Echocardiogram:  05/23/2022 TTE EF 45% normal RV structure and function   Monitors:   Stress testing:   Advanced  imaging:   Cardiac catherization   EKG:   EKG Interpretation  Date/Time:  Friday September 15 2022 11:34:42 EDT Ventricular Rate:  81 PR Interval:  232 QRS Duration: 120 QT Interval:  396 QTC Calculation: 460 R Axis:   -18 Text Interpretation: AV dual-paced rhythm with prolonged AV conduction When compared with ECG of 23-May-2022 11:46, Vent. rate has increased BY   8 BPM Confirmed by York Pellant [phone]) on 09/15/2022 11:58:49 AM     Physical Exam:    VS:  BP 126/78   Pulse 81   Ht 6\' 2"  (1.88 m)   Wt 260 lb (117.9 kg)   SpO2 97%   BMI 33.38 kg/m     Wt Readings from Last 3 Encounters:  09/15/22 260 lb (117.9 kg)  05/23/22 266 lb (120.7 kg)  01/12/22 263 lb 6.4 oz (119.5 kg)     GEN:  Well nourished, well developed in no acute distress CARDIAC: RRR, no murmurs, rubs, gallops RESPIRATORY:  Normal work of breathing MUSCULOSKELETAL: no edema    ASSESSMENT & PLAN:    Complete heart block Medtronic CRT-D device functioning normally.  I reviewed the interrogation in detail today.  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 This is my first time meeting the patient. He has a long and complex EP history.  Signed, Maurice Small, MD  09/15/2022 12:03 PM     [facility]

## 2022-09-15 NOTE — Patient Instructions (Signed)
Medication Instructions:  Your physician recommends that you continue on your current medications as directed. Please refer to the Current Medication list given to you today. *If you need a refill on your cardiac medications before your next appointment, please call your pharmacy*   Follow-Up: At [facility], you and your health needs are our priority.  As part of our continuing mission to provide you with exceptional heart care, we have created designated Provider Care Teams.  These Care Teams include your primary Cardiologist (physician) and Advanced Practice Providers (APPs -  Physician Assistants and Nurse Practitioners) who all work together to provide you with the care you need, when you need it.  We recommend signing up for the patient portal called "MyChart".  Sign up information is provided on this After Visit Summary.  MyChart is used to connect with patients for Virtual Visits (Telemedicine).  Patients are able to view lab/test results, encounter notes, upcoming appointments, etc.  Non-urgent messages can be sent to your provider as well.   To learn more about what you can do with MyChart, go to [email or web address].    Your next appointment:   1 year(s)  Provider:   Augustus Mealor, MD  

## 2022-09-19 ENCOUNTER — Ambulatory Visit (INDEPENDENT_AMBULATORY_CARE_PROVIDER_SITE_OTHER): Payer: 59

## 2022-09-19 DIAGNOSIS — I440 Atrioventricular block, first degree: Secondary | ICD-10-CM | POA: Diagnosis not present

## 2022-09-20 ENCOUNTER — Other Ambulatory Visit: Payer: Self-pay | Admitting: *Deleted

## 2022-09-20 LAB — CUP PACEART REMOTE DEVICE CHECK
Battery Remaining Longevity: 45 mo
Battery Voltage: 2.96 V
Brady Statistic AP VP Percent: 98.92 %
Brady Statistic AP VS Percent: 1.06 %
Brady Statistic AS VP Percent: 0.02 %
Brady Statistic AS VS Percent: 0 %
Brady Statistic RA Percent Paced: 99.97 %
Brady Statistic RV Percent Paced: 97.66 %
Date Time Interrogation Session: 20240625091707
HighPow Impedance: 60 Ohm
Implantable Lead Connection Status: 753985
Implantable Lead Connection Status: 753985
Implantable Lead Connection Status: 753985
Implantable Lead Implant Date: 20160628
Implantable Lead Implant Date: 20160628
Implantable Lead Implant Date: 20160628
Implantable Lead Location: 753858
Implantable Lead Location: 753859
Implantable Lead Location: 753860
Implantable Lead Model: 3830
Implantable Lead Model: 5076
Implantable Pulse Generator Implant Date: 20211124
Lead Channel Impedance Value: 228 Ohm
Lead Channel Impedance Value: 247 Ohm
Lead Channel Impedance Value: 342 Ohm
Lead Channel Impedance Value: 399 Ohm
Lead Channel Impedance Value: 418 Ohm
Lead Channel Impedance Value: 418 Ohm
Lead Channel Pacing Threshold Amplitude: 1 V
Lead Channel Pacing Threshold Pulse Width: 0.8 ms
Lead Channel Sensing Intrinsic Amplitude: 2 mV
Lead Channel Sensing Intrinsic Amplitude: 2 mV
Lead Channel Sensing Intrinsic Amplitude: 3.5 mV
Lead Channel Sensing Intrinsic Amplitude: 3.5 mV
Lead Channel Setting Pacing Amplitude: 0.5 V
Lead Channel Setting Pacing Amplitude: 2 V
Lead Channel Setting Pacing Amplitude: 2.5 V
Lead Channel Setting Pacing Pulse Width: 0.03 ms
Lead Channel Setting Pacing Pulse Width: 0.8 ms
Lead Channel Setting Sensing Sensitivity: 0.3 mV

## 2022-09-20 MED ORDER — DOFETILIDE 500 MCG PO CAPS: 500.0000 ug | ORAL_CAPSULE | Freq: Two times a day (BID) | ORAL | 3 refills | Status: DC

## 2022-09-29 ENCOUNTER — Other Ambulatory Visit (HOSPITAL_COMMUNITY): Payer: Self-pay | Admitting: Cardiology

## 2022-09-29 MED ORDER — ENTRESTO 97-103 MG PO TABS: 1.0000 | ORAL_TABLET | Freq: Two times a day (BID) | ORAL | 3 refills | Status: DC

## 2022-09-29 NOTE — Telephone Encounter (Signed)
Requesr script for delivery

## 2022-10-03 MED ORDER — ENTRESTO 97-103 MG PO TABS: 1.0000 | ORAL_TABLET | Freq: Two times a day (BID) | ORAL | 3 refills | Status: DC

## 2022-10-03 NOTE — Addendum Note (Signed)
Addended by: Pamela Intrieri, Milagros Reap on: 10/03/2022 02:39 PM   Modules accepted: Orders

## 2022-10-03 NOTE — Telephone Encounter (Signed)
Unable to give verbal to PharmD Script faxed to [phone]

## 2022-10-03 NOTE — Telephone Encounter (Signed)
Rx management Pharmacy Needs rx for entresto to fulfil shipment Unable to process e-scribe Phone [phone]

## 2022-10-03 NOTE — Addendum Note (Signed)
Addended by: Tinsleigh Slovacek, Milagros Reap on: 10/03/2022 12:23 PM   Modules accepted: Orders

## 2022-10-13 NOTE — Progress Notes (Signed)
Remote ICD transmission.   

## 2022-11-02 ENCOUNTER — Other Ambulatory Visit (HOSPITAL_COMMUNITY): Payer: Self-pay | Admitting: Cardiology

## 2022-11-02 MED ORDER — DAPAGLIFLOZIN PROPANEDIOL 10 MG PO TABS: 10.0000 mg | ORAL_TABLET | Freq: Every day | ORAL | 3 refills | Status: DC

## 2022-11-02 MED ORDER — APIXABAN 5 MG PO TABS: 5.0000 mg | ORAL_TABLET | Freq: Two times a day (BID) | ORAL | 3 refills | Status: DC

## 2022-11-02 NOTE — Telephone Encounter (Signed)
Patient called to request scripts to [facility] Unable to send electronically Fax # [phone]   -eliquis -farxiga  Meds sent

## 2022-11-03 ENCOUNTER — Other Ambulatory Visit (HOSPITAL_COMMUNITY): Payer: Self-pay | Admitting: Cardiology

## 2022-12-19 ENCOUNTER — Ambulatory Visit (HOSPITAL_COMMUNITY)
Admission: RE | Admit: 2022-12-19 | Discharge: 2022-12-19 | Disposition: A | Discharge status: Home / Self Care | Payer: 59 | Source: Ambulatory Visit | Attending: Cardiology | Admitting: Cardiology

## 2022-12-19 ENCOUNTER — Telehealth (HOSPITAL_COMMUNITY): Payer: Self-pay

## 2022-12-19 ENCOUNTER — Encounter (HOSPITAL_COMMUNITY): Payer: Self-pay | Admitting: Cardiology

## 2022-12-19 ENCOUNTER — Ambulatory Visit: Payer: 59

## 2022-12-19 VITALS — BP 110/70 | HR 74 | Wt 258.8 lb

## 2022-12-19 DIAGNOSIS — R9431 Abnormal electrocardiogram [ECG] [EKG]: Secondary | ICD-10-CM | POA: Diagnosis not present

## 2022-12-19 DIAGNOSIS — I480 Paroxysmal atrial fibrillation: Secondary | ICD-10-CM | POA: Insufficient documentation

## 2022-12-19 DIAGNOSIS — I4892 Unspecified atrial flutter: Secondary | ICD-10-CM | POA: Diagnosis not present

## 2022-12-19 DIAGNOSIS — I493 Ventricular premature depolarization: Secondary | ICD-10-CM | POA: Diagnosis not present

## 2022-12-19 DIAGNOSIS — I5022 Chronic systolic (congestive) heart failure: Secondary | ICD-10-CM

## 2022-12-19 DIAGNOSIS — M109 Gout, unspecified: Secondary | ICD-10-CM | POA: Insufficient documentation

## 2022-12-19 DIAGNOSIS — I110 Hypertensive heart disease with heart failure: Secondary | ICD-10-CM | POA: Insufficient documentation

## 2022-12-19 DIAGNOSIS — R000 Tachycardia, unspecified: Secondary | ICD-10-CM | POA: Diagnosis not present

## 2022-12-19 DIAGNOSIS — I495 Sick sinus syndrome: Secondary | ICD-10-CM | POA: Diagnosis not present

## 2022-12-19 DIAGNOSIS — F1090 Alcohol use, unspecified, uncomplicated: Secondary | ICD-10-CM | POA: Diagnosis not present

## 2022-12-19 DIAGNOSIS — I429 Cardiomyopathy, unspecified: Secondary | ICD-10-CM | POA: Diagnosis not present

## 2022-12-19 DIAGNOSIS — Z8616 Personal history of COVID-19: Secondary | ICD-10-CM | POA: Insufficient documentation

## 2022-12-19 DIAGNOSIS — I440 Atrioventricular block, first degree: Secondary | ICD-10-CM

## 2022-12-19 LAB — CUP PACEART REMOTE DEVICE CHECK
Battery Remaining Longevity: 41 mo
Battery Voltage: 2.96 V
Brady Statistic AP VP Percent: 99.71 %
Brady Statistic AP VS Percent: 0.27 %
Brady Statistic AS VP Percent: 0.02 %
Brady Statistic AS VS Percent: 0 %
Brady Statistic RA Percent Paced: 99.96 %
Brady Statistic RV Percent Paced: 99.2 %
Date Time Interrogation Session: 20240924130606
HighPow Impedance: 76 Ohm
Implantable Lead Connection Status: 753985
Implantable Lead Connection Status: 753985
Implantable Lead Connection Status: 753985
Implantable Lead Implant Date: 20160628
Implantable Lead Implant Date: 20160628
Implantable Lead Implant Date: 20160628
Implantable Lead Location: 753858
Implantable Lead Location: 753859
Implantable Lead Location: 753860
Implantable Lead Model: 3830
Implantable Lead Model: 5076
Implantable Pulse Generator Implant Date: 20211124
Lead Channel Impedance Value: 247 Ohm
Lead Channel Impedance Value: 285 Ohm
Lead Channel Impedance Value: 399 Ohm
Lead Channel Impedance Value: 456 Ohm
Lead Channel Impedance Value: 475 Ohm
Lead Channel Impedance Value: 475 Ohm
Lead Channel Pacing Threshold Amplitude: 0.75 V
Lead Channel Pacing Threshold Pulse Width: 0.8 ms
Lead Channel Sensing Intrinsic Amplitude: 2 mV
Lead Channel Sensing Intrinsic Amplitude: 2 mV
Lead Channel Sensing Intrinsic Amplitude: 2.25 mV
Lead Channel Sensing Intrinsic Amplitude: 2.25 mV
Lead Channel Setting Pacing Amplitude: 0.5 V
Lead Channel Setting Pacing Amplitude: 2 V
Lead Channel Setting Pacing Amplitude: 2.5 V
Lead Channel Setting Pacing Pulse Width: 0.03 ms
Lead Channel Setting Pacing Pulse Width: 0.8 ms
Lead Channel Setting Sensing Sensitivity: 0.3 mV

## 2022-12-19 LAB — CBC
HCT: 44 % (ref 39.0–52.0)
Hemoglobin: 15.2 g/dL (ref 13.0–17.0)
MCH: 32 pg (ref 26.0–34.0)
MCHC: 34.5 g/dL (ref 30.0–36.0)
MCV: 92.6 fL (ref 80.0–100.0)
Platelets: 218 10*3/uL (ref 150–400)
RBC: 4.75 MIL/uL (ref 4.22–5.81)
RDW: 12.6 % (ref 11.5–15.5)
WBC: 9.2 10*3/uL (ref 4.0–10.5)
nRBC: 0 % (ref 0.0–0.2)

## 2022-12-19 LAB — BRAIN NATRIURETIC PEPTIDE: B Natriuretic Peptide: 96.6 pg/mL (ref 0.0–100.0)

## 2022-12-19 NOTE — Patient Instructions (Signed)
There has been no changes to your medications.  Labs done today, your results will be available in MyChart, we will contact you for abnormal readings.  Repeat blood work in 4 months.  Your physician has requested that you have an echocardiogram. Echocardiography is a painless test that uses sound waves to create images of your heart. It provides your doctor with information about the size and shape of your heart and how well your heart's chambers and valves are working. This procedure takes approximately one hour. There are no restrictions for this procedure. Please do NOT wear cologne, perfume, aftershave, or lotions (deodorant is allowed). Please arrive 15 minutes prior to your appointment time.  Your physician recommends that you schedule a follow-up appointment in: 1 year with an echocardiogram ( September 2025) ** PLEASE CALL THE OFFICE IN JUNE 2025 TO ARRANGE YOUR FOLLOW UP APPOINTMENT. **  If you have any questions or concerns before your next appointment please send [place] a message through [place] or call our office at [phone].    TO LEAVE A MESSAGE FOR THE NURSE SELECT OPTION 2, PLEASE LEAVE A MESSAGE INCLUDING: YOUR NAME DATE OF BIRTH CALL BACK NUMBER REASON FOR CALL**this is important as we prioritize the call backs  YOU WILL RECEIVE A CALL BACK THE SAME DAY AS LONG AS YOU CALL BEFORE 4:00 PM  At [facility], you and your health needs are our priority. As part of our continuing mission to provide you with exceptional heart care, we have created designated Provider Care Teams. These Care Teams include your primary Cardiologist (physician) and Advanced Practice Providers (APPs- Physician Assistants and Nurse Practitioners) who all work together to provide you with the care you need, when you need it.   You may see any of the following providers on your designated Care Team at your next follow up: Dr Arvilla Meres Dr Marca Ancona Dr. Marcos Eke, NP Robbie Lis, [place] [facility] [place], [place] Brynda Peon, NP Karle Plumber, PharmD   Please be sure to bring in all your medications bottles to every appointment.    Thank you for choosing [facility]

## 2022-12-19 NOTE — Telephone Encounter (Signed)
Patient called and made aware that his lab work hemolyzed and had to be cancelled. New lab appointment made for tomorrow.

## 2022-12-20 ENCOUNTER — Ambulatory Visit (HOSPITAL_COMMUNITY)
Admission: RE | Admit: 2022-12-20 | Discharge: 2022-12-20 | Disposition: A | Discharge status: Home / Self Care | Payer: 59 | Source: Ambulatory Visit | Attending: Cardiology | Admitting: Cardiology

## 2022-12-20 DIAGNOSIS — I5022 Chronic systolic (congestive) heart failure: Secondary | ICD-10-CM | POA: Diagnosis present

## 2022-12-20 LAB — BASIC METABOLIC PANEL
Anion gap: 11 (ref 5–15)
BUN: 13 mg/dL (ref 6–20)
CO2: 21 mmol/L — ABNORMAL LOW (ref 22–32)
Calcium: 8.8 mg/dL — ABNORMAL LOW (ref 8.9–10.3)
Chloride: 102 mmol/L (ref 98–111)
Creatinine, Ser: 0.73 mg/dL (ref 0.61–1.24)
GFR, Estimated: >60 mL/min (ref >60)
Glucose, Bld: 99 mg/dL (ref 70–99)
Potassium: 4 mmol/L (ref 3.5–5.1)
Sodium: 134 mmol/L — ABNORMAL LOW (ref 135–145)

## 2022-12-20 LAB — LIPID PANEL
Cholesterol: 100 mg/dL (ref 0–200)
HDL: 33 mg/dL — ABNORMAL LOW (ref >40)
LDL Cholesterol: 48 mg/dL (ref 0–99)
Total CHOL/HDL Ratio: 3 RATIO
Triglycerides: 96 mg/dL (ref <150)
VLDL: 19 mg/dL (ref 0–40)

## 2022-12-20 LAB — MAGNESIUM: Magnesium: 2.2 mg/dL (ref 1.7–2.4)

## 2022-12-20 NOTE — Progress Notes (Signed)
Patient ID: Donald Grant, male   DOB: November 15, 1975, 47 y.o.   MRN: [id number] PCP: Dr. Duaine Dredge HF Cardiology: Dr. Shirlee Latch  47 y.o. with history of cardiomyopathy, heavy ETOH use, paroxysmal atrial fibrillation and flutter with bradycardia, and frequent PVCs.  Patient has a long EP history.  He has paroxysmal atrial fibrillation with slow ventricular response.  He had atrial flutter ablation in 2012 and also has atrial tachycardia with focus near the AV node that has not been ablated.  He had another ablation of atypical atrial flutter in 4/17.  He had a holter in 5/15 showing a 3.8 second pause, predominant atrial fibrillation, and 39% PVCs (some of this was likely atrial fibrillation with aberrancy).  He had runs of NSVT.  He is now on dofetilide. He had a Medtronic CRT-D device placed with his bundle lead.  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  Cardiac MRI in 8/15 did not show evidence for ARVC.    He was referred to CHF clinic initially for management of his cardiomyopathy.  Echo in 7/15 suggested EF 30% but difficult to quantify due to PVCs.  Cardiac MRI in 8/15 showed EF 45% with normal RV and a nonspecific RV insertion site delayed enhancement pattern.  He stopped ETOH completely.  Echo in 2/18 showed EF 50-55%.  Echo in 5/20 showed EF 45-50%, normal RV.  Echo in 3/22 showed worsening LV function with EF 35-40%, mild LV dilation, mild LVH, normal RV.  Patient was drinking more.   Echo in 7/22 showed EF 40-45%, mild LV dilation, normal RV, PASP 24 mmHg, normal IVC.    Echo in 1/24 showed EF 45%, global hypokinesis, normal RV.   He returns for followup of CHF. He is still drinking ETOH regularly but has cut back and drinks at most moderately.  Trying to exercise more, walks 2-3 miles/day.  Weight down 8 lbs.  No exertional dyspnea or chest pain.  No orthopnea/PND.  Tolerating all medications, no lightheadedness.  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  No BRBPR/melena. Under a lot of stress.   Medtronic device interrogation: 99% his bundle pacing, rare short AF bursts, no VT, stable thoracic impedance.   ECG (personally reviewed): A-BiV sequentially pacing, QTc 469 msec  Labs (5/15): K 3.9, creatinine 0.7 Labs (8/15): creatinine 0.9 Labs (9/15): K 4.3, creatinine 0.83, pBNP 700 Labs (8/17): K 4.2, creatinine 0.71, HCT 42.2 Labs (11/17): hgb 15.4 Labs (12/19): K 4.1, creatinine 0.73 Labs (3/20): LDL 30 Labs (11/21): K 4.4, creatinine 0.87, hgb 14.3 Labs (12/21): LDL 48, HDL 42 Labs (1/22): K 4.4, creatinine 0.74 Labs (3/22): K 4, creatinine 0.6 Labs (5/22): K 3.9, creatinine 0.59 Labs (6/23): K 4.2, creatinine 0.69 Labs (10/23): K 3.8, creatinine 0.61, hgb 14.3, LDL 54 Labs (2/24): K 3.6, creatinine 0.68, hgb 13.8  PMH: 1. History of  heavy ETOH use.  2. Atrial fibrillation: Paroxysmal.  CHADSVASC = 2, he is on Pradaxa.   3. HTN 4. Atrial flutter: s/p ablation 11/12.  He had ablation of atypical atrial flutter again in 4/17.  5. Atrial tachycardia: Mapped to AV node, not ablated.  6. Ventricular arrhythmias: NSVT, also very frequent PVCs. Some of beats that seem to be PVCs are likely aberrantly conducted atrial fibrillation.  Holter (5/15) with 3.8 second pause, atrial fibrillation predominantly, 39% PVCs (some of this likely is atrial fibrillation with aberrancy), runs of WCT (suspected NSVT).  7. Cardiomyopathy: Echo (  7/15) with EF 30%, mild diffuse hypokinesis, mild LV dilation.  Cardiac MRI (8/15) with moderate LV dilation, EF 45%, diffuse hypokinesis, normal RV with no evidence for ARVC, delayed enhancement at the RV insertion sites which is a nonspecific pattern.  Lexiscan Cardiolite (5/15) with mild scar in the inferior and inferoseptal walls, no ischemia, not gated due to PVCs.  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   - Medtronic CRT-D with His bundle  lead.  - Echo (5/20): EF 45-50%, normal RV. - Echo (3/22): EF 35-40%, mild LV dilation/mild LVH, normal RV.  - Echo (7/22): EF 40-45%, mild LV dilation, normal RV, PASP 24 mmHg, normal IVC - Echo (2/24): EF 45%, global hypokinesis, normal RV.  8. Gout 9. 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  No tobacco.  Occasional marijuana.  Never used cocaine.  Heavy ETOH use in the past, has cut back some.   FH: Mother with SCD in her 58s and dilated cardiomyopathy.  Maternal grandmother with SCD in her 26s, great-aunt with SCD, sister with atrial fibrillation and cardiomyopathy.    ROS: All systems reviewed and negative except as per HPI.   Current Outpatient Medications  Medication Sig Dispense Refill   acyclovir (ZOVIRAX) 400 MG tablet Take 400 mg by mouth 2 (two) times daily as needed (outbreak).      allopurinol (ZYLOPRIM) 300 MG tablet Take 300 mg by mouth daily.      ALPRAZolam (XANAX) 0.5 MG tablet Take 0.25 tablets by mouth daily as needed for anxiety.   1   apixaban (ELIQUIS) 5 MG TABS tablet Take 1 tablet (5 mg total) by mouth 2 (two) times daily. 180 tablet 3   B Complex-C (B-COMPLEX WITH VITAMIN C) tablet Take 1 tablet by mouth at bedtime.     calcium carbonate (TUMS - DOSED IN MG ELEMENTAL CALCIUM) 500 MG chewable tablet Chew 2-3 tablets by mouth daily as needed for indigestion or heartburn.     carvedilol (COREG) 25 MG tablet TAKE 1 TABLET BY MOUTH TWICE A DAY 180 tablet 1   Cholecalciferol (VITAMIN D) 2000 units CAPS Take 4,000 Units by mouth in the morning and at bedtime.      dapagliflozin propanediol (FARXIGA) 10 MG TABS tablet Take 1 tablet (10 mg total) by mouth daily. 90 tablet 3   dofetilide (TIKOSYN) 500 MCG capsule Take 1 capsule (500 mcg total) by mouth 2 (two) times daily. 180 capsule 3   fish oil-omega-3 fatty acids 1000 MG capsule Take 2,000 mg by mouth 2 (two) times daily.     magnesium oxide (MAG-OX) 400 MG tablet TAKE 1 TABLET BY MOUTH  EVERY DAY 90 tablet 3   Multiple Vitamin (MULITIVITAMIN WITH MINERALS) TABS Take 1 tablet by mouth every evening.     Psyllium (METAMUCIL PO) Take 8.4 g by mouth 3 (three) times a week. Use 2 teaspoons into water once as needed     rosuvastatin (CRESTOR) 40 MG tablet Take 1 tablet (40 mg total) by mouth every evening. 90 tablet 3   sacubitril-valsartan (ENTRESTO) 97-103 MG Take 1 tablet by mouth 2 (two) times daily. 180 tablet 3   spironolactone (ALDACTONE) 25 MG tablet TAKE 1 TABLET BY MOUTH EVERY DAY 90 tablet 3   No current facility-administered medications for this encounter.    BP 110/70   Pulse 74   Wt 117.4 kg (258 lb 12.8 oz)   SpO2 95%   BMI 33.23 kg/m  General: NAD Neck: No JVD, no thyromegaly or thyroid nodule.  Lungs: Clear to auscultation bilaterally with normal respiratory effort. CV: Nondisplaced PMI.  Heart regular S1/S2, no S3/S4, no murmur.  No peripheral edema.  No carotid bruit.  Normal pedal pulses.  Abdomen: Soft, nontender, no hepatosplenomegaly, no distention.  Skin: Intact without lesions or rashes.  Neurologic: Alert and oriented x 3.  Psych: Normal affect. Extremities: No clubbing or cyanosis.  HEENT: Normal.   Assessment/Plan:  1. 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  He has a Medtronic ICD with His bundle lead.  Of note, heavy ETOH likely contributed to cardiomyopathy.  Echo in 3/22 showed EF down to 35-40% in the setting of more heavy ETOH use.  Echo in 7/22 showed some improvement with EF 40-45%, normal RV.  Echo in 1/24 showed stable to improved EF, 45%. NYHA class I currrently, not volume overloaded by exam or Optivol.    - I encouraged him to keep ETOH at minimum.  - Continue Coreg 25 mg bid.    - Continue Entresto 97/103  bid.  BMET today. - Continue spironolactone 25 mg daily.  - Continue dapagliflozin 10 mg daily.   - Repeat echo in 1/25.  2. Atrial fibrillation: Paroxysmal atrial fibrillation with bradycardic ventricular response, now with His bundle pacing.  He is a-paced today.   - He is on Eliquis with no bleeding sequelae. CBC today.  - Continue dofetilide, QTc acceptable on ECG today.   - Needs to cut back on ETOH as much as possible as heavy ETOH will increase risk of recurrent atrial fibrillation.  3. Atrial flutter: s/p ablation most recently in 4/17.   4. Atrial tachycardia: Prior history, mapped to area near AV node.  5. 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   7. Hyperlipidemia: Check lipids today.   Will need repeat BMET in 3 months, can do at PCP's office.   Marca Ancona 12/20/2022

## 2023-01-05 NOTE — Progress Notes (Signed)
Remote ICD transmission.   

## 2023-01-09 ENCOUNTER — Ambulatory Visit
Admission: RE | Admit: 2023-01-09 | Discharge: 2023-01-09 | Disposition: A | Discharge status: Home / Self Care | Payer: 59 | Source: Ambulatory Visit | Attending: Family Medicine | Admitting: Family Medicine

## 2023-01-09 ENCOUNTER — Other Ambulatory Visit: Payer: Self-pay | Admitting: Family Medicine

## 2023-01-09 DIAGNOSIS — M545 Low back pain, unspecified: Secondary | ICD-10-CM

## 2023-03-20 ENCOUNTER — Ambulatory Visit (INDEPENDENT_AMBULATORY_CARE_PROVIDER_SITE_OTHER): Payer: 59

## 2023-03-20 DIAGNOSIS — I5022 Chronic systolic (congestive) heart failure: Secondary | ICD-10-CM | POA: Diagnosis not present

## 2023-03-22 LAB — CUP PACEART REMOTE DEVICE CHECK
Battery Remaining Longevity: 36 mo
Battery Voltage: 2.96 V
Brady Statistic AP VP Percent: 99.79 %
Brady Statistic AP VS Percent: 0.08 %
Brady Statistic AS VP Percent: 0.14 %
Brady Statistic AS VS Percent: 0 %
Brady Statistic RA Percent Paced: 99.83 %
Brady Statistic RV Percent Paced: 99.52 %
Date Time Interrogation Session: 20241224031703
HighPow Impedance: 71 Ohm
Implantable Lead Connection Status: 753985
Implantable Lead Connection Status: 753985
Implantable Lead Connection Status: 753985
Implantable Lead Implant Date: 20160628
Implantable Lead Implant Date: 20160628
Implantable Lead Implant Date: 20160628
Implantable Lead Location: 753858
Implantable Lead Location: 753859
Implantable Lead Location: 753860
Implantable Lead Model: 3830
Implantable Lead Model: 5076
Implantable Pulse Generator Implant Date: 20211124
Lead Channel Impedance Value: 247 Ohm
Lead Channel Impedance Value: 285 Ohm
Lead Channel Impedance Value: 342 Ohm
Lead Channel Impedance Value: 399 Ohm
Lead Channel Impedance Value: 456 Ohm
Lead Channel Impedance Value: 456 Ohm
Lead Channel Pacing Threshold Amplitude: 0.875 V
Lead Channel Pacing Threshold Pulse Width: 0.8 ms
Lead Channel Sensing Intrinsic Amplitude: 1.5 mV
Lead Channel Sensing Intrinsic Amplitude: 1.5 mV
Lead Channel Sensing Intrinsic Amplitude: 2.25 mV
Lead Channel Sensing Intrinsic Amplitude: 2.25 mV
Lead Channel Setting Pacing Amplitude: 0.5 V
Lead Channel Setting Pacing Amplitude: 2 V
Lead Channel Setting Pacing Amplitude: 2.5 V
Lead Channel Setting Pacing Pulse Width: 0.03 ms
Lead Channel Setting Pacing Pulse Width: 0.8 ms
Lead Channel Setting Sensing Sensitivity: 0.3 mV

## 2023-04-20 ENCOUNTER — Ambulatory Visit (HOSPITAL_COMMUNITY)
Admission: RE | Admit: 2023-04-20 | Discharge: 2023-04-20 | Disposition: A | Discharge status: Home / Self Care | Payer: 59 | Source: Ambulatory Visit | Attending: Cardiology | Admitting: Cardiology

## 2023-04-20 ENCOUNTER — Other Ambulatory Visit (HOSPITAL_COMMUNITY): Payer: Self-pay

## 2023-04-20 DIAGNOSIS — I5022 Chronic systolic (congestive) heart failure: Secondary | ICD-10-CM | POA: Insufficient documentation

## 2023-04-20 LAB — BASIC METABOLIC PANEL
Anion gap: 8 (ref 5–15)
BUN: 10 mg/dL (ref 6–20)
CO2: 25 mmol/L (ref 22–32)
Calcium: 9 mg/dL (ref 8.9–10.3)
Chloride: 103 mmol/L (ref 98–111)
Creatinine, Ser: 0.65 mg/dL (ref 0.61–1.24)
GFR, Estimated: >60 mL/min (ref >60)
Glucose, Bld: 115 mg/dL — ABNORMAL HIGH (ref 70–99)
Potassium: 4 mmol/L (ref 3.5–5.1)
Sodium: 136 mmol/L (ref 135–145)

## 2023-06-19 ENCOUNTER — Ambulatory Visit (INDEPENDENT_AMBULATORY_CARE_PROVIDER_SITE_OTHER): Payer: 59

## 2023-06-19 DIAGNOSIS — I495 Sick sinus syndrome: Secondary | ICD-10-CM | POA: Diagnosis not present

## 2023-06-20 LAB — CUP PACEART REMOTE DEVICE CHECK
Battery Remaining Longevity: 32 mo
Battery Voltage: 2.95 V
Brady Statistic AP VP Percent: 99.26 %
Brady Statistic AP VS Percent: 0.24 %
Brady Statistic AS VP Percent: 0.5 %
Brady Statistic AS VS Percent: 0.01 %
Brady Statistic RA Percent Paced: 99.49 %
Brady Statistic RV Percent Paced: 98.97 %
Date Time Interrogation Session: 20250325043723
HighPow Impedance: 71 Ohm
Implantable Lead Connection Status: 753985
Implantable Lead Connection Status: 753985
Implantable Lead Connection Status: 753985
Implantable Lead Implant Date: 20160628
Implantable Lead Implant Date: 20160628
Implantable Lead Implant Date: 20160628
Implantable Lead Location: 753858
Implantable Lead Location: 753859
Implantable Lead Location: 753860
Implantable Lead Model: 3830
Implantable Lead Model: 5076
Implantable Pulse Generator Implant Date: 20211124
Lead Channel Impedance Value: 228 Ohm
Lead Channel Impedance Value: 285 Ohm
Lead Channel Impedance Value: 361 Ohm
Lead Channel Impedance Value: 399 Ohm
Lead Channel Impedance Value: 418 Ohm
Lead Channel Impedance Value: 418 Ohm
Lead Channel Pacing Threshold Amplitude: 0.875 V
Lead Channel Pacing Threshold Pulse Width: 0.8 ms
Lead Channel Sensing Intrinsic Amplitude: 1.625 mV
Lead Channel Sensing Intrinsic Amplitude: 1.625 mV
Lead Channel Sensing Intrinsic Amplitude: 2.25 mV
Lead Channel Sensing Intrinsic Amplitude: 2.25 mV
Lead Channel Setting Pacing Amplitude: 0.5 V
Lead Channel Setting Pacing Amplitude: 2 V
Lead Channel Setting Pacing Amplitude: 2.5 V
Lead Channel Setting Pacing Pulse Width: 0.03 ms
Lead Channel Setting Pacing Pulse Width: 0.8 ms
Lead Channel Setting Sensing Sensitivity: 0.3 mV

## 2023-07-05 ENCOUNTER — Encounter: Payer: Self-pay | Admitting: Cardiovascular Disease

## 2023-07-21 ENCOUNTER — Other Ambulatory Visit (HOSPITAL_COMMUNITY): Payer: Self-pay | Admitting: Cardiology

## 2023-07-27 ENCOUNTER — Other Ambulatory Visit: Payer: Self-pay | Admitting: Cardiovascular Disease

## 2023-08-03 NOTE — Progress Notes (Signed)
 Remote ICD transmission.

## 2023-08-03 NOTE — Addendum Note (Signed)
 Addended by: Lott Rouleau A on: 08/03/2023 03:07 PM   Modules accepted: Orders

## 2023-08-27 ENCOUNTER — Other Ambulatory Visit (HOSPITAL_COMMUNITY): Payer: Self-pay

## 2023-08-27 MED ORDER — APIXABAN 5 MG PO TABS: 5.0000 mg | ORAL_TABLET | Freq: Two times a day (BID) | ORAL | 3 refills | Status: DC

## 2023-08-27 MED ORDER — DAPAGLIFLOZIN PROPANEDIOL 10 MG PO TABS: 10.0000 mg | ORAL_TABLET | Freq: Every day | ORAL | 3 refills | Status: DC

## 2023-08-27 MED ORDER — ENTRESTO 97-103 MG PO TABS: 1.0000 | ORAL_TABLET | Freq: Two times a day (BID) | ORAL | 3 refills | Status: DC

## 2023-08-31 ENCOUNTER — Telehealth (HOSPITAL_COMMUNITY): Payer: Self-pay

## 2023-08-31 MED ORDER — DAPAGLIFLOZIN PROPANEDIOL 10 MG PO TABS: 10.0000 mg | ORAL_TABLET | Freq: Every day | ORAL | 0 refills | Status: DC

## 2023-08-31 MED ORDER — DAPAGLIFLOZIN PROPANEDIOL 10 MG PO TABS: 10.0000 mg | ORAL_TABLET | Freq: Every day | ORAL | 1 refills | Status: DC

## 2023-08-31 MED ORDER — ENTRESTO 97-103 MG PO TABS: 1.0000 | ORAL_TABLET | Freq: Two times a day (BID) | ORAL | 1 refills | Status: DC

## 2023-08-31 MED ORDER — ENTRESTO 97-103 MG PO TABS: 1.0000 | ORAL_TABLET | Freq: Two times a day (BID) | ORAL | 0 refills | Status: DC

## 2023-08-31 MED ORDER — APIXABAN 5 MG PO TABS: 5.0000 mg | ORAL_TABLET | Freq: Two times a day (BID) | ORAL | 0 refills | Status: DC

## 2023-08-31 MED ORDER — APIXABAN 5 MG PO TABS: 5.0000 mg | ORAL_TABLET | Freq: Two times a day (BID) | ORAL | 1 refills | Status: DC

## 2023-08-31 NOTE — Telephone Encounter (Signed)
 Received call from patient advocate requesting refills be sent to local [facility] on [address] for Eliquis , Entresto , and Farxiga .   - Continue Entresto  97/103 bid.  BMET today. - Continue spironolactone  25 mg daily.  - Continue dapagliflozin  10 mg daily.  He is on Eliquis  with no bleeding sequelae. CBC today.   Also needs prescriptions sent to [facility]   Prescriptions sent over.

## 2023-09-17 MED ORDER — ENTRESTO 97-103 MG PO TABS: 1.0000 | ORAL_TABLET | Freq: Two times a day (BID) | ORAL | 1 refills | Status: DC

## 2023-09-17 MED ORDER — DAPAGLIFLOZIN PROPANEDIOL 10 MG PO TABS: 10.0000 mg | ORAL_TABLET | Freq: Every day | ORAL | 1 refills | Status: DC

## 2023-09-17 MED ORDER — APIXABAN 5 MG PO TABS: 5.0000 mg | ORAL_TABLET | Freq: Two times a day (BID) | ORAL | 1 refills | Status: DC

## 2023-09-17 NOTE — Telephone Encounter (Signed)
 Unable to send electronically Faxed to [facility] pharmacy [phone]

## 2023-09-17 NOTE — Addendum Note (Signed)
 Addended by: JERONA DALTON HERO on: 09/17/2023 03:48 PM   Modules accepted: Orders

## 2023-09-18 ENCOUNTER — Ambulatory Visit (INDEPENDENT_AMBULATORY_CARE_PROVIDER_SITE_OTHER): Payer: 59

## 2023-09-18 DIAGNOSIS — I495 Sick sinus syndrome: Secondary | ICD-10-CM

## 2023-09-18 LAB — CUP PACEART REMOTE DEVICE CHECK
Battery Remaining Longevity: 27 mo
Battery Voltage: 2.95 V
Brady Statistic AP VP Percent: 99.83 %
Brady Statistic AP VS Percent: 0.13 %
Brady Statistic AS VP Percent: 0.04 %
Brady Statistic AS VS Percent: 0 %
Brady Statistic RA Percent Paced: 99.95 %
Brady Statistic RV Percent Paced: 99.57 %
Date Time Interrogation Session: 20250624001705
HighPow Impedance: 68 Ohm
Implantable Lead Connection Status: 753985
Implantable Lead Connection Status: 753985
Implantable Lead Connection Status: 753985
Implantable Lead Implant Date: 20160628
Implantable Lead Implant Date: 20160628
Implantable Lead Implant Date: 20160628
Implantable Lead Location: 753858
Implantable Lead Location: 753859
Implantable Lead Location: 753860
Implantable Lead Model: 3830
Implantable Lead Model: 5076
Implantable Pulse Generator Implant Date: 20211124
Lead Channel Impedance Value: 228 Ohm
Lead Channel Impedance Value: 285 Ohm
Lead Channel Impedance Value: 361 Ohm
Lead Channel Impedance Value: 418 Ohm
Lead Channel Impedance Value: 456 Ohm
Lead Channel Impedance Value: 456 Ohm
Lead Channel Pacing Threshold Amplitude: 0.75 V
Lead Channel Pacing Threshold Pulse Width: 0.8 ms
Lead Channel Sensing Intrinsic Amplitude: 1.625 mV
Lead Channel Sensing Intrinsic Amplitude: 1.625 mV
Lead Channel Sensing Intrinsic Amplitude: 2.25 mV
Lead Channel Sensing Intrinsic Amplitude: 2.25 mV
Lead Channel Setting Pacing Amplitude: 0.5 V
Lead Channel Setting Pacing Amplitude: 2 V
Lead Channel Setting Pacing Amplitude: 2.5 V
Lead Channel Setting Pacing Pulse Width: 0.03 ms
Lead Channel Setting Pacing Pulse Width: 0.8 ms
Lead Channel Setting Sensing Sensitivity: 0.3 mV

## 2023-09-26 ENCOUNTER — Ambulatory Visit: Payer: Self-pay | Admitting: Cardiovascular Disease

## 2023-10-02 ENCOUNTER — Other Ambulatory Visit (HOSPITAL_COMMUNITY): Payer: Self-pay | Admitting: *Deleted

## 2023-10-02 MED ORDER — APIXABAN 5 MG PO TABS: 5.0000 mg | ORAL_TABLET | Freq: Two times a day (BID) | ORAL | 3 refills | Status: AC

## 2023-10-02 MED ORDER — SACUBITRIL-VALSARTAN 97-103 MG PO TABS: 1.0000 | ORAL_TABLET | Freq: Two times a day (BID) | ORAL | 3 refills | Status: DC

## 2023-10-02 MED ORDER — DAPAGLIFLOZIN PROPANEDIOL 10 MG PO TABS: 10.0000 mg | ORAL_TABLET | Freq: Every day | ORAL | 3 refills | Status: DC

## 2023-10-02 NOTE — Telephone Encounter (Signed)
 Pt states he is changing [facility], request new RX for Eliquis , Entresto , and Farxiga  be sent in, rx's sent

## 2023-10-18 ENCOUNTER — Other Ambulatory Visit (HOSPITAL_COMMUNITY): Payer: Self-pay | Admitting: Cardiology

## 2023-10-21 ENCOUNTER — Other Ambulatory Visit: Payer: Self-pay | Admitting: Cardiovascular Disease

## 2023-10-31 ENCOUNTER — Other Ambulatory Visit: Payer: Self-pay | Admitting: Cardiovascular Disease

## 2023-11-12 ENCOUNTER — Other Ambulatory Visit: Payer: Self-pay | Admitting: Cardiovascular Disease

## 2023-12-18 ENCOUNTER — Ambulatory Visit: Payer: 59

## 2023-12-18 DIAGNOSIS — I495 Sick sinus syndrome: Secondary | ICD-10-CM | POA: Diagnosis not present

## 2023-12-19 LAB — CUP PACEART REMOTE DEVICE CHECK
Battery Remaining Longevity: 23 mo
Battery Voltage: 2.94 V
Brady Statistic AP VP Percent: 99.72 %
Brady Statistic AP VS Percent: 0.25 %
Brady Statistic AS VP Percent: 0.02 %
Brady Statistic AS VS Percent: 0 %
Brady Statistic RA Percent Paced: 99.95 %
Brady Statistic RV Percent Paced: 98.93 %
Date Time Interrogation Session: 20250923093427
HighPow Impedance: 72 Ohm
Implantable Lead Connection Status: 753985
Implantable Lead Connection Status: 753985
Implantable Lead Connection Status: 753985
Implantable Lead Implant Date: 20160628
Implantable Lead Implant Date: 20160628
Implantable Lead Implant Date: 20160628
Implantable Lead Location: 753858
Implantable Lead Location: 753859
Implantable Lead Location: 753860
Implantable Lead Model: 3830
Implantable Lead Model: 5076
Implantable Pulse Generator Implant Date: 20211124
Lead Channel Impedance Value: 228 Ohm
Lead Channel Impedance Value: 304 Ohm
Lead Channel Impedance Value: 361 Ohm
Lead Channel Impedance Value: 456 Ohm
Lead Channel Impedance Value: 456 Ohm
Lead Channel Impedance Value: 456 Ohm
Lead Channel Pacing Threshold Amplitude: 0.75 V
Lead Channel Pacing Threshold Pulse Width: 0.8 ms
Lead Channel Sensing Intrinsic Amplitude: 1.625 mV
Lead Channel Sensing Intrinsic Amplitude: 1.625 mV
Lead Channel Sensing Intrinsic Amplitude: 2.25 mV
Lead Channel Sensing Intrinsic Amplitude: 2.25 mV
Lead Channel Setting Pacing Amplitude: 0.5 V
Lead Channel Setting Pacing Amplitude: 2 V
Lead Channel Setting Pacing Amplitude: 2.5 V
Lead Channel Setting Pacing Pulse Width: 0.03 ms
Lead Channel Setting Pacing Pulse Width: 0.8 ms
Lead Channel Setting Sensing Sensitivity: 0.3 mV

## 2023-12-19 NOTE — Progress Notes (Signed)
Remote ICD Transmission.

## 2024-01-01 ENCOUNTER — Ambulatory Visit: Payer: Self-pay | Admitting: Cardiovascular Disease

## 2024-01-18 ENCOUNTER — Other Ambulatory Visit (HOSPITAL_COMMUNITY): Payer: Self-pay | Admitting: Cardiology

## 2024-01-20 ENCOUNTER — Telehealth: Payer: Self-pay | Admitting: Physician Assistant

## 2024-01-20 ENCOUNTER — Other Ambulatory Visit: Payer: Self-pay | Admitting: Cardiovascular Disease

## 2024-01-20 NOTE — Telephone Encounter (Signed)
 Patient needs his Tikosyn .  Please call in ASAP.  This is not a medication to delay as he would have to go back into the hospital to restart.  Please make sure he gets this.  He needs followup in this office but need to call in the Tikosyn  immediately.

## 2024-01-20 NOTE — Telephone Encounter (Signed)
 Patient called to request refill of Tikosyn . He reports he picked up prescription last week for 15 pills of Tikoysn and only has 1 left. Per chart review, Tikosyn  500 mcg BID #15 was last ordered on 11/14/2023 and noted 2nd attempt as well as will need appointment for further refills. We were able to locate a dispense report which showed last prescription 01/07/2024 for 7 day supply. The patient is beyond the 7 day window for this last prescription, which is suggestive of noncompliance. The patient was last seen in office over a year ago in 11/2022. Discusses with patient that a follow up appointment is needed in order to reinitiate Tikosyn . Encouraged the patient to call office in the morning to request follow up appointment with AFIB/EP and heart failure clinic.

## 2024-01-21 ENCOUNTER — Other Ambulatory Visit (HOSPITAL_COMMUNITY): Payer: Self-pay | Admitting: Cardiology

## 2024-01-21 ENCOUNTER — Other Ambulatory Visit (HOSPITAL_COMMUNITY): Payer: Self-pay | Admitting: *Deleted

## 2024-01-21 DIAGNOSIS — I5022 Chronic systolic (congestive) heart failure: Secondary | ICD-10-CM

## 2024-01-21 MED ORDER — DOFETILIDE 500 MCG PO CAPS: 500.0000 ug | ORAL_CAPSULE | Freq: Two times a day (BID) | ORAL | 0 refills | Status: DC

## 2024-01-21 NOTE — Telephone Encounter (Signed)
 Tikosyn  refilled, pt sch for f/u in Nov.

## 2024-02-06 NOTE — Progress Notes (Signed)
 Patient ID: Donald Grant, male   DOB: [birth year], 48 y.o.   MRN: [id number] PCP: Dr. Windy EP: Dr. Nancey HF Cardiology: Dr. Rolan  47 y.o. with history of cardiomyopathy, heavy ETOH use, paroxysmal atrial fibrillation and flutter with bradycardia, and frequent PVCs.  Patient has a long EP history.  He has paroxysmal atrial fibrillation with slow ventricular response.  He had atrial flutter ablation in 2012 and also has atrial tachycardia with focus near the AV node that has not been ablated.  He had another ablation of atypical atrial flutter in 4/17.  He had a holter in 5/15 showing a 3.8 second pause, predominant atrial fibrillation, and 39% PVCs (some of this was likely atrial fibrillation with aberrancy).  He had runs of NSVT.  He is now on dofetilide . He had a Medtronic CRT-D device placed with his bundle lead.  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  Cardiac MRI in 8/15 did not show evidence for ARVC.    He was referred to CHF clinic initially for management of his cardiomyopathy.  Echo in 7/15 suggested EF 30% but difficult to quantify due to PVCs.  Cardiac MRI in 8/15 showed EF 45% with normal RV and a nonspecific RV insertion site delayed enhancement pattern.  He stopped ETOH completely.  Echo in 2/18 showed EF 50-55%.  Echo in 5/20 showed EF 45-50%, normal RV.  Echo in 3/22 showed worsening LV function with EF 35-40%, mild LV dilation, mild LVH, normal RV.  Patient was drinking more.   Echo in 7/22 showed EF 40-45%, mild LV dilation, normal RV, PASP 24 mmHg, normal IVC.    Echo in 1/24 showed EF 45%, global hypokinesis, normal RV.   Today he returns for HF follow up. Overall feeling fine. No SOB with activity. He feels rare palpitations, not bothersome. Denies abnormal bleeding, CP, dizziness, edema, or PND/Orthopnea. Appetite ok. He is not weighing at home. Taking all medications. Plays guitar and sings. No tobacco use, drinks 6-8  beers 5 days a week, occasional THC.   Echo today 02/12/24 EF ~ 45% on my read. Official MD interpretation pending.   Medtronic device interrogation personally reviewed) : Stable thoracic impedence, 99% his bundle pacing, rare short AF/AT bursts, no VT, 2.4 hr/day activity.   ECG (personally reviewed):   Labs (10/23): K 3.8, creatinine 0.61, hgb 14.3, LDL 54 Labs (2/24): K 3.6, creatinine 0.68, hgb 13.8 Labs (9/24): K 4.0, creatinine 0.73, LDL 48 Labs (1/25): K 4.0, creatinine 0.65  PMH: 1. History of  heavy ETOH use.  2. Atrial fibrillation: Paroxysmal.  CHADSVASC = 2, he is on Pradaxa .   3. HTN 4. Atrial flutter: s/p ablation 11/12.  He had ablation of atypical atrial flutter again in 4/17.  5. Atrial tachycardia: Mapped to AV node, not ablated.  6. Ventricular arrhythmias: NSVT, also very frequent PVCs. Some of beats that seem to be PVCs are likely aberrantly conducted atrial fibrillation.  Holter (5/15) with 3.8 second pause, atrial fibrillation predominantly, 39% PVCs (some of this likely is atrial fibrillation with aberrancy), runs of WCT (suspected NSVT).  7. Cardiomyopathy: Echo (7/15) with EF 30%, mild diffuse hypokinesis, mild LV dilation.  Cardiac MRI (8/15) with moderate LV dilation, EF 45%, diffuse hypokinesis, normal RV with no evidence for ARVC, delayed enhancement at the RV insertion sites which is a nonspecific pattern.  Lexiscan  Cardiolite  (5/15) with mild scar in the inferior and inferoseptal walls, no ischemia, not gated  due to PVCs.  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   - Medtronic CRT-D with His bundle lead.  - Echo (5/20): EF 45-50%, normal RV. - Echo (3/22): EF 35-40%, mild LV dilation/mild LVH, normal RV.  - Echo (7/22): EF 40-45%, mild LV dilation, normal RV, PASP 24 mmHg, normal IVC - Echo (2/24): EF 45%, global hypokinesis, normal RV.  8. Gout 9. 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  No  tobacco.  Occasional marijuana.  Never used cocaine.  Heavy ETOH use in the past.  FH: Mother with SCD in her 6s and dilated cardiomyopathy.  Maternal grandmother with SCD in her 84s, great-aunt with SCD, sister with atrial fibrillation and cardiomyopathy.    ROS: All systems reviewed and negative except as per HPI.   Current Outpatient Medications  Medication Sig Dispense Refill   acyclovir  (ZOVIRAX ) 400 MG tablet Take 400 mg by mouth 2 (two) times daily as needed (outbreak).      allopurinol  (ZYLOPRIM ) 300 MG tablet Take 300 mg by mouth daily.      ALPRAZolam  (XANAX ) 0.5 MG tablet Take 0.25 tablets by mouth daily as needed for anxiety.   1   apixaban  (ELIQUIS ) 5 MG TABS tablet Take 1 tablet (5 mg total) by mouth 2 (two) times daily. 180 tablet 3   B Complex-C (B-COMPLEX WITH VITAMIN C) tablet Take 1 tablet by mouth at bedtime.     calcium  carbonate (TUMS - DOSED IN MG ELEMENTAL CALCIUM ) 500 MG chewable tablet Chew 2-3 tablets by mouth daily as needed for indigestion or heartburn.     carvedilol  (COREG ) 25 MG tablet Take 1 tablet (25 mg total) by mouth 2 (two) times daily. PLEASE SCHEDULE APPOINTMENT FOR MORE REFILLS CALL [phone] OPTION 2 180 tablet 1   Cholecalciferol  (VITAMIN D ) 2000 units CAPS Take 4,000 Units by mouth in the morning and at bedtime.      dapagliflozin  propanediol (FARXIGA ) 10 MG TABS tablet Take 1 tablet (10 mg total) by mouth daily. 90 tablet 3   dofetilide  (TIKOSYN ) 500 MCG capsule TAKE 1 CAPSULE BY MOUTH 2 TIMES DAILY. 180 capsule 1   fish oil-omega-3 fatty acids 1000 MG capsule Take 2,000 mg by mouth 2 (two) times daily.     magnesium  oxide (MAG-OX) 400 MG tablet TAKE 1 TABLET BY MOUTH EVERY DAY 90 tablet 3   Multiple Vitamin (MULITIVITAMIN WITH MINERALS) TABS Take 1 tablet by mouth every evening.     Psyllium (METAMUCIL PO) Take 8.4 g by mouth 3 (three) times a week. Use 2 teaspoons into water once as needed     rosuvastatin  (CRESTOR ) 40 MG tablet Take 1 tablet  (40 mg total) by mouth every evening. 90 tablet 3   sacubitril -valsartan  (ENTRESTO ) 97-103 MG Take 1 tablet by mouth 2 (two) times daily. 180 tablet 3   spironolactone  (ALDACTONE ) 25 MG tablet TAKE 1 TABLET BY MOUTH EVERY DAY 90 tablet 0   No current facility-administered medications for this encounter.   Wt Readings from Last 3 Encounters:  02/12/24 115.2 kg (254 lb)  12/19/22 117.4 kg (258 lb 12.8 oz)  09/15/22 117.9 kg (260 lb)   BP 126/76   Pulse 75   Ht 6' 2 (1.88 m)   Wt 115.2 kg (254 lb)   SpO2 95%   BMI 32.61 kg/m   Physical Exam: General:  NAD. No resp difficulty, walked into clinic HEENT: Normal Neck: Supple. No JVD. Cor: Regular rate &  rhythm. No rubs, gallops or murmurs. Lungs: Clear Abdomen: Soft, nontender, nondistended.  Extremities: No cyanosis, clubbing, rash, edema Neuro: Alert & oriented x 3, moves all 4 extremities w/o difficulty. Affect pleasant.   Assessment/Plan:  1. 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  He has a Medtronic ICD with His bundle lead.  Of note, heavy ETOH likely contributed to cardiomyopathy.  Echo in 3/22 showed EF down to 35-40% in the setting of more heavy ETOH use.  Echo in 7/22 showed some improvement with EF 40-45%, normal RV.  Echo in 1/24 showed stable to improved EF, 45%. Echo today EF ~ 45%. NYHA class I currrently, not volume overloaded by exam or Optivol.    - Needs to keep ETOH at minimum.  - Continue Coreg  25 mg bid.    - Continue Entresto  97/103 bid. BMET today. - Continue spironolactone  25 mg daily.  - Continue dapagliflozin  10 mg daily.   2. Atrial fibrillation: Paroxysmal atrial fibrillation with bradycardic ventricular response, now with His bundle pacing.  He is a-paced today.   - Continue  Eliquis  5 mg bid. No bleeding issues. CBC today.  - Continue dofetilide  500 mcg per EP, QTc a bit longer on ECG today, 539 msec.  Check Mag and BMET today. Will reach out to Dr. Nancey, he has follow up with him next month. - Needs to cut back on ETOH as much as possible as heavy ETOH will increase risk of recurrent atrial fibrillation.  3. Atrial flutter: s/p ablation most recently in 4/17.   4. Atrial tachycardia: Prior history, mapped to area near AV node.  5. 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  6. PVCs/NSVT: He has an ICD and is on dofetilide .   7. Hyperlipidemia: Continue rosuvastatin  40 mg daily. Check lipids/LFTs today.   Follow up in 6 months with Dr. Rolan Raisin [facility] FNP-BC 02/12/2024

## 2024-02-08 NOTE — Telephone Encounter (Signed)
 Call x1; [email or web address] to schedule patient from recall w/ [employer] for 1 year f/u (due June 2025); MDT ICD.

## 2024-02-11 ENCOUNTER — Telehealth (HOSPITAL_COMMUNITY): Payer: Self-pay

## 2024-02-11 NOTE — Telephone Encounter (Signed)
 Called to confirm/remind patient of their appointment at the [facility] on 02/12/24.   Appointment:   [x] Confirmed  [] Left mess   [] No answer/No voice mail  [] VM Full/unable to leave message  [] Phone not in service  Patient reminded to bring all medications and/or complete list.  Confirmed patient has transportation. Gave directions, instructed to utilize valet parking.

## 2024-02-12 ENCOUNTER — Encounter (HOSPITAL_COMMUNITY): Payer: Self-pay

## 2024-02-12 ENCOUNTER — Ambulatory Visit (HOSPITAL_COMMUNITY): Payer: Self-pay | Admitting: Cardiology

## 2024-02-12 ENCOUNTER — Other Ambulatory Visit (HOSPITAL_COMMUNITY): Payer: Self-pay | Admitting: Cardiology

## 2024-02-12 ENCOUNTER — Ambulatory Visit (HOSPITAL_BASED_OUTPATIENT_CLINIC_OR_DEPARTMENT_OTHER)
Admission: RE | Admit: 2024-02-12 | Discharge: 2024-02-12 | Disposition: A | Discharge status: Home / Self Care | Source: Ambulatory Visit | Attending: Family Medicine | Admitting: Family Medicine

## 2024-02-12 ENCOUNTER — Ambulatory Visit (HOSPITAL_COMMUNITY)
Admission: RE | Admit: 2024-02-12 | Discharge: 2024-02-12 | Disposition: A | Discharge status: Home / Self Care | Source: Ambulatory Visit | Attending: Cardiology | Admitting: Cardiology

## 2024-02-12 ENCOUNTER — Ambulatory Visit (HOSPITAL_COMMUNITY): Payer: Self-pay | Admitting: Family Medicine

## 2024-02-12 VITALS — BP 126/76 | HR 75 | Ht 74.0 in | Wt 254.0 lb

## 2024-02-12 DIAGNOSIS — Z8241 Family history of sudden cardiac death: Secondary | ICD-10-CM | POA: Diagnosis not present

## 2024-02-12 DIAGNOSIS — I4719 Other supraventricular tachycardia: Secondary | ICD-10-CM

## 2024-02-12 DIAGNOSIS — I472 Ventricular tachycardia, unspecified: Secondary | ICD-10-CM | POA: Insufficient documentation

## 2024-02-12 DIAGNOSIS — I4892 Unspecified atrial flutter: Secondary | ICD-10-CM | POA: Diagnosis not present

## 2024-02-12 DIAGNOSIS — Z7901 Long term (current) use of anticoagulants: Secondary | ICD-10-CM | POA: Insufficient documentation

## 2024-02-12 DIAGNOSIS — Z95810 Presence of automatic (implantable) cardiac defibrillator: Secondary | ICD-10-CM | POA: Insufficient documentation

## 2024-02-12 DIAGNOSIS — Z79899 Other long term (current) drug therapy: Secondary | ICD-10-CM | POA: Diagnosis not present

## 2024-02-12 DIAGNOSIS — E785 Hyperlipidemia, unspecified: Secondary | ICD-10-CM | POA: Diagnosis not present

## 2024-02-12 DIAGNOSIS — I480 Paroxysmal atrial fibrillation: Secondary | ICD-10-CM | POA: Diagnosis not present

## 2024-02-12 DIAGNOSIS — I493 Ventricular premature depolarization: Secondary | ICD-10-CM | POA: Diagnosis not present

## 2024-02-12 DIAGNOSIS — I5022 Chronic systolic (congestive) heart failure: Secondary | ICD-10-CM

## 2024-02-12 DIAGNOSIS — I429 Cardiomyopathy, unspecified: Secondary | ICD-10-CM | POA: Insufficient documentation

## 2024-02-12 DIAGNOSIS — R001 Bradycardia, unspecified: Secondary | ICD-10-CM | POA: Insufficient documentation

## 2024-02-12 LAB — ECHOCARDIOGRAM COMPLETE
Area-P 1/2: 3.33 cm2
Calc EF: 32.2 %
S' Lateral: 5.3 cm
Single Plane A2C EF: 32.4 %
Single Plane A4C EF: 26.3 %

## 2024-02-12 LAB — COMPREHENSIVE METABOLIC PANEL WITH GFR
ALT: 35 U/L (ref 0–44)
AST: 36 U/L (ref 15–41)
Albumin: 4.3 g/dL (ref 3.5–5.0)
Alkaline Phosphatase: 44 U/L (ref 38–126)
Anion gap: 11 (ref 5–15)
BUN: 6 mg/dL (ref 6–20)
CO2: 26 mmol/L (ref 22–32)
Calcium: 9 mg/dL (ref 8.9–10.3)
Chloride: 99 mmol/L (ref 98–111)
Creatinine, Ser: 0.67 mg/dL (ref 0.61–1.24)
GFR, Estimated: >60 mL/min (ref >60)
Glucose, Bld: 117 mg/dL — ABNORMAL HIGH (ref 70–99)
Potassium: 4.1 mmol/L (ref 3.5–5.1)
Sodium: 136 mmol/L (ref 135–145)
Total Bilirubin: 1.5 mg/dL — ABNORMAL HIGH (ref 0.0–1.2)
Total Protein: 7 g/dL (ref 6.5–8.1)

## 2024-02-12 LAB — LIPID PANEL
Cholesterol: 128 mg/dL (ref 0–200)
HDL: 48 mg/dL (ref >40)
LDL Cholesterol: 38 mg/dL (ref 0–99)
Total CHOL/HDL Ratio: 2.7 ratio
Triglycerides: 209 mg/dL — ABNORMAL HIGH (ref <150)
VLDL: 42 mg/dL — ABNORMAL HIGH (ref 0–40)

## 2024-02-12 LAB — CBC
HCT: 43.6 % (ref 39.0–52.0)
Hemoglobin: 15.2 g/dL (ref 13.0–17.0)
MCH: 32.6 pg (ref 26.0–34.0)
MCHC: 34.9 g/dL (ref 30.0–36.0)
MCV: 93.6 fL (ref 80.0–100.0)
Platelets: 180 K/uL (ref 150–400)
RBC: 4.66 MIL/uL (ref 4.22–5.81)
RDW: 12.3 % (ref 11.5–15.5)
WBC: 6.9 K/uL (ref 4.0–10.5)
nRBC: 0 % (ref 0.0–0.2)

## 2024-02-12 LAB — MAGNESIUM: Magnesium: 2.2 mg/dL (ref 1.7–2.4)

## 2024-02-12 MED ORDER — DAPAGLIFLOZIN PROPANEDIOL 10 MG PO TABS: 10.0000 mg | ORAL_TABLET | Freq: Every day | ORAL | 3 refills | Status: AC

## 2024-02-12 MED ORDER — SPIRONOLACTONE 25 MG PO TABS: 25.0000 mg | ORAL_TABLET | Freq: Every day | ORAL | 3 refills | Status: AC

## 2024-02-12 MED ORDER — SACUBITRIL-VALSARTAN 97-103 MG PO TABS: 1.0000 | ORAL_TABLET | Freq: Two times a day (BID) | ORAL | 3 refills | Status: AC

## 2024-02-12 MED ORDER — CARVEDILOL 25 MG PO TABS: 25.0000 mg | ORAL_TABLET | Freq: Two times a day (BID) | ORAL | 3 refills | Status: AC

## 2024-02-12 MED ORDER — ROSUVASTATIN CALCIUM 40 MG PO TABS: 40.0000 mg | ORAL_TABLET | Freq: Every evening | ORAL | 3 refills | Status: AC

## 2024-02-12 NOTE — Telephone Encounter (Signed)
 Patient returned my call yesterday 11/17 - he is scheduled for 12/23 with Dr. Nancey.

## 2024-02-12 NOTE — Patient Instructions (Signed)
 Medication Changes:  MEDICATIONS REFILLED   Lab Work:  Labs done today, your results will be available in MyChart, we will contact you for abnormal readings.  Follow-Up in: 6 MONTHS WITH DR. ROLAN PLEASE CALL OUR OFFICE AROUND MARCH 2026 TO GET SCHEDULED FOR YOUR APPOINTMENT. PHONE NUMBER IS [phone] OPTION 2   At the [facility], you and your health needs are our priority. We have a designated team specialized in the treatment of Heart Failure. This Care Team includes your primary Heart Failure Specialized Cardiologist (physician), Advanced Practice Providers (APPs- Physician Assistants and Nurse Practitioners), and Pharmacist who all work together to provide you with the care you need, when you need it.   You may see any of the following providers on your designated Care Team at your next follow up:  Dr. Toribio Fuel Dr. Ezra Rolan Dr. Odis Brownie Greig Mosses, NP Caffie Shed, [place] [facility] [place], [place] Beckey Coe, NP Jordan Lee, NP Tinnie Redman, PharmD   Please be sure to bring in all your medications bottles to every appointment.   Need to Contact Us :  If you have any questions or concerns before your next appointment please send us  a message through [place] or call our office at [phone].    TO LEAVE A MESSAGE FOR THE NURSE SELECT OPTION 2, PLEASE LEAVE A MESSAGE INCLUDING: YOUR NAME DATE OF BIRTH CALL BACK NUMBER REASON FOR CALL**this is important as we prioritize the call backs  YOU WILL RECEIVE A CALL BACK THE SAME DAY AS LONG AS YOU CALL BEFORE 4:00 PM

## 2024-02-13 ENCOUNTER — Telehealth (HOSPITAL_COMMUNITY): Payer: Self-pay

## 2024-02-13 MED ORDER — DOFETILIDE 250 MCG PO CAPS: 250.0000 ug | ORAL_CAPSULE | Freq: Two times a day (BID) | ORAL | 3 refills | Status: DC

## 2024-02-13 MED ORDER — DOFETILIDE 500 MCG PO CAPS: 500.0000 ug | ORAL_CAPSULE | Freq: Two times a day (BID) | ORAL | 11 refills | Status: AC

## 2024-02-13 NOTE — Addendum Note (Signed)
 Encounter addended by: Glena Harlene HERO, FNP on: 02/13/2024 8:42 AM  Actions taken: Clinical Note Signed

## 2024-02-13 NOTE — Telephone Encounter (Signed)
-----   Message from Ezra Shuck sent at 02/12/2024 10:54 PM EST ----- EF 35-40%, a little lower than prior.  Keep followup.  ----- Message ----- From: Interface, [employer] Sent: 02/12/2024   5:57 PM EST To: Ezra GORMAN Shuck, MD

## 2024-02-13 NOTE — Addendum Note (Signed)
 Encounter addended by: Glena Harlene HERO, FNP on: 02/13/2024 4:47 PM  Actions taken: Clinical Note Signed

## 2024-02-13 NOTE — Telephone Encounter (Signed)
 Patient called to request call from Dr Rolan  Reports he received a call about echo, reports information given at [facility] was different  Would ike to speak directly to Dr Rolan

## 2024-02-13 NOTE — Addendum Note (Signed)
 Encounter addended by: Glena Harlene HERO, FNP on: 02/13/2024 4:46 PM  Actions taken: Clinical Note Signed

## 2024-02-13 NOTE — Addendum Note (Signed)
 Addended by: Awad Gladd, DALTON HERO on: 02/13/2024 04:50 PM   Modules accepted: Orders

## 2024-02-13 NOTE — Addendum Note (Signed)
 Encounter addended by: Glena Harlene HERO, FNP on: 02/13/2024 11:36 AM  Actions taken: Clinical Note Signed

## 2024-02-13 NOTE — Telephone Encounter (Addendum)
 Spoke to patient made aware to decrease Tikosyn   to 250 mcg Twice daily Follow up appointment made for EKG.   Made aware of echocardiogram results.    ----- Message from Ezra Shuck sent at 02/12/2024 10:54 PM EST ----- EF 35-40%, a little lower than prior.  Keep followup.  ----- Message ----- From: Interface, [employer] Sent: 02/12/2024   5:57 PM EST To: Ezra GORMAN Shuck, MD

## 2024-02-27 ENCOUNTER — Ambulatory Visit (HOSPITAL_COMMUNITY)

## 2024-03-17 ENCOUNTER — Encounter: Payer: Self-pay | Admitting: Cardiology

## 2024-03-18 ENCOUNTER — Ambulatory Visit: Attending: Cardiovascular Disease | Admitting: Cardiovascular Disease

## 2024-03-18 ENCOUNTER — Encounter: Payer: Self-pay | Admitting: Cardiovascular Disease

## 2024-03-18 ENCOUNTER — Ambulatory Visit: Payer: 59

## 2024-03-18 VITALS — BP 124/84 | HR 83 | Ht 74.0 in | Wt 252.0 lb

## 2024-03-18 DIAGNOSIS — I4819 Other persistent atrial fibrillation: Secondary | ICD-10-CM

## 2024-03-18 DIAGNOSIS — I440 Atrioventricular block, first degree: Secondary | ICD-10-CM | POA: Diagnosis not present

## 2024-03-18 DIAGNOSIS — I5022 Chronic systolic (congestive) heart failure: Secondary | ICD-10-CM | POA: Diagnosis not present

## 2024-03-18 DIAGNOSIS — I493 Ventricular premature depolarization: Secondary | ICD-10-CM | POA: Diagnosis not present

## 2024-03-18 DIAGNOSIS — Z95810 Presence of automatic (implantable) cardiac defibrillator: Secondary | ICD-10-CM | POA: Diagnosis not present

## 2024-03-18 DIAGNOSIS — I4892 Unspecified atrial flutter: Secondary | ICD-10-CM

## 2024-03-18 DIAGNOSIS — I495 Sick sinus syndrome: Secondary | ICD-10-CM | POA: Diagnosis not present

## 2024-03-18 DIAGNOSIS — I4719 Other supraventricular tachycardia: Secondary | ICD-10-CM

## 2024-03-18 LAB — CUP PACEART INCLINIC DEVICE CHECK
Battery Remaining Longevity: 21 mo
Battery Voltage: 2.93 V
Brady Statistic AP VP Percent: 99.85 %
Brady Statistic AP VS Percent: 0.12 %
Brady Statistic AS VP Percent: 0.03 %
Brady Statistic AS VS Percent: 0 %
Brady Statistic RA Percent Paced: 99.92 %
Brady Statistic RV Percent Paced: 99.2 %
Date Time Interrogation Session: 20251223132526
HighPow Impedance: 66 Ohm
Implantable Lead Connection Status: 753985
Implantable Lead Connection Status: 753985
Implantable Lead Connection Status: 753985
Implantable Lead Implant Date: 20160628
Implantable Lead Implant Date: 20160628
Implantable Lead Implant Date: 20160628
Implantable Lead Location: 753858
Implantable Lead Location: 753859
Implantable Lead Location: 753860
Implantable Lead Model: 3830
Implantable Lead Model: 5076
Implantable Pulse Generator Implant Date: 20211124
Lead Channel Impedance Value: 228 Ohm
Lead Channel Impedance Value: 285 Ohm
Lead Channel Impedance Value: 418 Ohm
Lead Channel Impedance Value: 456 Ohm
Lead Channel Impedance Value: 456 Ohm
Lead Channel Impedance Value: 513 Ohm
Lead Channel Pacing Threshold Amplitude: 0.5 V
Lead Channel Pacing Threshold Amplitude: 0.75 V
Lead Channel Pacing Threshold Amplitude: 0.75 V
Lead Channel Pacing Threshold Pulse Width: 0.4 ms
Lead Channel Pacing Threshold Pulse Width: 0.4 ms
Lead Channel Pacing Threshold Pulse Width: 0.8 ms
Lead Channel Setting Pacing Amplitude: 0.5 V
Lead Channel Setting Pacing Amplitude: 2 V
Lead Channel Setting Pacing Amplitude: 2.5 V
Lead Channel Setting Pacing Pulse Width: 0.03 ms
Lead Channel Setting Pacing Pulse Width: 0.8 ms
Lead Channel Setting Sensing Sensitivity: 0.3 mV

## 2024-03-18 NOTE — Patient Instructions (Signed)
 Medication Instructions:  Your physician recommends that you continue on your current medications as directed. Please refer to the Current Medication list given to you today.  *If you need a refill on your cardiac medications before your next appointment, please call your pharmacy*  Lab Work: none If you have labs (blood work) drawn today and your tests are completely normal, you will receive your results only by: MyChart Message (if you have MyChart) OR A paper copy in the mail If you have any lab test that is abnormal or we need to change your treatment, we will call you to review the results.  Testing/Procedures: none  Follow-Up: At [facility], you and your health needs are our priority.  As part of our continuing mission to provide you with exceptional heart care, our providers are all part of one team.  This team includes your primary Cardiologist (physician) and Advanced Practice Providers or APPs (Physician Assistants and Nurse Practitioners) who all work together to provide you with the care you need, when you need it.  Your next appointment:   6 month(s)  Provider:   You will follow up in the [facility]  Your provider will be: Clint R. Fenton, PA-C or Fairy Heinrich, PA-C    We recommend signing up for the patient portal called MyChart.  Sign up information is provided on this After Visit Summary.  MyChart is used to connect with patients for Virtual Visits (Telemedicine).  Patients are able to view lab/test results, encounter notes, upcoming appointments, etc.  Non-urgent messages can be sent to your provider as well.   To learn more about what you can do with MyChart, go to [email or web address].   Other Instructions

## 2024-03-18 NOTE — Progress Notes (Signed)
 " Electrophysiology Office Note:    Date:  03/18/2024   ID:  Donald, Grant December 30, 1975, MRN [id number]  PCP:  Windy Coy, MD   [facility] Providers Cardiologist:  None Cardiology APP:  Arloa Doyal Browning, NP  Electrophysiologist:  Eulas FORBES Furbish, MD     Referring MD: Windy Coy, MD   History of Present Illness:    Donald Grant is a 48 y.o. male with a medical history significant for paroxysmal atrial fibrillation and flutter, bradycardia, frequent PVCs, who presents for EP follow-ip.     I reviewed noted from Drs Kelsie and Rolan.  He has a complicated EP history.   He has had visual atrial fibrillation with slow ventricular response.  He had on an atrial flutter ablation in 2012 with a repeat ablation in April 2017.  He has an atrial tachycardia that mapped to the vicinity of the AV node that was not ablated.  Holter in 2015 showed 3.8-second pause, predominant atrial fibrillation.  It also showed a 39% burden of PVCs though much of this is likely aberrant conduction of A-fib.  He has a Medtronic CRT-D device implanted at [facility].  The LV lead is positioned at the HIS, and the RV lead is programmed subthreshold.  There is a strong family history of sudden cardiac death associated with LM and a mutation.  He has had a cardiac MRI in 2015 that did not show any evidence of RV dysfunction.  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  After cessation of alcohol, his EF improved to 50%.  His EF declined back to 35 to 40% in 2022 after he resumed drinking    Today, he reports that he is doing very well.  He has not had any symptoms to suggest recurrence of atrial fibrillation or VT. he has no device related complaints -- no new tenderness, drainage, redness.   EKGs/Labs/Other Studies Reviewed Today:    Echocardiogram:  05/23/2022 TTE EF 45% normal RV structure and function   Monitors:   Stress testing:   Advanced  imaging:   Cardiac catherization   EKG:   EKG Interpretation Date/Time:  Tuesday March 18 2024 11:25:28 EST Ventricular Rate:  83 PR Interval:  176 QRS Duration:  188 QT Interval:  458 QTC Calculation: 538 R Axis:   -6  Text Interpretation: AV dual-paced rhythm with occasional ventricular-paced complexes When compared with ECG of 12-Feb-2024 10:44, Vent. rate has increased BY   9 BPM Confirmed by Furbish Eulas [phone]) on 03/18/2024 11:56:04 AM     Physical Exam:    VS:  BP 124/84 (BP Location: Left Arm, Patient Position: Sitting, Cuff Size: Large)   Pulse 83   Ht 6' 2 (1.88 m)   Wt 252 lb (114.3 kg)   SpO2 97%   BMI 32.35 kg/m     Wt Readings from Last 3 Encounters:  03/18/24 252 lb (114.3 kg)  02/12/24 254 lb (115.2 kg)  12/19/22 258 lb 12.8 oz (117.4 kg)     GEN:  Well nourished, well developed in no acute distress CARDIAC: RRR, no murmurs, rubs, gallops RESPIRATORY:  Normal work of breathing MUSCULOSKELETAL: no edema    ASSESSMENT & PLAN:    Complete heart block Medtronic CRT-D device functioning normally.  I reviewed the interrogation in detail today.  See Paceart His device has a HIS lead in the LV port, and the RV lead is programmed subthreshold He is device dependent today in both the atrium  and the ventricle.  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  QTc is appropriate when corrected for ventricular pacing wide QRS  PVCs BiV Paced 98%, indicating low PVC burden  VT No events recently    Signed, Eulas FORBES Furbish, MD  03/18/2024 11:56 AM    [facility] "

## 2024-03-19 LAB — CUP PACEART REMOTE DEVICE CHECK
Battery Remaining Longevity: 21 mo
Battery Voltage: 2.93 V
Brady Statistic AP VP Percent: 99.79 %
Brady Statistic AP VS Percent: 0.19 %
Brady Statistic AS VP Percent: 0.02 %
Brady Statistic AS VS Percent: 0 %
Brady Statistic RA Percent Paced: 99.97 %
Brady Statistic RV Percent Paced: 99.3 %
Date Time Interrogation Session: 20251223224238
HighPow Impedance: 76 Ohm
Implantable Lead Connection Status: 753985
Implantable Lead Connection Status: 753985
Implantable Lead Connection Status: 753985
Implantable Lead Implant Date: 20160628
Implantable Lead Implant Date: 20160628
Implantable Lead Implant Date: 20160628
Implantable Lead Location: 753858
Implantable Lead Location: 753859
Implantable Lead Location: 753860
Implantable Lead Model: 3830
Implantable Lead Model: 5076
Implantable Pulse Generator Implant Date: 20211124
Lead Channel Impedance Value: 247 Ohm
Lead Channel Impedance Value: 285 Ohm
Lead Channel Impedance Value: 418 Ohm
Lead Channel Impedance Value: 456 Ohm
Lead Channel Impedance Value: 475 Ohm
Lead Channel Impedance Value: 513 Ohm
Lead Channel Pacing Threshold Amplitude: 0.875 V
Lead Channel Pacing Threshold Pulse Width: 0.8 ms
Lead Channel Sensing Intrinsic Amplitude: 1.375 mV
Lead Channel Sensing Intrinsic Amplitude: 1.375 mV
Lead Channel Sensing Intrinsic Amplitude: 2.25 mV
Lead Channel Sensing Intrinsic Amplitude: 2.25 mV
Lead Channel Setting Pacing Amplitude: 0.5 V
Lead Channel Setting Pacing Amplitude: 2 V
Lead Channel Setting Pacing Amplitude: 2.5 V
Lead Channel Setting Pacing Pulse Width: 0.03 ms
Lead Channel Setting Pacing Pulse Width: 0.8 ms
Lead Channel Setting Sensing Sensitivity: 0.3 mV

## 2024-03-21 NOTE — Progress Notes (Signed)
 Remote ICD Transmission

## 2024-03-26 ENCOUNTER — Ambulatory Visit: Payer: Self-pay | Admitting: Cardiovascular Disease
